# Patient Record
Sex: Female | Born: 1940 | Race: White | Hispanic: No | Smoking: Never smoker
Health system: Southern US, Community
[De-identification: ages and names within clinical notes are randomized; demographics above are authoritative.]

## PROBLEM LIST (undated history)

## (undated) DIAGNOSIS — E119 Type 2 diabetes mellitus without complications: Secondary | ICD-10-CM

## (undated) DIAGNOSIS — I10 Essential (primary) hypertension: Secondary | ICD-10-CM

## (undated) DIAGNOSIS — Z974 Presence of external hearing-aid: Secondary | ICD-10-CM

## (undated) DIAGNOSIS — H9190 Unspecified hearing loss, unspecified ear: Secondary | ICD-10-CM

## (undated) DIAGNOSIS — E785 Hyperlipidemia, unspecified: Secondary | ICD-10-CM

## (undated) DIAGNOSIS — I214 Non-ST elevation (NSTEMI) myocardial infarction: Secondary | ICD-10-CM

## (undated) DIAGNOSIS — R51 Headache: Secondary | ICD-10-CM

## (undated) DIAGNOSIS — Z972 Presence of dental prosthetic device (complete) (partial): Secondary | ICD-10-CM

## (undated) DIAGNOSIS — M1990 Unspecified osteoarthritis, unspecified site: Secondary | ICD-10-CM

## (undated) DIAGNOSIS — R519 Headache, unspecified: Secondary | ICD-10-CM

## (undated) DIAGNOSIS — N189 Chronic kidney disease, unspecified: Secondary | ICD-10-CM

## (undated) DIAGNOSIS — I509 Heart failure, unspecified: Secondary | ICD-10-CM

## (undated) HISTORY — DX: Chronic kidney disease, unspecified: N18.9

## (undated) HISTORY — DX: Non-ST elevation (NSTEMI) myocardial infarction: I21.4

## (undated) HISTORY — DX: Essential (primary) hypertension: I10

## (undated) HISTORY — PX: INJECTION KNEE: SHX2446

## (undated) HISTORY — DX: Heart failure, unspecified: I50.9

## (undated) HISTORY — DX: Hyperlipidemia, unspecified: E78.5

## (undated) HISTORY — PX: ABDOMINAL HYSTERECTOMY: SHX81

## (undated) HISTORY — PX: CARDIAC CATHETERIZATION: SHX172

## (undated) HISTORY — DX: Unspecified osteoarthritis, unspecified site: M19.90

## (undated) HISTORY — PX: BREAST BIOPSY: SHX20

---

## 1984-04-21 HISTORY — PX: ABDOMINAL HYSTERECTOMY: SHX81

## 2001-03-29 ENCOUNTER — Other Ambulatory Visit: Admission: RE | Admit: 2001-03-29 | Discharge: 2001-03-29 | Payer: Self-pay | Admitting: Family Medicine

## 2007-12-13 ENCOUNTER — Ambulatory Visit: Payer: Self-pay | Admitting: Family Medicine

## 2007-12-21 ENCOUNTER — Ambulatory Visit: Payer: Self-pay | Admitting: Family Medicine

## 2008-01-20 ENCOUNTER — Ambulatory Visit: Payer: Self-pay | Admitting: Family Medicine

## 2014-07-11 ENCOUNTER — Emergency Department (HOSPITAL_COMMUNITY): Admission: EM | Admit: 2014-07-11 | Discharge: 2014-07-11 | Discharge status: Absent without leave | Payer: Self-pay

## 2014-09-21 ENCOUNTER — Encounter
Admission: RE | Admit: 2014-09-21 | Discharge: 2014-09-21 | Disposition: A | Discharge status: Home / Self Care | Payer: PPO | Source: Ambulatory Visit | Attending: Ophthalmology | Admitting: Ophthalmology

## 2014-09-21 DIAGNOSIS — Z01810 Encounter for preprocedural cardiovascular examination: Secondary | ICD-10-CM | POA: Insufficient documentation

## 2014-09-21 DIAGNOSIS — I10 Essential (primary) hypertension: Secondary | ICD-10-CM | POA: Insufficient documentation

## 2014-09-21 DIAGNOSIS — H2511 Age-related nuclear cataract, right eye: Secondary | ICD-10-CM | POA: Insufficient documentation

## 2014-09-27 ENCOUNTER — Encounter: Payer: Self-pay | Admitting: *Deleted

## 2014-09-27 DIAGNOSIS — E119 Type 2 diabetes mellitus without complications: Secondary | ICD-10-CM | POA: Diagnosis not present

## 2014-09-27 DIAGNOSIS — H2511 Age-related nuclear cataract, right eye: Secondary | ICD-10-CM | POA: Diagnosis not present

## 2014-09-27 DIAGNOSIS — E780 Pure hypercholesterolemia: Secondary | ICD-10-CM | POA: Diagnosis not present

## 2014-09-27 DIAGNOSIS — G43909 Migraine, unspecified, not intractable, without status migrainosus: Secondary | ICD-10-CM | POA: Diagnosis not present

## 2014-09-27 DIAGNOSIS — Z885 Allergy status to narcotic agent status: Secondary | ICD-10-CM | POA: Diagnosis not present

## 2014-09-27 DIAGNOSIS — Z90710 Acquired absence of both cervix and uterus: Secondary | ICD-10-CM | POA: Diagnosis not present

## 2014-09-27 DIAGNOSIS — H9190 Unspecified hearing loss, unspecified ear: Secondary | ICD-10-CM | POA: Diagnosis not present

## 2014-10-03 ENCOUNTER — Encounter
Admission: RE | Disposition: A | Discharge status: Home / Self Care | Payer: Self-pay | Source: Ambulatory Visit | Attending: Ophthalmology

## 2014-10-03 ENCOUNTER — Ambulatory Visit
Admission: RE | Admit: 2014-10-03 | Discharge: 2014-10-03 | Disposition: A | Discharge status: Home / Self Care | Payer: PPO | Source: Ambulatory Visit | Attending: Ophthalmology | Admitting: Ophthalmology

## 2014-10-03 ENCOUNTER — Ambulatory Visit: Payer: PPO | Admitting: Certified Registered Nurse Anesthetist

## 2014-10-03 ENCOUNTER — Encounter: Payer: Self-pay | Admitting: Ophthalmology

## 2014-10-03 DIAGNOSIS — Z885 Allergy status to narcotic agent status: Secondary | ICD-10-CM | POA: Insufficient documentation

## 2014-10-03 DIAGNOSIS — G43909 Migraine, unspecified, not intractable, without status migrainosus: Secondary | ICD-10-CM | POA: Insufficient documentation

## 2014-10-03 DIAGNOSIS — H2511 Age-related nuclear cataract, right eye: Secondary | ICD-10-CM | POA: Insufficient documentation

## 2014-10-03 DIAGNOSIS — E119 Type 2 diabetes mellitus without complications: Secondary | ICD-10-CM | POA: Insufficient documentation

## 2014-10-03 DIAGNOSIS — E780 Pure hypercholesterolemia: Secondary | ICD-10-CM | POA: Insufficient documentation

## 2014-10-03 DIAGNOSIS — Z90710 Acquired absence of both cervix and uterus: Secondary | ICD-10-CM | POA: Insufficient documentation

## 2014-10-03 DIAGNOSIS — H9190 Unspecified hearing loss, unspecified ear: Secondary | ICD-10-CM | POA: Insufficient documentation

## 2014-10-03 HISTORY — DX: Headache: R51

## 2014-10-03 HISTORY — DX: Type 2 diabetes mellitus without complications: E11.9

## 2014-10-03 HISTORY — DX: Headache, unspecified: R51.9

## 2014-10-03 HISTORY — PX: CATARACT EXTRACTION W/PHACO: SHX586

## 2014-10-03 HISTORY — DX: Unspecified hearing loss, unspecified ear: H91.90

## 2014-10-03 LAB — GLUCOSE, CAPILLARY: Glucose-Capillary: 193 mg/dL — ABNORMAL HIGH (ref 65–99)

## 2014-10-03 SURGERY — PHACOEMULSIFICATION, CATARACT, WITH IOL INSERTION
Anesthesia: Monitor Anesthesia Care | Site: Eye | Laterality: Right | Wound class: Clean

## 2014-10-03 MED ORDER — NA CHONDROIT SULF-NA HYALURON 40-17 MG/ML IO SOLN
INTRAOCULAR | Status: AC
Start: 2014-10-03 — End: 2014-10-03
  Filled 2014-10-03: qty 1

## 2014-10-03 MED ORDER — EPINEPHRINE HCL 1 MG/ML IJ SOLN
INTRAOCULAR | Status: DC | PRN
  Administered 2014-10-03: 200 mL

## 2014-10-03 MED ORDER — TETRACAINE HCL 0.5 % OP SOLN
1.0000 [drp] | OPHTHALMIC | Status: DC | PRN
  Administered 2014-10-03: 1 [drp] via OPHTHALMIC

## 2014-10-03 MED ORDER — MOXIFLOXACIN HCL 0.5 % OP SOLN - NO CHARGE
OPHTHALMIC | Status: DC | PRN
  Administered 2014-10-03: 1 [drp] via OPHTHALMIC

## 2014-10-03 MED ORDER — LIDOCAINE HCL (PF) 4 % IJ SOLN
INTRAMUSCULAR | Status: AC
  Filled 2014-10-03: qty 5

## 2014-10-03 MED ORDER — CEFUROXIME OPHTHALMIC INJECTION 1 MG/0.1 ML
INJECTION | OPHTHALMIC | Status: DC | PRN
  Administered 2014-10-03: .1 mL via INTRACAMERAL

## 2014-10-03 MED ORDER — LIDOCAINE HCL (PF) 1 % IJ SOLN
INTRAOCULAR | Status: DC | PRN
  Administered 2014-10-03: .5 mL via OPHTHALMIC

## 2014-10-03 MED ORDER — POVIDONE-IODINE 5 % OP SOLN
1.0000 "application " | OPHTHALMIC | Status: AC | PRN
  Administered 2014-10-03: 1 via OPHTHALMIC

## 2014-10-03 MED ORDER — EPINEPHRINE HCL 1 MG/ML IJ SOLN
INTRAMUSCULAR | Status: AC
  Filled 2014-10-03: qty 1

## 2014-10-03 MED ORDER — FENTANYL CITRATE (PF) 100 MCG/2ML IJ SOLN
INTRAMUSCULAR | Status: DC | PRN
  Administered 2014-10-03: 50 ug via INTRAVENOUS

## 2014-10-03 MED ORDER — MIDAZOLAM HCL 2 MG/2ML IJ SOLN
INTRAMUSCULAR | Status: DC | PRN
  Administered 2014-10-03 (×2): 0.5 mg via INTRAVENOUS

## 2014-10-03 MED ORDER — ARMC OPHTHALMIC DILATING GEL
1.0000 "application " | OPHTHALMIC | Status: AC | PRN
  Administered 2014-10-03 (×2): 1 via OPHTHALMIC

## 2014-10-03 MED ORDER — SODIUM CHLORIDE 0.9 % IV SOLN
INTRAVENOUS | Status: DC
  Administered 2014-10-03: 11:00:00 via INTRAVENOUS

## 2014-10-03 MED ORDER — EPINEPHRINE HCL 1 MG/ML IJ SOLN
INTRAMUSCULAR | Status: AC
Start: 2014-10-03 — End: 2014-10-03
  Filled 2014-10-03: qty 1

## 2014-10-03 MED ORDER — CEFUROXIME OPHTHALMIC INJECTION 1 MG/0.1 ML
INJECTION | OPHTHALMIC | Status: AC
  Filled 2014-10-03: qty 0.1

## 2014-10-03 MED ORDER — LIDOCAINE HCL 3.5 % OP GEL
OPHTHALMIC | Status: AC
  Administered 2014-10-03: 1 via OPHTHALMIC
  Filled 2014-10-03: qty 1

## 2014-10-03 MED ORDER — LIDOCAINE HCL 3.5 % OP GEL
1.0000 "application " | Freq: Once | OPHTHALMIC | Status: AC
  Administered 2014-10-03: 1 via OPHTHALMIC

## 2014-10-03 MED ORDER — PHENYLEPHRINE HCL 10 % OP SOLN
OPHTHALMIC | Status: AC
  Administered 2014-10-03: 2 [drp] via OPHTHALMIC
  Filled 2014-10-03: qty 5

## 2014-10-03 MED ORDER — CARBACHOL 0.01 % IO SOLN
INTRAOCULAR | Status: DC | PRN
  Administered 2014-10-03: 0.5 mL via INTRAOCULAR

## 2014-10-03 MED ORDER — PHENYLEPHRINE HCL 10 % OP SOLN
2.0000 [drp] | Freq: Once | OPHTHALMIC | Status: AC
  Administered 2014-10-03: 2 [drp] via OPHTHALMIC

## 2014-10-03 SURGICAL SUPPLY — 21 items
CANNULA ANT/CHMB 27GA (MISCELLANEOUS) ×2 IMPLANT
GLOVE BIO SURGEON STRL SZ8 (GLOVE) ×2 IMPLANT
GLOVE BIOGEL M 6.5 STRL (GLOVE) ×2 IMPLANT
GLOVE SURG LX 8.0 MICRO (GLOVE) ×1
GLOVE SURG LX STRL 8.0 MICRO (GLOVE) ×1 IMPLANT
GOWN STRL REUS W/ TWL LRG LVL3 (GOWN DISPOSABLE) ×2 IMPLANT
GOWN STRL REUS W/TWL LRG LVL3 (GOWN DISPOSABLE) ×2
LENS IOL TECNIS 23.0 (Intraocular Lens) ×2 IMPLANT
LENS IOL TECNIS MONO 1P 23.0 (Intraocular Lens) ×1 IMPLANT
PACK CATARACT (MISCELLANEOUS) ×2 IMPLANT
PACK CATARACT BRASINGTON LX (MISCELLANEOUS) ×2 IMPLANT
PACK EYE AFTER SURG (MISCELLANEOUS) ×2 IMPLANT
RING MALYGIN (MISCELLANEOUS) ×2 IMPLANT
SOL BSS BAG (MISCELLANEOUS) ×2
SOL PREP PVP 2OZ (MISCELLANEOUS) ×2
SOLUTION BSS BAG (MISCELLANEOUS) ×1 IMPLANT
SOLUTION PREP PVP 2OZ (MISCELLANEOUS) ×1 IMPLANT
SYR 5ML LL (SYRINGE) ×2 IMPLANT
SYR TB 1ML 27GX1/2 LL (SYRINGE) ×2 IMPLANT
WATER STERILE IRR 1000ML POUR (IV SOLUTION) ×2 IMPLANT
WIPE NON LINTING 3.25X3.25 (MISCELLANEOUS) ×2 IMPLANT

## 2014-10-03 NOTE — Transfer of Care (Addendum)
Immediate Anesthesia Transfer of Care Note  Patient: Margaret Payne  Procedure(s) Performed: Procedure(s) with comments: CATARACT EXTRACTION PHACO AND INTRAOCULAR LENS PLACEMENT (IOC) (Right) - [place] 00:57 AP% 16.7 CDE 9.52 Fluid pack [id number] H  Patient Location: Short Stay  Anesthesia Type:MAC  Level of Consciousness: awake, alert  and oriented  Airway & Oxygen Therapy: Patient Spontanous Breathing  Post-op Assessment: Report given to RN and Post -op Vital signs reviewed and stable  Post vital signs: Reviewed and stable  Last Vitals:  Filed Vitals:   10/03/14 1204  BP: 155/63  Pulse:   Temp: 35.8 C  Resp: 22   Pulse 60 Complications: No apparent anesthesia complications

## 2014-10-03 NOTE — Anesthesia Preprocedure Evaluation (Signed)
Anesthesia Evaluation  Patient identified by MRN, date of birth, ID band Patient awake    Reviewed: Allergy & Precautions, NPO status , Patient's Chart, lab work & pertinent test results  Airway Mallampati: III   Neck ROM: Full    Dental  (+) Upper Dentures   Pulmonary          Cardiovascular     Neuro/Psych    GI/Hepatic GERD-  ,  Endo/Other  diabetes, Oral Hypoglycemic Agents, Insulin Dependent  Renal/GU      Musculoskeletal   Abdominal   Peds  Hematology   Anesthesia Other Findings   Reproductive/Obstetrics                             Anesthesia Physical Anesthesia Plan  ASA: III  Anesthesia Plan: MAC   Post-op Pain Management:    Induction: Intravenous  Airway Management Planned: Nasal Cannula  Additional Equipment:   Intra-op Plan:   Post-operative Plan:   Informed Consent: I have reviewed the patients History and Physical, chart, labs and discussed the procedure including the risks, benefits and alternatives for the proposed anesthesia with the patient or authorized representative who has indicated his/her understanding and acceptance.     Plan Discussed with:   Anesthesia Plan Comments:         Anesthesia Quick Evaluation

## 2014-10-03 NOTE — Anesthesia Postprocedure Evaluation (Signed)
  Anesthesia Post-op Note  Patient: Margaret Payne  Procedure(s) Performed: Procedure(s) with comments: CATARACT EXTRACTION PHACO AND INTRAOCULAR LENS PLACEMENT (IOC) (Right) - [place] 00:57 AP% 16.7 CDE 9.52 Fluid pack [id number] H  Anesthesia type:MAC  Patient location: PACU  Post pain: Pain level controlled  Post assessment: Post-op Vital signs reviewed, Patient's Cardiovascular Status Stable, Respiratory Function Stable, Patent Airway and No signs of Nausea or vomiting  Post vital signs: Reviewed and stable  Last Vitals:  Filed Vitals:   10/03/14 1204  BP: 155/63  Pulse:   Temp: 35.8 C  Resp: 22    Level of consciousness: awake, alert  and patient cooperative  Complications: No apparent anesthesia complications

## 2014-10-03 NOTE — Anesthesia Procedure Notes (Signed)
Procedure Name: MAC Performed by: Demetrius Charity Pre-anesthesia Checklist: Patient identified, Emergency Drugs available, Suction available, Patient being monitored and Timeout performed Oxygen Delivery Method: Nasal cannula Placement Confirmation: positive ETCO2

## 2014-10-03 NOTE — Op Note (Signed)
PREOPERATIVE DIAGNOSIS:  Nuclear sclerotic cataract of the right eye.   POSTOPERATIVE DIAGNOSIS: nuclear sclerotic cataract right eye   OPERATIVE PROCEDURE:  Procedure(s): CATARACT EXTRACTION PHACO AND INTRAOCULAR LENS PLACEMENT (IOC)   SURGEON:  Birder Robson, MD.   ANESTHESIA:  Anesthesiologist: Gunnar Fusi, MD CRNA: Demetrius Charity, CRNA  1.      Managed anesthesia care. 2.      Topical tetracaine drops followed by 2% Xylocaine jelly applied in the preoperative holding area.       3.  0.2 ml of epi-Shugarcaine was  placed in the anterior chamber following the paracentesis.    COMPLICATIONS:  None.   TECHNIQUE:   Stop and chop   DESCRIPTION OF PROCEDURE:  The patient was examined and consented in the preoperative holding area where the aforementioned topical anesthesia was applied to the right eye and then brought back to the Operating Room where the right eye was prepped and draped in the usual sterile ophthalmic fashion and a lid speculum was placed. A paracentesis was created with the side port blade and the anterior chamber was filled with viscoelastic. A near clear corneal incision was performed with the steel keratome. Viscoelastic was used to raise the pupil margin.  A  Malyugin ring was placed as the pupil would not achieve sufficient pharmacologic dilation to undergo cataract extraction safely.( The ring was removed atraumatically following insertion of the IOL.)  A continuous curvilinear capsulorrhexis was performed with a cystotome followed by the capsulorrhexis forceps. Hydrodissection and hydrodelineation were carried out with BSS on a blunt cannula. The lens was removed in a stop and chop  technique and the remaining cortical material was removed with the irrigation-aspiration handpiece. The capsular bag was inflated with viscoelastic and the Technis ZCB00  lens was placed in the capsular bag without complication. The remaining viscoelastic was removed from the eye  with the irrigation-aspiration handpiece. The wounds were hydrated. The anterior chamber was flushed with Miostat and the eye was inflated to physiologic pressure. 0.1 mL of cefuroxime concentration 10 mg/mL was placed in the anterior chamber. The wounds were found to be water tight. The eye was dressed with Vigamox. The patient was given protective glasses to wear throughout the day and a shield with which to sleep tonight. The patient was also given drops with which to begin a drop regimen today and will follow-up with me in one day.  Implant Name Type Inv. Item Serial No. Manufacturer Lot No. LRB No. Used  LENS IMPL INTRAOC [id number] 23.0 - [id number] Intraocular Lens LENS IMPL INTRAOC ZCB00 23.0 [id number]   Right 1   Procedure(s) with comments: CATARACT EXTRACTION PHACO AND INTRAOCULAR LENS PLACEMENT (IOC) (Right) - [place] 00:57 AP% 16.7 CDE 9.52 Fluid pack [id number] H  Electronically signed: Luiscarlos Kaczmarczyk LOUIS 10/03/2014 12:03 PM

## 2014-10-03 NOTE — Discharge Instructions (Addendum)
See cataract post op handout  Eye Surgery Discharge Instructions  Expect mild scratchy sensation or mild soreness. DO NOT RUB YOUR EYE!  The day of surgery:  Minimal physical activity, but bed rest is not required  No reading, computer work, or close hand work  No bending, lifting, or straining.  May watch TV  For 24 hours:  No driving, legal decisions, or alcoholic beverages  Safety precautions  Eat anything you prefer: It is better to start with liquids, then soup then solid foods.  _____ Eye patch should be worn until postoperative exam tomorrow.  ____ Solar shield eyeglasses should be worn for comfort in the sunlight/patch while sleeping  Resume all regular medications including aspirin or Coumadin if these were discontinued prior to surgery. You may shower, bathe, shave, or wash your hair. Tylenol may be taken for mild discomfort.  Call your doctor if you experience significant pain, nausea, or vomiting, fever > 101 or other signs of infection. [phone] or [phone] Specific instructions:  Follow-up Information    Follow up with Tim Lair, MD.   Specialty:  Ophthalmology   Why:  6/15 at 945   Contact information:   [place] [address] [phone]

## 2014-10-03 NOTE — H&P (Signed)
  All labs reviewed. Abnormal studies sent to patients PCP when indicated.  Previous H&P reviewed, patient examined, there are NO CHANGES.  Margaret Lizer LOUIS6/14/201611:18 AM

## 2015-05-11 DIAGNOSIS — E1122 Type 2 diabetes mellitus with diabetic chronic kidney disease: Secondary | ICD-10-CM | POA: Diagnosis not present

## 2015-05-11 DIAGNOSIS — Z794 Long term (current) use of insulin: Secondary | ICD-10-CM | POA: Diagnosis not present

## 2015-05-11 DIAGNOSIS — N184 Chronic kidney disease, stage 4 (severe): Secondary | ICD-10-CM | POA: Diagnosis not present

## 2015-05-11 DIAGNOSIS — R809 Proteinuria, unspecified: Secondary | ICD-10-CM | POA: Diagnosis not present

## 2015-05-11 DIAGNOSIS — E1129 Type 2 diabetes mellitus with other diabetic kidney complication: Secondary | ICD-10-CM | POA: Diagnosis not present

## 2015-06-01 DIAGNOSIS — R809 Proteinuria, unspecified: Secondary | ICD-10-CM | POA: Diagnosis not present

## 2015-06-01 DIAGNOSIS — E1129 Type 2 diabetes mellitus with other diabetic kidney complication: Secondary | ICD-10-CM | POA: Diagnosis not present

## 2015-06-01 DIAGNOSIS — Z794 Long term (current) use of insulin: Secondary | ICD-10-CM | POA: Diagnosis not present

## 2015-06-01 DIAGNOSIS — E1121 Type 2 diabetes mellitus with diabetic nephropathy: Secondary | ICD-10-CM | POA: Diagnosis not present

## 2015-06-01 DIAGNOSIS — N184 Chronic kidney disease, stage 4 (severe): Secondary | ICD-10-CM | POA: Diagnosis not present

## 2015-06-01 DIAGNOSIS — I10 Essential (primary) hypertension: Secondary | ICD-10-CM | POA: Diagnosis not present

## 2015-06-01 DIAGNOSIS — E1122 Type 2 diabetes mellitus with diabetic chronic kidney disease: Secondary | ICD-10-CM | POA: Diagnosis not present

## 2015-06-14 DIAGNOSIS — E113293 Type 2 diabetes mellitus with mild nonproliferative diabetic retinopathy without macular edema, bilateral: Secondary | ICD-10-CM | POA: Diagnosis not present

## 2015-08-27 DIAGNOSIS — N184 Chronic kidney disease, stage 4 (severe): Secondary | ICD-10-CM | POA: Diagnosis not present

## 2015-08-27 DIAGNOSIS — Z794 Long term (current) use of insulin: Secondary | ICD-10-CM | POA: Diagnosis not present

## 2015-08-27 DIAGNOSIS — E1122 Type 2 diabetes mellitus with diabetic chronic kidney disease: Secondary | ICD-10-CM | POA: Diagnosis not present

## 2015-09-03 DIAGNOSIS — E11319 Type 2 diabetes mellitus with unspecified diabetic retinopathy without macular edema: Secondary | ICD-10-CM | POA: Diagnosis not present

## 2015-09-03 DIAGNOSIS — N184 Chronic kidney disease, stage 4 (severe): Secondary | ICD-10-CM | POA: Diagnosis not present

## 2015-09-03 DIAGNOSIS — R809 Proteinuria, unspecified: Secondary | ICD-10-CM | POA: Diagnosis not present

## 2015-09-03 DIAGNOSIS — E1122 Type 2 diabetes mellitus with diabetic chronic kidney disease: Secondary | ICD-10-CM | POA: Diagnosis not present

## 2015-09-03 DIAGNOSIS — Z794 Long term (current) use of insulin: Secondary | ICD-10-CM | POA: Diagnosis not present

## 2015-09-03 DIAGNOSIS — E1165 Type 2 diabetes mellitus with hyperglycemia: Secondary | ICD-10-CM | POA: Diagnosis not present

## 2015-10-09 ENCOUNTER — Telehealth: Payer: Self-pay | Admitting: Family Medicine

## 2015-10-09 NOTE — Telephone Encounter (Signed)
Pt calling stating she is a Dr. Sharyon Medicus pt but hasn't been in the office in awhile (2015), but would like to know if Dr. Caryn Section would take her on as her PCP, pt states that she gets her blood work done thought her A1C Dr and if Dr. Caryn Section is going to see her she wants to know if she has to have a appointment for a CPE set up? Which I explained to her more that likely she would have to come in for a CPE, pt was fine with that but would like to know for sure on what to do and wants to know what all is included in a CPE.  Pt states she does have a hearing problem pt wears a hear aide.Please call pt back @ [phone]. Thanks CC

## 2015-10-09 NOTE — Telephone Encounter (Signed)
Called pt and scheduled CPE/Wellness visit with Dr. Caryn Section for Thursday 10/11/15 @ 10 am. Thanks TNP

## 2015-10-09 NOTE — Telephone Encounter (Signed)
Please advise 

## 2015-10-09 NOTE — Telephone Encounter (Signed)
OK to schedule for wellness visit.

## 2015-10-10 DIAGNOSIS — J309 Allergic rhinitis, unspecified: Secondary | ICD-10-CM | POA: Insufficient documentation

## 2015-10-10 DIAGNOSIS — E1121 Type 2 diabetes mellitus with diabetic nephropathy: Secondary | ICD-10-CM | POA: Insufficient documentation

## 2015-10-10 DIAGNOSIS — Z8669 Personal history of other diseases of the nervous system and sense organs: Secondary | ICD-10-CM | POA: Insufficient documentation

## 2015-10-10 DIAGNOSIS — Z794 Long term (current) use of insulin: Secondary | ICD-10-CM | POA: Insufficient documentation

## 2015-10-10 DIAGNOSIS — I10 Essential (primary) hypertension: Secondary | ICD-10-CM | POA: Insufficient documentation

## 2015-10-10 DIAGNOSIS — E669 Obesity, unspecified: Secondary | ICD-10-CM | POA: Insufficient documentation

## 2015-10-10 DIAGNOSIS — E1122 Type 2 diabetes mellitus with diabetic chronic kidney disease: Secondary | ICD-10-CM

## 2015-10-11 ENCOUNTER — Ambulatory Visit (INDEPENDENT_AMBULATORY_CARE_PROVIDER_SITE_OTHER): Payer: PPO | Admitting: Family Medicine

## 2015-10-11 ENCOUNTER — Encounter: Payer: Self-pay | Admitting: Family Medicine

## 2015-10-11 VITALS — BP 142/80 | HR 61 | Temp 98.3°F | Resp 16 | Ht 60.75 in | Wt 178.0 lb

## 2015-10-11 DIAGNOSIS — Z1212 Encounter for screening for malignant neoplasm of rectum: Secondary | ICD-10-CM

## 2015-10-11 DIAGNOSIS — E119 Type 2 diabetes mellitus without complications: Secondary | ICD-10-CM

## 2015-10-11 DIAGNOSIS — E2839 Other primary ovarian failure: Secondary | ICD-10-CM | POA: Diagnosis not present

## 2015-10-11 DIAGNOSIS — E559 Vitamin D deficiency, unspecified: Secondary | ICD-10-CM | POA: Diagnosis not present

## 2015-10-11 DIAGNOSIS — Z0000 Encounter for general adult medical examination without abnormal findings: Secondary | ICD-10-CM

## 2015-10-11 DIAGNOSIS — Z1211 Encounter for screening for malignant neoplasm of colon: Secondary | ICD-10-CM

## 2015-10-11 DIAGNOSIS — I10 Essential (primary) hypertension: Secondary | ICD-10-CM

## 2015-10-11 DIAGNOSIS — Z23 Encounter for immunization: Secondary | ICD-10-CM

## 2015-10-11 DIAGNOSIS — Z794 Long term (current) use of insulin: Secondary | ICD-10-CM | POA: Diagnosis not present

## 2015-10-11 NOTE — Progress Notes (Signed)
Patient: Margaret Payne, Female    DOB: 02-Mar-1941, 75 y.o.   MRN: [id number] Visit Date: 10/11/2015  Today's Provider: Lelon Huh, Payne   Chief Complaint  Patient presents with  . Medicare Wellness  . Hypertension    follow up  . Diabetes    follow up   Subjective:    Annual physical  Margaret Payne is a 75 y.o. female. She feels fairly well. She reports exercising 2 times a week. She reports she is sleeping well.  -----------------------------------------------------------  Hypertension, follow-up:  BP Readings from Last 3 Encounters:  10/03/14 155/63    She was last seen for hypertension 2 years ago.  She reports good compliance with treatment. Patients Endocrinologist has been refilling her medication. She is not having side effects.  She is exercising. She is adherent to low salt diet.   She is experiencing none.  Patient denies chest pain, chest pressure/discomfort, claudication, dyspnea, exertional chest pressure/discomfort, fatigue, irregular heart beat, lower extremity edema, near-syncope, orthopnea, palpitations, paroxysmal nocturnal dyspnea, syncope and tachypnea.   Cardiovascular risk factors include advanced age (older than 60 for men, 17 for women), diabetes mellitus and hypertension.  Use of agents associated with hypertension: none.     Weight trend: fluctuating a bit Wt Readings from Last 3 Encounters:  09/27/14 174 lb (78.926 kg)    Current diet: in general, a "healthy" diet    ------------------------------------------------------------------------  Follow up Diabetes: Patient was last seen for this problem 2 years ago. Patient was seen by Margaret Payne. Changes made during that visit includes referring patient to Endocrinology. Since that visit patient has been seeing Endocrinologist  Margaret Payne for Diabetes follow up.    Review of Systems  Constitutional: Negative for fever, chills and fatigue.  HENT: Positive for dental problem and  rhinorrhea. Negative for congestion, ear pain, sneezing and sore throat.   Eyes: Negative.  Negative for pain and redness.  Respiratory: Negative for cough, shortness of breath and wheezing.   Cardiovascular: Negative for chest pain and leg swelling.  Gastrointestinal: Negative for nausea, abdominal pain, diarrhea, constipation and blood in stool.  Endocrine: Negative for polydipsia and polyphagia.  Genitourinary: Negative.  Negative for dysuria, hematuria, flank pain, vaginal bleeding, vaginal discharge and pelvic pain.  Musculoskeletal: Negative for back pain, joint swelling, arthralgias and gait problem.  Skin: Negative for rash.  Allergic/Immunologic: Positive for environmental allergies.  Neurological: Negative.  Negative for dizziness, tremors, seizures, weakness, light-headedness, numbness and headaches.  Hematological: Negative for adenopathy.  Psychiatric/Behavioral: Negative.  Negative for behavioral problems, confusion and dysphoric mood. The patient is not nervous/anxious and is not hyperactive.     Social History   Social History  . Marital Status: Single    Spouse Name: N/A  . Number of Children: 1  . Years of Education: N/A   Occupational History  . Retired    Social History Main Topics  . Smoking status: Never Smoker   . Smokeless tobacco: Not on file  . Alcohol Use: No  . Drug Use: No  . Sexual Activity: Not on file   Other Topics Concern  . Not on file   Social History Narrative    Past Medical History  Diagnosis Date  . Headache     migrains  . Diabetes mellitus without complication ([place])   . HOH (hard of hearing)      Patient Active Problem List   Diagnosis Date Noted  . Allergic rhinitis 10/10/2015  . Diabetes  mellitus ([place]) 10/10/2015  . Hypertension 10/10/2015  . History of migraine headaches 10/10/2015  . Obesity 10/10/2015    Past Surgical History  Procedure Laterality Date  . Abdominal hysterectomy    . Breast biopsy    . Cataract  extraction w/phaco Right 10/03/2014    Procedure: CATARACT EXTRACTION PHACO AND INTRAOCULAR LENS PLACEMENT (IOC);  Surgeon: Birder Robson, Payne;  Location: [facility] ORS;  Service: Ophthalmology;  Laterality: Right;  [place] 00:57 AP% 16.7 CDE 9.52 Fluid pack [id number]    Her family history is not on file.    Current Meds  Medication Sig  . glipiZIDE (GLUCOTROL XL) 10 MG 24 hr tablet Take 10 mg by mouth daily.   . insulin aspart protamine- aspart (NOVOLOG MIX 70/30) (70-30) 100 UNIT/ML injection Inject 46 Units into the skin 2 (two) times daily with a meal.   . LINAGLIPTIN PO Take 1 tablet by mouth daily.  Marland Kitchen lisinopril (PRINIVIL,ZESTRIL) 40 MG tablet Take 40 mg by mouth daily.  . propranolol (INDERAL) 20 MG tablet Take 20 mg by mouth 2 (two) times daily.  . [DISCONTINUED] lisinopril (PRINIVIL,ZESTRIL) 10 MG tablet Take 10 mg by mouth daily.  . [DISCONTINUED] metFORMIN (GLUMETZA) 1000 MG (MOD) 24 hr tablet Take 1,000 mg by mouth 2 (two) times daily with a meal.  . [DISCONTINUED] Multiple Vitamins-Minerals (MULTIVITAMIN ADULT PO) Take 1 tablet by mouth daily.  . [DISCONTINUED] Vitamin D, Ergocalciferol, (DRISDOL) 50000 units CAPS capsule Take 1 capsule by mouth.    Patient Care Team: Margaret Rana, Payne as PCP - General (Family Medicine)    Objective:   Vitals: BP 142/80 mmHg  Pulse 61  Temp(Src) 98.3 F (36.8 C) (Oral)  Resp 16  Ht 5' 0.75" (1.543 m)  Wt 178 lb (80.74 kg)  BMI 33.91 kg/m2  SpO2 99%  Physical Exam   General Appearance:    Alert, cooperative, no distress, appears stated age  Head:    Normocephalic, without obvious abnormality, atraumatic  Eyes:    PERRL, conjunctiva/corneas clear, EOM's intact, fundi    benign, both eyes  Ears:    Normal TM's and external ear canals, both ears  Nose:   Nares normal, septum midline, mucosa normal, no drainage    or sinus tenderness  Throat:   Lips, mucosa, and tongue normal; teeth and gums normal  Neck:   Supple, symmetrical,  trachea midline, no adenopathy;    thyroid:  no enlargement/tenderness/nodules; no carotid   bruit or JVD  Back:     Symmetric, no curvature, ROM normal, no CVA tenderness  Lungs:     Clear to auscultation bilaterally, respirations unlabored  Chest Wall:    No tenderness or deformity   Heart:    Regular rate and rhythm, S1 and S2 normal, no murmur, rub   or gallop  Breast Exam:    normal appearance, no masses or tenderness  Abdomen:     Soft, non-tender, bowel sounds active all four quadrants,    no masses, no organomegaly  Pelvic:    deferred  Extremities:   Extremities normal, atraumatic, no cyanosis or edema  Pulses:   2+ and symmetric all extremities  Skin:   Skin color, texture, turgor normal, no rashes or lesions  Lymph nodes:   Cervical, supraclavicular, and axillary nodes normal  Neurologic:   CNII-XII intact, normal strength, sensation and reflexes    throughout    Activities of Daily Living In your present state of health, do you have any difficulty performing the following activities:  10/11/2015  Hearing? Y  Vision? N  Difficulty concentrating or making decisions? N  Walking or climbing stairs? N  Dressing or bathing? N  Doing errands, shopping? N    Fall Risk Assessment Fall Risk  10/11/2015  Falls in the past year? No     Depression Screen PHQ 2/9 Scores 10/11/2015  PHQ - 2 Score 0  PHQ- 9 Score 0    Cognitive Testing - 6-CIT  Correct? Score   What year is it? yes 0 0 or 4  What month is it? yes 0 0 or 3  Memorize:    Pia Mau,  43,  [address],  [place],      What time is it? (within 1 hour) yes 0 0 or 3  Count backwards from 20 yes 0 0, 2, or 4  Name the months of the year yes 0 0, 2, or 4  Repeat name & address above yes 0 0, 2, 4, 6, 8, or 10       TOTAL SCORE  0/28   Interpretation:  Normal  Normal (0-7) Abnormal (8-28)     Audit-C Alcohol Use Screening  Question Answer Points  How often do you have alcoholic drink? never 0  On days you do  drink alcohol, how many drinks do you typically consume? n/a 0  How oftey will you drink 6 or more in a total? never 0  Total Score:  0   A score of 3 or more in women, and 4 or more in men indicates increased risk for alcohol abuse, EXCEPT if all of the points are from question 1.   Current Exercise Habits: Home exercise routine, Time (Minutes): 35, Frequency (Times/Week): 2, Weekly Exercise (Minutes/Week): 70, Intensity: Mild Exercise limited by: None identified   Assessment & Plan:     Annual Physical Reviewed patient's Family Medical History Reviewed and updated list of patient's medical providers Assessment of cognitive impairment was done Assessed patient's functional ability Established a written schedule for health screening [place] Completed and Reviewed  Exercise Activities and Dietary recommendations Goals    None      Immunization History  Administered Date(s) Administered  . Zoster 04/17/2008    Health Maintenance  Topic Date Due  . HEMOGLOBIN A1C  Sep 14, 1941  . FOOT EXAM  10/20/1951  . OPHTHALMOLOGY EXAM  10/20/1951  . TETANUS/TDAP  10/19/1960  . MAMMOGRAM  10/20/1991  . COLONOSCOPY  10/20/1991  . DEXA SCAN  10/20/2006  . INFLUENZA VACCINE  11/20/2015  . PNA vac Low Risk Adult (2 of 2 - PPSV23) 10/10/2016  . ZOSTAVAX  Completed      Discussed health benefits of physical activity, and encouraged her to engage in regular exercise appropriate for her age and condition.    ------------------------------------------------------------------------------------------------------------ 1. Annual physical exam   2. Essential hypertension Well controlled.  Continue current medications.    3. Type 2 diabetes mellitus without complication, with long-term current use of insulin (HCC) Continue regular follow up Margaret Payne  4. Vitamin D deficiency   5. Estrogen deficiency  - DG Bone Density; Future  6. Need for pneumococcal  vaccination  - Pneumococcal conjugate vaccine 13-valent  7. Screening for colon cancer  - Cologuard  8. Screening for rectal cancer  - Cologuard    Margaret Payne  [facility]

## 2015-11-14 ENCOUNTER — Ambulatory Visit
Admission: RE | Admit: 2015-11-14 | Discharge: 2015-11-14 | Disposition: A | Discharge status: Home / Self Care | Payer: PPO | Source: Ambulatory Visit | Attending: Family Medicine | Admitting: Family Medicine

## 2015-11-14 DIAGNOSIS — M8588 Other specified disorders of bone density and structure, other site: Secondary | ICD-10-CM | POA: Diagnosis not present

## 2015-11-14 DIAGNOSIS — M85852 Other specified disorders of bone density and structure, left thigh: Secondary | ICD-10-CM | POA: Diagnosis not present

## 2015-11-14 DIAGNOSIS — E2839 Other primary ovarian failure: Secondary | ICD-10-CM | POA: Insufficient documentation

## 2015-11-21 ENCOUNTER — Encounter: Payer: Self-pay | Admitting: Family Medicine

## 2015-11-21 DIAGNOSIS — M8580 Other specified disorders of bone density and structure, unspecified site: Secondary | ICD-10-CM | POA: Insufficient documentation

## 2015-11-21 DIAGNOSIS — M810 Age-related osteoporosis without current pathological fracture: Secondary | ICD-10-CM | POA: Insufficient documentation

## 2015-11-26 ENCOUNTER — Encounter: Payer: Self-pay | Admitting: Family Medicine

## 2015-11-28 DIAGNOSIS — E1165 Type 2 diabetes mellitus with hyperglycemia: Secondary | ICD-10-CM | POA: Diagnosis not present

## 2015-11-28 DIAGNOSIS — Z794 Long term (current) use of insulin: Secondary | ICD-10-CM | POA: Diagnosis not present

## 2015-12-05 DIAGNOSIS — E1122 Type 2 diabetes mellitus with diabetic chronic kidney disease: Secondary | ICD-10-CM | POA: Diagnosis not present

## 2015-12-05 DIAGNOSIS — Z794 Long term (current) use of insulin: Secondary | ICD-10-CM | POA: Diagnosis not present

## 2015-12-05 DIAGNOSIS — E1165 Type 2 diabetes mellitus with hyperglycemia: Secondary | ICD-10-CM | POA: Diagnosis not present

## 2015-12-05 DIAGNOSIS — N184 Chronic kidney disease, stage 4 (severe): Secondary | ICD-10-CM | POA: Diagnosis not present

## 2015-12-05 DIAGNOSIS — E11319 Type 2 diabetes mellitus with unspecified diabetic retinopathy without macular edema: Secondary | ICD-10-CM | POA: Diagnosis not present

## 2015-12-13 DIAGNOSIS — E113293 Type 2 diabetes mellitus with mild nonproliferative diabetic retinopathy without macular edema, bilateral: Secondary | ICD-10-CM | POA: Diagnosis not present

## 2015-12-13 LAB — HM DIABETES EYE EXAM

## 2016-02-29 DIAGNOSIS — E1165 Type 2 diabetes mellitus with hyperglycemia: Secondary | ICD-10-CM | POA: Diagnosis not present

## 2016-02-29 DIAGNOSIS — Z794 Long term (current) use of insulin: Secondary | ICD-10-CM | POA: Diagnosis not present

## 2016-03-03 DIAGNOSIS — E1165 Type 2 diabetes mellitus with hyperglycemia: Secondary | ICD-10-CM | POA: Diagnosis not present

## 2016-03-03 DIAGNOSIS — Z794 Long term (current) use of insulin: Secondary | ICD-10-CM | POA: Diagnosis not present

## 2016-03-07 DIAGNOSIS — N184 Chronic kidney disease, stage 4 (severe): Secondary | ICD-10-CM | POA: Diagnosis not present

## 2016-03-07 DIAGNOSIS — Z794 Long term (current) use of insulin: Secondary | ICD-10-CM | POA: Diagnosis not present

## 2016-03-07 DIAGNOSIS — E1122 Type 2 diabetes mellitus with diabetic chronic kidney disease: Secondary | ICD-10-CM | POA: Diagnosis not present

## 2016-03-07 DIAGNOSIS — E11319 Type 2 diabetes mellitus with unspecified diabetic retinopathy without macular edema: Secondary | ICD-10-CM | POA: Diagnosis not present

## 2016-03-07 DIAGNOSIS — R809 Proteinuria, unspecified: Secondary | ICD-10-CM | POA: Diagnosis not present

## 2016-03-07 DIAGNOSIS — E1165 Type 2 diabetes mellitus with hyperglycemia: Secondary | ICD-10-CM | POA: Diagnosis not present

## 2016-06-02 DIAGNOSIS — E1165 Type 2 diabetes mellitus with hyperglycemia: Secondary | ICD-10-CM | POA: Diagnosis not present

## 2016-06-02 DIAGNOSIS — Z794 Long term (current) use of insulin: Secondary | ICD-10-CM | POA: Diagnosis not present

## 2016-06-09 DIAGNOSIS — Z794 Long term (current) use of insulin: Secondary | ICD-10-CM | POA: Diagnosis not present

## 2016-06-09 DIAGNOSIS — E1121 Type 2 diabetes mellitus with diabetic nephropathy: Secondary | ICD-10-CM | POA: Diagnosis not present

## 2016-06-09 DIAGNOSIS — R809 Proteinuria, unspecified: Secondary | ICD-10-CM | POA: Diagnosis not present

## 2016-06-09 DIAGNOSIS — E1165 Type 2 diabetes mellitus with hyperglycemia: Secondary | ICD-10-CM | POA: Diagnosis not present

## 2016-06-09 DIAGNOSIS — N184 Chronic kidney disease, stage 4 (severe): Secondary | ICD-10-CM | POA: Diagnosis not present

## 2016-06-09 DIAGNOSIS — E1129 Type 2 diabetes mellitus with other diabetic kidney complication: Secondary | ICD-10-CM | POA: Diagnosis not present

## 2016-06-09 DIAGNOSIS — E1122 Type 2 diabetes mellitus with diabetic chronic kidney disease: Secondary | ICD-10-CM | POA: Diagnosis not present

## 2016-06-09 DIAGNOSIS — E11319 Type 2 diabetes mellitus with unspecified diabetic retinopathy without macular edema: Secondary | ICD-10-CM | POA: Diagnosis not present

## 2016-09-01 DIAGNOSIS — Z794 Long term (current) use of insulin: Secondary | ICD-10-CM | POA: Diagnosis not present

## 2016-09-01 DIAGNOSIS — E1165 Type 2 diabetes mellitus with hyperglycemia: Secondary | ICD-10-CM | POA: Diagnosis not present

## 2016-09-01 LAB — LIPID PANEL
Cholesterol: 180 (ref 0–200)
HDL: 36 (ref 35–70)
LDL CALC: 117
Triglycerides: 137 (ref 40–160)

## 2016-09-01 LAB — HEMOGLOBIN A1C: Hemoglobin A1C: 7.6

## 2016-09-01 LAB — MICROALBUMIN, URINE: Microalb, Ur: 37.1

## 2016-09-08 DIAGNOSIS — Z794 Long term (current) use of insulin: Secondary | ICD-10-CM | POA: Diagnosis not present

## 2016-09-08 DIAGNOSIS — E785 Hyperlipidemia, unspecified: Secondary | ICD-10-CM | POA: Diagnosis not present

## 2016-09-08 DIAGNOSIS — E11319 Type 2 diabetes mellitus with unspecified diabetic retinopathy without macular edema: Secondary | ICD-10-CM | POA: Diagnosis not present

## 2016-09-08 DIAGNOSIS — R809 Proteinuria, unspecified: Secondary | ICD-10-CM | POA: Diagnosis not present

## 2016-09-08 DIAGNOSIS — E1165 Type 2 diabetes mellitus with hyperglycemia: Secondary | ICD-10-CM | POA: Diagnosis not present

## 2016-09-08 DIAGNOSIS — E1129 Type 2 diabetes mellitus with other diabetic kidney complication: Secondary | ICD-10-CM | POA: Diagnosis not present

## 2016-09-08 DIAGNOSIS — E1122 Type 2 diabetes mellitus with diabetic chronic kidney disease: Secondary | ICD-10-CM | POA: Diagnosis not present

## 2016-09-08 DIAGNOSIS — N184 Chronic kidney disease, stage 4 (severe): Secondary | ICD-10-CM | POA: Diagnosis not present

## 2016-10-09 ENCOUNTER — Encounter: Payer: Self-pay | Admitting: *Deleted

## 2016-11-07 ENCOUNTER — Ambulatory Visit (INDEPENDENT_AMBULATORY_CARE_PROVIDER_SITE_OTHER): Payer: PPO

## 2016-11-07 VITALS — BP 142/80 | HR 76 | Temp 99.3°F | Ht 61.0 in | Wt 184.0 lb

## 2016-11-07 DIAGNOSIS — Z0000 Encounter for general adult medical examination without abnormal findings: Secondary | ICD-10-CM | POA: Diagnosis not present

## 2016-11-07 NOTE — Progress Notes (Signed)
Subjective:   Margaret Payne is a 76 y.o. female who presents for Medicare Annual (Subsequent) preventive examination.  Review of Systems:  N/A  Cardiac Risk Factors include: advanced age (>105men, >56 women);diabetes mellitus;dyslipidemia;hypertension;obesity (BMI >30kg/m2)     Objective:     Vitals: BP (!) 142/80 (BP Location: Left Arm)   Pulse 76   Temp 99.3 F (37.4 C) (Oral)   Ht 5\' 1"  (1.549 m)   Wt 184 lb (83.5 kg)   BMI 34.77 kg/m   Body mass index is 34.77 kg/m.   Tobacco History  Smoking Status  . Never Smoker  Smokeless Tobacco  . Never Used     Counseling given: Not Answered   Past Medical History:  Diagnosis Date  . Diabetes mellitus without complication ([place])   . Headache    migrains  . HOH (hard of hearing)    Past Surgical History:  Procedure Laterality Date  . ABDOMINAL HYSTERECTOMY    . BREAST BIOPSY    . CATARACT EXTRACTION W/PHACO Right 10/03/2014   Procedure: CATARACT EXTRACTION PHACO AND INTRAOCULAR LENS PLACEMENT (IOC);  Surgeon: Birder Robson, MD;  Location: [facility] ORS;  Service: Ophthalmology;  Laterality: Right;  [place] 00:57 AP% 16.7 CDE 9.52 Fluid pack [id number] H   Family History  Problem Relation Age of Onset  . Breast cancer Mother   . Emphysema Father   . Breast cancer Sister 30   History  Sexual Activity  . Sexual activity: Not on file    Outpatient Encounter Prescriptions as of 11/07/2016  Medication Sig  . Cholecalciferol (VITAMIN D3) 5000 units TABS Take 5,000 Units by mouth daily.  Marland Kitchen glipiZIDE (GLUCOTROL XL) 10 MG 24 hr tablet Take 10 mg by mouth daily.   . insulin aspart protamine- aspart (NOVOLOG MIX 70/30) (70-30) 100 UNIT/ML injection Inject into the skin 2 (two) times daily with a meal.   . lisinopril (PRINIVIL,ZESTRIL) 40 MG tablet Take 40 mg by mouth daily.  . Multiple Vitamin (MULTI-VITAMINS) TABS Take by mouth. One a day vitamin- womens health  . pravastatin (PRAVACHOL) 20 MG tablet Take 20 mg by mouth  daily.  . propranolol (INDERAL) 20 MG tablet Take 20 mg by mouth 2 (two) times daily.  . [DISCONTINUED] LINAGLIPTIN PO Take 1 tablet by mouth daily.   No facility-administered encounter medications on file as of 11/07/2016.     Activities of Daily Living In your present state of health, do you have any difficulty performing the following activities: 11/07/2016  Hearing? Y  Vision? N  Difficulty concentrating or making decisions? N  Walking or climbing stairs? N  Dressing or bathing? N  Doing errands, shopping? N  Preparing Food and eating ? N  Using the Toilet? N  In the past six months, have you accidently leaked urine? N  Do you have problems with loss of bowel control? N  Managing your Medications? N  Managing your Finances? N  Housekeeping or managing your Housekeeping? N  Some recent data might be hidden    Patient Care Team: Birdie Sons, MD as PCP - General (Family Medicine) Gabriel Carina, Betsey Holiday, MD as Physician Assistant (Internal Medicine) Birder Robson, MD as Referring Physician (Ophthalmology)    Assessment:     Exercise Activities and Dietary recommendations Current Exercise Habits: Home exercise routine (chair exercises), Type of exercise: stretching;walking, Time (Minutes): 15, Frequency (Times/Week): 2, Weekly Exercise (Minutes/Week): 30, Intensity: Mild, Exercise limited by: None identified  Goals    . Eat more fruits and vegetables  Recommend to eat more fruits and vegetables, at least 2 servings of each a day.      Fall Risk Fall Risk  11/07/2016 10/11/2015  Falls in the past year? No No   Depression Screen PHQ 2/9 Scores 11/07/2016 11/07/2016 10/11/2015  PHQ - 2 Score 0 0 0  PHQ- 9 Score 2 - 0     Cognitive Function     6CIT Screen 11/07/2016  What Year? 0 points  What month? 0 points  What time? 0 points  Count back from 20 0 points  Months in reverse 0 points  Repeat phrase 0 points  Total Score 0    Immunization History    Administered Date(s) Administered  . Influenza, High Dose Seasonal PF 02/23/2016  . Pneumococcal Conjugate-13 10/11/2015  . Zoster 04/17/2008   Screening Tests Health Maintenance  Topic Date Due  . COLONOSCOPY  10/20/1991  . PNA vac Low Risk Adult (2 of 2 - PPSV23) 10/10/2016  . TETANUS/TDAP  10/20/2026 (Originally 10/19/1960)  . INFLUENZA VACCINE  11/19/2016  . OPHTHALMOLOGY EXAM  12/12/2016  . HEMOGLOBIN A1C  03/04/2017  . FOOT EXAM  09/08/2017  . DEXA SCAN  Completed      Plan:  I have personally reviewed and addressed the Medicare Annual Wellness questionnaire and have noted the following in the patient's chart:  A. Medical and social history B. Use of alcohol, tobacco or illicit drugs  C. Current medications and supplements D. Functional ability and status E.  Nutritional status F.  Physical activity G. Advance directives H. List of other physicians I.  Hospitalizations, surgeries, and ER visits in previous 12 months J.  [place] such as hearing and vision if needed, cognitive and depression L. Referrals and appointments - none  In addition, I have reviewed and discussed with patient certain preventive protocols, quality metrics, and best practice recommendations. A written personalized care plan for preventive services as well as general preventive health recommendations were provided to patient.  See attached scanned questionnaire for additional information.   Signed,  Fabio Neighbors, LPN Nurse Health Advisor   MD Recommendations: Discuss cologuard vs colonoscopy at next visit. Pt never received package for cologuard after last visit. Pt declined tdap and Pneumovax 23 today.

## 2016-11-07 NOTE — Patient Instructions (Addendum)
Margaret Payne , Thank you for taking time to come for your Medicare Wellness Visit. I appreciate your ongoing commitment to your health goals. Please review the following plan we discussed and let me know if I can assist you in the future.   Screening recommendations/referrals: Colonoscopy: declined referral today, will speak to PCP further at next OV Mammogram: completed 02/21/03, due currently Bone Density: completed 11/14/15 Recommended yearly ophthalmology/optometry visit for glaucoma screening and checkup Recommended yearly dental visit for hygiene and checkup  Vaccinations: Influenza vaccine: due 12/2016 Pneumococcal vaccine: Prevnar 13 given 10/11/15, declined Pneumovax 23 today. Pt to checks previous records at pharmacy.  Tdap vaccine: declined Shingles vaccine: completed 04/17/08  Advanced directives: Please bring a copy of your POA (Power of [place]) and/or Living Will to your next appointment.   Conditions/risks identified: Obesity; Recommend to eat more fruits and vegetables, at least 2 servings of each a day.  Next appointment: None, need to schedule 1 year AWV   Preventive Care 78 Years and Older, Female Preventive care refers to lifestyle choices and visits with your health care provider that can promote health and wellness. What does preventive care include?  A yearly physical exam. This is also called an annual well check.  Dental exams once or twice a year.  Routine eye exams. Ask your health care provider how often you should have your eyes checked.  Personal lifestyle choices, including:  Daily care of your teeth and gums.  Regular physical activity.  Eating a healthy diet.  Avoiding tobacco and drug use.  Limiting alcohol use.  Practicing safe sex.  Taking low-dose aspirin every day.  Taking vitamin and mineral supplements as recommended by your health care provider. What happens during an annual well check? The services and screenings done by  your health care provider during your annual well check will depend on your age, overall health, lifestyle risk factors, and family history of disease. Counseling  Your health care provider may ask you questions about your:  Alcohol use.  Tobacco use.  Drug use.  Emotional well-being.  Home and relationship well-being.  Sexual activity.  Eating habits.  History of falls.  Memory and ability to understand (cognition).  Work and work Statistician.  Reproductive health. Screening  You may have the following tests or measurements:  Height, weight, and BMI.  Blood pressure.  Lipid and cholesterol levels. These may be checked every 5 years, or more frequently if you are over 87 years old.  Skin check.  Lung cancer screening. You may have this screening every year starting at age 21 if you have a 30-pack-year history of smoking and currently smoke or have quit within the past 15 years.  Fecal occult blood test (FOBT) of the stool. You may have this test every year starting at age 50.  Flexible sigmoidoscopy or colonoscopy. You may have a sigmoidoscopy every 5 years or a colonoscopy every 10 years starting at age 28.  Hepatitis C blood test.  Hepatitis B blood test.  Sexually transmitted disease (STD) testing.  Diabetes screening. This is done by checking your blood sugar (glucose) after you have not eaten for a while (fasting). You may have this done every 1-3 years.  Bone density scan. This is done to screen for osteoporosis. You may have this done starting at age 41.  Mammogram. This may be done every 1-2 years. Talk to your health care provider about how often you should have regular mammograms. Talk with your health care provider about your  test results, treatment options, and if necessary, the need for more tests. Vaccines  Your health care provider may recommend certain vaccines, such as:  Influenza vaccine. This is recommended every year.  Tetanus,  diphtheria, and acellular pertussis (Tdap, Td) vaccine. You may need a Td booster every 10 years.  Zoster vaccine. You may need this after age 63.  Pneumococcal 13-valent conjugate (PCV13) vaccine. One dose is recommended after age 66.  Pneumococcal polysaccharide (PPSV23) vaccine. One dose is recommended after age 18. Talk to your health care provider about which screenings and vaccines you need and how often you need them. This information is not intended to replace advice given to you by your health care provider. Make sure you discuss any questions you have with your health care provider. Document Released: 05/04/2015 Document Revised: 12/26/2015 Document Reviewed: 02/06/2015 Elsevier Interactive Patient Education  2017 [place] Prevention in the Home Falls can cause injuries. They can happen to people of all ages. There are many things you can do to make your home safe and to help prevent falls. What can I do on the outside of my home?  Regularly fix the edges of walkways and driveways and fix any cracks.  Remove anything that might make you trip as you walk through a door, such as a raised step or threshold.  Trim any bushes or trees on the path to your home.  Use bright outdoor lighting.  Clear any walking paths of anything that might make someone trip, such as rocks or tools.  Regularly check to see if handrails are loose or broken. Make sure that both sides of any steps have handrails.  Any raised decks and porches should have guardrails on the edges.  Have any leaves, snow, or ice cleared regularly.  Use sand or salt on walking paths during winter.  Clean up any spills in your garage right away. This includes oil or grease spills. What can I do in the bathroom?  Use night lights.  Install grab bars by the toilet and in the tub and shower. Do not use towel bars as grab bars.  Use non-skid mats or decals in the tub or shower.  If you need to sit down in  the shower, use a plastic, non-slip stool.  Keep the floor dry. Clean up any water that spills on the floor as soon as it happens.  Remove soap buildup in the tub or shower regularly.  Attach bath mats securely with double-sided non-slip rug tape.  Do not have throw rugs and other things on the floor that can make you trip. What can I do in the bedroom?  Use night lights.  Make sure that you have a light by your bed that is easy to reach.  Do not use any sheets or blankets that are too big for your bed. They should not hang down onto the floor.  Have a firm chair that has side arms. You can use this for support while you get dressed.  Do not have throw rugs and other things on the floor that can make you trip. What can I do in the kitchen?  Clean up any spills right away.  Avoid walking on wet floors.  Keep items that you use a lot in easy-to-reach places.  If you need to reach something above you, use a strong step stool that has a grab bar.  Keep electrical cords out of the way.  Do not use floor polish or wax that  makes floors slippery. If you must use wax, use non-skid floor wax.  Do not have throw rugs and other things on the floor that can make you trip. What can I do with my stairs?  Do not leave any items on the stairs.  Make sure that there are handrails on both sides of the stairs and use them. Fix handrails that are broken or loose. Make sure that handrails are as long as the stairways.  Check any carpeting to make sure that it is firmly attached to the stairs. Fix any carpet that is loose or worn.  Avoid having throw rugs at the top or bottom of the stairs. If you do have throw rugs, attach them to the floor with carpet tape.  Make sure that you have a light switch at the top of the stairs and the bottom of the stairs. If you do not have them, ask someone to add them for you. What else can I do to help prevent falls?  Wear shoes that:  Do not have high  heels.  Have rubber bottoms.  Are comfortable and fit you well.  Are closed at the toe. Do not wear sandals.  If you use a stepladder:  Make sure that it is fully opened. Do not climb a closed stepladder.  Make sure that both sides of the stepladder are locked into place.  Ask someone to hold it for you, if possible.  Clearly mark and make sure that you can see:  Any grab bars or handrails.  First and last steps.  Where the edge of each step is.  Use tools that help you move around (mobility aids) if they are needed. These include:  Canes.  Walkers.  Scooters.  Crutches.  Turn on the lights when you go into a dark area. Replace any light bulbs as soon as they burn out.  Set up your furniture so you have a clear path. Avoid moving your furniture around.  If any of your floors are uneven, fix them.  If there are any pets around you, be aware of where they are.  Review your medicines with your doctor. Some medicines can make you feel dizzy. This can increase your chance of falling. Ask your doctor what other things that you can do to help prevent falls. This information is not intended to replace advice given to you by your health care provider. Make sure you discuss any questions you have with your health care provider. Document Released: 02/01/2009 Document Revised: 09/13/2015 Document Reviewed: 05/12/2014 Elsevier Interactive Patient Education  2017 [employer].

## 2016-11-10 ENCOUNTER — Encounter: Payer: PPO | Admitting: Family Medicine

## 2016-12-01 ENCOUNTER — Ambulatory Visit (INDEPENDENT_AMBULATORY_CARE_PROVIDER_SITE_OTHER): Payer: PPO | Admitting: Family Medicine

## 2016-12-01 ENCOUNTER — Encounter: Payer: Self-pay | Admitting: Family Medicine

## 2016-12-01 VITALS — BP 132/84 | HR 78 | Temp 98.1°F | Resp 16 | Ht 61.0 in | Wt 184.0 lb

## 2016-12-01 DIAGNOSIS — L821 Other seborrheic keratosis: Secondary | ICD-10-CM

## 2016-12-01 DIAGNOSIS — Z1211 Encounter for screening for malignant neoplasm of colon: Secondary | ICD-10-CM | POA: Diagnosis not present

## 2016-12-01 DIAGNOSIS — Z0000 Encounter for general adult medical examination without abnormal findings: Secondary | ICD-10-CM | POA: Diagnosis not present

## 2016-12-01 NOTE — Patient Instructions (Signed)
   The CDC recommends two doses of Shingrix separated by 2 to 6 months for adults age 75 years and older. I recommend checking with your pharmacy plan regarding coverage for this vaccine.

## 2016-12-01 NOTE — Progress Notes (Addendum)
Patient: Margaret Payne, Female    DOB: [birth year], 76 y.o.   MRN: [id number] Visit Date: 12/01/2016  Today's Provider: Lelon Huh, MD   Chief Complaint  Patient presents with  . Annual Exam    discuss Cologaurd kit patient never got this last year when discussed.  . Hypertension    follow up   Subjective:    Annual physical exam Margaret Payne is a 76 y.o. female who presents today for health maintenance and complete physical. She feels fairly well. She reports exercising. She reports she is sleeping fairly well.  -----------------------------------------------------------------  Hypertension, follow-up:  BP Readings from Last 3 Encounters:  12/01/16 132/84  11/07/16 (!) 142/80  10/11/15 (!) 142/80    She was last seen for hypertension 1 years ago.  BP at that visit was 142/80. Management since that visit includes no changes. She reports good compliance with treatment. She is not having side effects.  She is exercising. She is adherent to low salt diet.   She is experiencing none.  Patient denies chest pain, chest pressure/discomfort, claudication, dyspnea, exertional chest pressure/discomfort, fatigue, irregular heart beat, lower extremity edema, near-syncope, orthopnea, palpitations, paroxysmal nocturnal dyspnea, syncope and tachypnea.   Cardiovascular risk factors include advanced age (older than 13 for men, 23 for women), diabetes mellitus and hypertension.  Use of agents associated with hypertension: none.     Weight trend: stable Wt Readings from Last 3 Encounters:  12/01/16 184 lb (83.5 kg)  11/07/16 184 lb (83.5 kg)  10/11/15 178 lb (80.7 kg)    Current diet: well balanced  ------------------------------------------------------------------------ Follow up of Diabetes:  Patient is followed by Dr. Gabriel Carina and feels well on current regiment. She continues on daily dose of vitamin d    Review of Systems  Constitutional: Negative for chills,  fatigue and fever.  HENT: Positive for dental problem. Negative for congestion, ear pain, rhinorrhea, sneezing and sore throat.   Eyes: Negative.  Negative for pain and redness.  Respiratory: Negative for cough, shortness of breath and wheezing.   Cardiovascular: Negative for chest pain and leg swelling.  Gastrointestinal: Negative for abdominal pain, blood in stool, constipation, diarrhea and nausea.  Endocrine: Negative for polydipsia and polyphagia.  Genitourinary: Negative.  Negative for dysuria, flank pain, hematuria, pelvic pain, vaginal bleeding and vaginal discharge.  Musculoskeletal: Negative for arthralgias, back pain, gait problem and joint swelling.  Skin: Negative for rash.  Neurological: Negative.  Negative for dizziness, tremors, seizures, weakness, light-headedness, numbness and headaches.  Hematological: Negative for adenopathy.  Psychiatric/Behavioral: Negative.  Negative for behavioral problems, confusion and dysphoric mood. The patient is not nervous/anxious and is not hyperactive.     Social History      She  reports that she has never smoked. She has never used smokeless tobacco. She reports that she does not drink alcohol or use drugs.       Social History   Social History  . Marital status: Single    Spouse name: N/A  . Number of children: 1  . Years of education: N/A   Occupational History  . Retired    Social History Main Topics  . Smoking status: Never Smoker  . Smokeless tobacco: Never Used  . Alcohol use No  . Drug use: No  . Sexual activity: Not Asked   Other Topics Concern  . None   Social History Narrative  . None    Past Medical History:  Diagnosis Date  . Diabetes mellitus  without complication ([place])   . Headache    migrains  . HOH (hard of hearing)      Patient Active Problem List   Diagnosis Date Noted  . Osteopenia 11/21/2015  . Vitamin D deficiency 10/11/2015  . Allergic rhinitis 10/10/2015  . Diabetes mellitus ([place])  10/10/2015  . Hypertension 10/10/2015  . History of migraine headaches 10/10/2015  . Obesity 10/10/2015    Past Surgical History:  Procedure Laterality Date  . ABDOMINAL HYSTERECTOMY    . BREAST BIOPSY    . CATARACT EXTRACTION W/PHACO Right 10/03/2014   Procedure: CATARACT EXTRACTION PHACO AND INTRAOCULAR LENS PLACEMENT (IOC);  Surgeon: Birder Robson, MD;  Location: [facility] ORS;  Service: Ophthalmology;  Laterality: Right;  [place] 00:57 AP% 16.7 CDE 9.52 Fluid pack NWG#[id number] H    Family History        Family Status  Relation Status  . Mother Deceased at age 25       old age  . Father Deceased  . Sister Alive        Her family history includes Breast cancer in her mother; Breast cancer (age of onset: 6) in her sister; Emphysema in her father.     Allergies  Allergen Reactions  . Codeine Sulfate Nausea Only     Current Outpatient Prescriptions:  .  Cholecalciferol (VITAMIN D3) 5000 units TABS, Take 5,000 Units by mouth daily., Disp: , Rfl:  .  glipiZIDE (GLUCOTROL XL) 10 MG 24 hr tablet, Take 10 mg by mouth daily. , Disp: , Rfl:  .  insulin aspart protamine- aspart (NOVOLOG MIX 70/30) (70-30) 100 UNIT/ML injection, Inject into the skin 2 (two) times daily with a meal. , Disp: , Rfl:  .  lisinopril (PRINIVIL,ZESTRIL) 40 MG tablet, Take 40 mg by mouth daily., Disp: , Rfl:  .  Multiple Vitamin (MULTI-VITAMINS) TABS, Take by mouth. One a day vitamin- womens health, Disp: , Rfl:  .  propranolol (INDERAL) 20 MG tablet, Take 20 mg by mouth 2 (two) times daily., Disp: , Rfl:  .  pravastatin (PRAVACHOL) 20 MG tablet, Take 20 mg by mouth daily., Disp: , Rfl:    Patient Care Team: Birdie Sons, MD as PCP - General (Family Medicine) Solum, Betsey Holiday, MD as Physician Assistant (Internal Medicine) Birder Robson, MD as Referring Physician (Ophthalmology)      Objective:   Vitals: BP 132/84   Pulse 78   Temp 98.1 F (36.7 C)   Resp 16   Ht '5\' 1"'$  (1.549 m)   Wt 184 lb (83.5  kg)   BMI 34.77 kg/m    Vitals:   12/01/16 1039  BP: 132/84  Pulse: 78  Resp: 16  Temp: 98.1 F (36.7 C)  Weight: 184 lb (83.5 kg)  Height: '5\' 1"'$  (1.549 m)     Physical Exam   General Appearance:    Alert, cooperative, no distress, appears stated age  Head:    Normocephalic, without obvious abnormality, atraumatic  Eyes:    PERRL, conjunctiva/corneas clear, EOM's intact, fundi    benign, both eyes  Ears:    Normal TM's and external ear canals, both ears  Nose:   Nares normal, septum midline, mucosa normal, no drainage    or sinus tenderness  Throat:   Lips, mucosa, and tongue normal; teeth and gums normal  Neck:   Supple, symmetrical, trachea midline, no adenopathy;    thyroid:  no enlargement/tenderness/nodules; no carotid   bruit or JVD  Back:     Symmetric, no  curvature, ROM normal, no CVA tenderness  Lungs:     Clear to auscultation bilaterally, respirations unlabored  Chest Wall:    No tenderness or deformity   Heart:    Regular rate and rhythm, S1 and S2 normal, no murmur, rub   or gallop  Breast Exam:    normal appearance, no masses or tenderness  Abdomen:     Soft, non-tender, bowel sounds active all four quadrants,    no masses, no organomegaly  Pelvic:    deferred  Extremities:   Extremities normal, atraumatic, no cyanosis or edema  Pulses:   2+ and symmetric all extremities  Skin:   Multiple seborrheic keratosis scatter over back and chest with varying amounts of pigmentation.   Lymph nodes:   Cervical, supraclavicular, and axillary nodes normal  Neurologic:   CNII-XII intact, normal strength, sensation and reflexes    throughout    Depression Screen PHQ 2/9 Scores 11/07/2016 11/07/2016 10/11/2015  PHQ - 2 Score 0 0 0  PHQ- 9 Score 2 - 0      Assessment & Plan:     Routine Health Maintenance and Physical Exam  Exercise Activities and Dietary recommendations Goals    . Eat more fruits and vegetables          Recommend to eat more fruits and  vegetables, at least 2 servings of each a day.       Immunization History  Administered Date(s) Administered  . Influenza, High Dose Seasonal PF 02/23/2016  . Pneumococcal Conjugate-13 10/11/2015  . Zoster 04/17/2008    Health Maintenance  Topic Date Due  . COLONOSCOPY  10/20/1991  . PNA vac Low Risk Adult (2 of 2 - PPSV23) 10/10/2016  . INFLUENZA VACCINE  01/19/2017 (Originally 11/19/2016)  . TETANUS/TDAP  10/20/2026 (Originally 10/19/1960)  . OPHTHALMOLOGY EXAM  12/12/2016  . HEMOGLOBIN A1C  03/04/2017  . FOOT EXAM  09/08/2017  . DEXA SCAN  Completed     Discussed health benefits of physical activity, and encouraged her to engage in regular exercise appropriate for her age and condition.    --------------------------------------------------------------------  1. Annual physical exam Generally doing well. Continue current medications.  She states she is certain she had pneumovax at age 66 given at [facility]. [facility] was called and they do not have records going back to 2008. Patient declined vaccine today.   2. Colon cancer screening She never received Cologuard ordered last year. Will re-order this year. Advised to call if it doesn't arrive within 2 weeks.  - Cologuard  3. Seborrheic keratosis. Advised her then she needs to establish with another dermatologist since Dr. Tyler Deis has moved away. She states she will call [facility]. Advised to call office if she needs referral for insurance.   Lelon Huh, MD  [facility]

## 2016-12-11 DIAGNOSIS — E1165 Type 2 diabetes mellitus with hyperglycemia: Secondary | ICD-10-CM | POA: Diagnosis not present

## 2016-12-11 DIAGNOSIS — E785 Hyperlipidemia, unspecified: Secondary | ICD-10-CM | POA: Diagnosis not present

## 2016-12-11 DIAGNOSIS — Z794 Long term (current) use of insulin: Secondary | ICD-10-CM | POA: Diagnosis not present

## 2016-12-17 DIAGNOSIS — R809 Proteinuria, unspecified: Secondary | ICD-10-CM | POA: Diagnosis not present

## 2016-12-17 DIAGNOSIS — Z794 Long term (current) use of insulin: Secondary | ICD-10-CM | POA: Diagnosis not present

## 2016-12-17 DIAGNOSIS — N184 Chronic kidney disease, stage 4 (severe): Secondary | ICD-10-CM | POA: Diagnosis not present

## 2016-12-17 DIAGNOSIS — E1129 Type 2 diabetes mellitus with other diabetic kidney complication: Secondary | ICD-10-CM | POA: Diagnosis not present

## 2016-12-17 DIAGNOSIS — E1122 Type 2 diabetes mellitus with diabetic chronic kidney disease: Secondary | ICD-10-CM | POA: Diagnosis not present

## 2016-12-17 DIAGNOSIS — E11319 Type 2 diabetes mellitus with unspecified diabetic retinopathy without macular edema: Secondary | ICD-10-CM | POA: Diagnosis not present

## 2016-12-17 DIAGNOSIS — E785 Hyperlipidemia, unspecified: Secondary | ICD-10-CM | POA: Diagnosis not present

## 2016-12-17 DIAGNOSIS — E1165 Type 2 diabetes mellitus with hyperglycemia: Secondary | ICD-10-CM | POA: Diagnosis not present

## 2017-01-29 ENCOUNTER — Telehealth: Payer: Self-pay | Admitting: Family Medicine

## 2017-01-29 NOTE — Telephone Encounter (Signed)
Order for cologuard faxed to [facility] °

## 2017-02-09 DIAGNOSIS — Z1212 Encounter for screening for malignant neoplasm of rectum: Secondary | ICD-10-CM | POA: Diagnosis not present

## 2017-02-09 DIAGNOSIS — Z1211 Encounter for screening for malignant neoplasm of colon: Secondary | ICD-10-CM | POA: Diagnosis not present

## 2017-02-09 LAB — COLOGUARD: Cologuard: NEGATIVE

## 2017-03-16 DIAGNOSIS — E785 Hyperlipidemia, unspecified: Secondary | ICD-10-CM | POA: Diagnosis not present

## 2017-03-16 DIAGNOSIS — Z794 Long term (current) use of insulin: Secondary | ICD-10-CM | POA: Diagnosis not present

## 2017-03-16 DIAGNOSIS — E1165 Type 2 diabetes mellitus with hyperglycemia: Secondary | ICD-10-CM | POA: Diagnosis not present

## 2017-03-16 DIAGNOSIS — H2512 Age-related nuclear cataract, left eye: Secondary | ICD-10-CM | POA: Diagnosis not present

## 2017-03-16 LAB — HM DIABETES EYE EXAM

## 2017-03-18 ENCOUNTER — Encounter: Payer: Self-pay | Admitting: Family Medicine

## 2017-03-20 DIAGNOSIS — E1122 Type 2 diabetes mellitus with diabetic chronic kidney disease: Secondary | ICD-10-CM | POA: Diagnosis not present

## 2017-03-20 DIAGNOSIS — Z794 Long term (current) use of insulin: Secondary | ICD-10-CM | POA: Diagnosis not present

## 2017-03-20 DIAGNOSIS — E1165 Type 2 diabetes mellitus with hyperglycemia: Secondary | ICD-10-CM | POA: Diagnosis not present

## 2017-03-20 DIAGNOSIS — N184 Chronic kidney disease, stage 4 (severe): Secondary | ICD-10-CM | POA: Diagnosis not present

## 2017-03-20 DIAGNOSIS — R809 Proteinuria, unspecified: Secondary | ICD-10-CM | POA: Diagnosis not present

## 2017-03-20 DIAGNOSIS — E1129 Type 2 diabetes mellitus with other diabetic kidney complication: Secondary | ICD-10-CM | POA: Diagnosis not present

## 2017-03-20 DIAGNOSIS — E782 Mixed hyperlipidemia: Secondary | ICD-10-CM | POA: Diagnosis not present

## 2017-06-17 DIAGNOSIS — E1165 Type 2 diabetes mellitus with hyperglycemia: Secondary | ICD-10-CM | POA: Diagnosis not present

## 2017-06-17 DIAGNOSIS — Z794 Long term (current) use of insulin: Secondary | ICD-10-CM | POA: Diagnosis not present

## 2017-06-17 DIAGNOSIS — E782 Mixed hyperlipidemia: Secondary | ICD-10-CM | POA: Diagnosis not present

## 2017-06-17 DIAGNOSIS — E1129 Type 2 diabetes mellitus with other diabetic kidney complication: Secondary | ICD-10-CM | POA: Diagnosis not present

## 2017-06-17 DIAGNOSIS — R809 Proteinuria, unspecified: Secondary | ICD-10-CM | POA: Diagnosis not present

## 2017-06-22 DIAGNOSIS — N184 Chronic kidney disease, stage 4 (severe): Secondary | ICD-10-CM | POA: Diagnosis not present

## 2017-06-22 DIAGNOSIS — E1122 Type 2 diabetes mellitus with diabetic chronic kidney disease: Secondary | ICD-10-CM | POA: Diagnosis not present

## 2017-06-22 DIAGNOSIS — E1165 Type 2 diabetes mellitus with hyperglycemia: Secondary | ICD-10-CM | POA: Diagnosis not present

## 2017-06-22 DIAGNOSIS — E782 Mixed hyperlipidemia: Secondary | ICD-10-CM | POA: Diagnosis not present

## 2017-06-22 DIAGNOSIS — E1129 Type 2 diabetes mellitus with other diabetic kidney complication: Secondary | ICD-10-CM | POA: Diagnosis not present

## 2017-06-22 DIAGNOSIS — Z794 Long term (current) use of insulin: Secondary | ICD-10-CM | POA: Diagnosis not present

## 2017-06-22 DIAGNOSIS — R809 Proteinuria, unspecified: Secondary | ICD-10-CM | POA: Diagnosis not present

## 2017-09-22 DIAGNOSIS — E782 Mixed hyperlipidemia: Secondary | ICD-10-CM | POA: Diagnosis not present

## 2017-09-22 DIAGNOSIS — E1165 Type 2 diabetes mellitus with hyperglycemia: Secondary | ICD-10-CM | POA: Diagnosis not present

## 2017-09-22 DIAGNOSIS — R809 Proteinuria, unspecified: Secondary | ICD-10-CM | POA: Diagnosis not present

## 2017-09-22 DIAGNOSIS — E1129 Type 2 diabetes mellitus with other diabetic kidney complication: Secondary | ICD-10-CM | POA: Diagnosis not present

## 2017-09-22 DIAGNOSIS — Z794 Long term (current) use of insulin: Secondary | ICD-10-CM | POA: Diagnosis not present

## 2017-09-22 LAB — HEMOGLOBIN A1C: Hemoglobin A1C: 8.1

## 2017-09-22 LAB — LIPID PANEL
CHOLESTEROL: 142 (ref 0–200)
HDL: 40 (ref 35–70)
LDL Cholesterol: 75
Triglycerides: 134 (ref 40–160)

## 2017-09-23 DIAGNOSIS — E1129 Type 2 diabetes mellitus with other diabetic kidney complication: Secondary | ICD-10-CM | POA: Diagnosis not present

## 2017-09-23 DIAGNOSIS — E782 Mixed hyperlipidemia: Secondary | ICD-10-CM | POA: Diagnosis not present

## 2017-09-23 DIAGNOSIS — E1165 Type 2 diabetes mellitus with hyperglycemia: Secondary | ICD-10-CM | POA: Diagnosis not present

## 2017-09-23 DIAGNOSIS — Z794 Long term (current) use of insulin: Secondary | ICD-10-CM | POA: Diagnosis not present

## 2017-09-23 DIAGNOSIS — R809 Proteinuria, unspecified: Secondary | ICD-10-CM | POA: Diagnosis not present

## 2017-09-23 LAB — MICROALBUMIN, URINE: Microalb, Ur: <7

## 2017-09-29 DIAGNOSIS — E11319 Type 2 diabetes mellitus with unspecified diabetic retinopathy without macular edema: Secondary | ICD-10-CM | POA: Diagnosis not present

## 2017-09-29 DIAGNOSIS — R809 Proteinuria, unspecified: Secondary | ICD-10-CM | POA: Diagnosis not present

## 2017-09-29 DIAGNOSIS — Z794 Long term (current) use of insulin: Secondary | ICD-10-CM | POA: Diagnosis not present

## 2017-09-29 DIAGNOSIS — E1165 Type 2 diabetes mellitus with hyperglycemia: Secondary | ICD-10-CM | POA: Diagnosis not present

## 2017-09-29 DIAGNOSIS — E1122 Type 2 diabetes mellitus with diabetic chronic kidney disease: Secondary | ICD-10-CM | POA: Diagnosis not present

## 2017-09-29 DIAGNOSIS — E782 Mixed hyperlipidemia: Secondary | ICD-10-CM | POA: Diagnosis not present

## 2017-09-29 DIAGNOSIS — N184 Chronic kidney disease, stage 4 (severe): Secondary | ICD-10-CM | POA: Diagnosis not present

## 2017-09-29 DIAGNOSIS — E1129 Type 2 diabetes mellitus with other diabetic kidney complication: Secondary | ICD-10-CM | POA: Diagnosis not present

## 2017-11-10 ENCOUNTER — Ambulatory Visit (INDEPENDENT_AMBULATORY_CARE_PROVIDER_SITE_OTHER): Payer: PPO

## 2017-11-10 VITALS — BP 126/52 | HR 65 | Temp 99.3°F | Ht 61.0 in | Wt 189.0 lb

## 2017-11-10 DIAGNOSIS — Z23 Encounter for immunization: Secondary | ICD-10-CM

## 2017-11-10 DIAGNOSIS — Z0000 Encounter for general adult medical examination without abnormal findings: Secondary | ICD-10-CM | POA: Diagnosis not present

## 2017-11-10 NOTE — Progress Notes (Signed)
Subjective:   Margaret Payne is a 77 y.o. female who presents for Medicare Annual (Subsequent) preventive examination.  Review of Systems:  N/A  Cardiac Risk Factors include: advanced age (>65men, >53 women);diabetes mellitus;dyslipidemia;hypertension;obesity (BMI >30kg/m2)     Objective:     Vitals: BP (!) 126/52 (BP Location: Right Arm)   Pulse 65   Temp 99.3 F (37.4 C) (Oral)   Ht 5\' 1"  (1.549 m)   Wt 189 lb (85.7 kg)   BMI 35.71 kg/m   Body mass index is 35.71 kg/m.  Advanced Directives 11/10/2017 11/07/2016  Does Patient Have a Medical Advance Directive? Yes Yes  Type of Paramedic of [place];Living will [place];Living will  Copy of [place] in Chart? No - copy requested No - copy requested    Tobacco Social History   Tobacco Use  Smoking Status Never Smoker  Smokeless Tobacco Never Used     Counseling given: Not Answered   Clinical Intake:  Pre-visit preparation completed: Yes  Pain : No/denies pain Pain Score: 0-No pain     Nutritional Status: BMI > 30  Obese Nutritional Risks: None Diabetes: Yes(type 2) CBG done?: No Did pt. bring in CBG monitor from home?: No  How often do you need to have someone help you when you read instructions, pamphlets, or other written materials from your doctor or pharmacy?: 1 - Never  Interpreter Needed?: No  Information entered by :: [facility], LPN  Past Medical History:  Diagnosis Date  . Diabetes mellitus without complication ([place])    type 2  . Headache    migrains  . HOH (hard of hearing)   . Hyperlipidemia   . Hypertension    Past Surgical History:  Procedure Laterality Date  . ABDOMINAL HYSTERECTOMY    . BREAST BIOPSY    . CATARACT EXTRACTION W/PHACO Right 10/03/2014   Procedure: CATARACT EXTRACTION PHACO AND INTRAOCULAR LENS PLACEMENT (IOC);  Surgeon: Birder Robson, MD;  Location: [facility] ORS;  Service: Ophthalmology;  Laterality:  Right;  [place] 00:57 AP% 16.7 CDE 9.52 Fluid pack UXL#[id number] H   Family History  Problem Relation Age of Onset  . Breast cancer Mother   . Emphysema Father   . Breast cancer Sister 74   Social History   Socioeconomic History  . Marital status: Single    Spouse name: Not on file  . Number of children: 1  . Years of education: Not on file  . Highest education level: Associate degree: occupational, Hotel manager, or vocational program  Occupational History  . Occupation: Retired  Scientific laboratory technician  . Financial resource strain: Not hard at all  . Food insecurity:    Worry: Never true    Inability: Never true  . Transportation needs:    Medical: No    Non-medical: No  Tobacco Use  . Smoking status: Never Smoker  . Smokeless tobacco: Never Used  Substance and Sexual Activity  . Alcohol use: No    Alcohol/week: 0.0 oz  . Drug use: No  . Sexual activity: Not on file  Lifestyle  . Physical activity:    Days per week: Not on file    Minutes per session: Not on file  . Stress: Not at all  Relationships  . Social connections:    Talks on phone: Not on file    Gets together: Not on file    Attends religious service: Not on file    Active member of club or organization: Not  on file    Attends meetings of clubs or organizations: Not on file    Relationship status: Not on file  Other Topics Concern  . Not on file  Social History Narrative  . Not on file    Outpatient Encounter Medications as of 11/10/2017  Medication Sig  . Cholecalciferol (VITAMIN D3) 5000 units TABS Take 5,000 Units by mouth daily.  Marland Kitchen glipiZIDE (GLUCOTROL XL) 10 MG 24 hr tablet Take 10 mg by mouth 2 (two) times daily.   . insulin aspart protamine- aspart (NOVOLOG MIX 70/30) (70-30) 100 UNIT/ML injection Inject into the skin 2 (two) times daily with a meal.   . lisinopril (PRINIVIL,ZESTRIL) 40 MG tablet Take 40 mg by mouth daily.  . Multiple Vitamin (MULTI-VITAMINS) TABS Take by mouth. One a day vitamin- womens  health  . pravastatin (PRAVACHOL) 80 MG tablet Take 80 mg by mouth daily.   . propranolol (INDERAL) 20 MG tablet Take 20 mg by mouth 2 (two) times daily.  . pravastatin (PRAVACHOL) 20 MG tablet Take 20 mg by mouth daily.   No facility-administered encounter medications on file as of 11/10/2017.     Activities of Daily Living In your present state of health, do you have any difficulty performing the following activities: 11/10/2017  Hearing? Y  Comment Wears a hearing aid in left ear.  Vision? N  Difficulty concentrating or making decisions? N  Walking or climbing stairs? Y  Comment Due to hip or knee pain.  Dressing or bathing? N  Doing errands, shopping? N  Preparing Food and eating ? N  Using the Toilet? N  In the past six months, have you accidently leaked urine? N  Do you have problems with loss of bowel control? N  Managing your Medications? N  Managing your Finances? N  Housekeeping or managing your Housekeeping? N  Some recent data might be hidden    Patient Care Team: Birdie Sons, MD as PCP - General (Family Medicine) Gabriel Carina, Betsey Holiday, MD as Physician Assistant (Internal Medicine) Birder Robson, MD as Referring Physician (Ophthalmology)    Assessment:   This is a routine wellness examination for Margaret Payne.  Exercise Activities and Dietary recommendations Current Exercise Habits: The patient does not participate in regular exercise at present, Exercise limited by: orthopedic condition(s);Other - see comments(heat)  Goals    . DIET - EAT MORE FRUITS AND VEGETABLES     Recommend eating 2-3 servings of vegetables a day.        Fall Risk Fall Risk  11/10/2017 11/07/2016 10/11/2015  Falls in the past year? No No No   Is the patient's home free of loose throw rugs in walkways, pet beds, electrical cords, etc?   yes      Grab bars in the bathroom? yes      Handrails on the stairs?   no      Adequate lighting?   yes  Timed Get Up and Go performed: N/A  Depression  Screen PHQ 2/9 Scores 11/10/2017 11/10/2017 11/07/2016 11/07/2016  PHQ - 2 Score 0 0 0 0  PHQ- 9 Score 2 - 2 -     Cognitive Function: Pt declined screening today.      6CIT Screen 11/07/2016  What Year? 0 points  What month? 0 points  What time? 0 points  Count back from 20 0 points  Months in reverse 0 points  Repeat phrase 0 points  Total Score 0    Immunization History  Administered Date(s) Administered  .  Influenza, High Dose Seasonal PF 02/23/2016  . Influenza,inj,Quad PF,6+ Mos 01/23/2017  . Pneumococcal Conjugate-13 10/11/2015  . Pneumococcal Polysaccharide-23 11/10/2017  . Zoster 04/17/2008    Qualifies for Shingles Vaccine? Due for Shingles vaccine. Declined my offer to administer today. Education has been provided regarding the importance of this vaccine. Pt has been advised to call her insurance company to determine her out of pocket expense. Advised she may also receive this vaccine at her local pharmacy or Health Dept. Verbalized acceptance and understanding.  Screening Tests Health Maintenance  Topic Date Due  . TETANUS/TDAP  10/20/2026 (Originally 10/19/1959)  . INFLUENZA VACCINE  11/19/2017  . OPHTHALMOLOGY EXAM  03/16/2018  . HEMOGLOBIN A1C  03/24/2018  . FOOT EXAM  09/30/2018  . DEXA SCAN  Completed  . PNA vac Low Risk Adult  Completed    Cancer Screenings: Lung: Low Dose CT Chest recommended if Age 62-80 years, 30 pack-year currently smoking OR have quit w/in 15years. Patient does not qualify. Breast:  Up to date on Mammogram? Yes   Up to date of Bone Density/Dexa? Yes Colorectal: N/A  Additional Screenings:  Hepatitis C Screening: N/A     Plan:  I have personally reviewed and addressed the Medicare Annual Wellness questionnaire and have noted the following in the patient's chart:  A. Medical and social history B. Use of alcohol, tobacco or illicit drugs  C. Current medications and supplements D. Functional ability and status E.  Nutritional  status F.  Physical activity G. Advance directives H. List of other physicians I.  Hospitalizations, surgeries, and ER visits in previous 12 months J.  [place] such as hearing and vision if needed, cognitive and depression L. Referrals and appointments - none  In addition, I have reviewed and discussed with patient certain preventive protocols, quality metrics, and best practice recommendations. A written personalized care plan for preventive services as well as general preventive health recommendations were provided to patient.  See attached scanned questionnaire for additional information.   Signed,  Fabio Neighbors, LPN Nurse Health Advisor   Nurse Recommendations: None, pt declined the tetanus vaccine today.

## 2017-11-10 NOTE — Patient Instructions (Signed)
Margaret Payne , Thank you for taking time to come for your Medicare Wellness Visit. I appreciate your ongoing commitment to your health goals. Please review the following plan we discussed and let me know if I can assist you in the future.   Screening recommendations/referrals: Colonoscopy: N/A Mammogram: Up to date Bone Density: Up to date Recommended yearly ophthalmology/optometry visit for glaucoma screening and checkup Recommended yearly dental visit for hygiene and checkup  Vaccinations: Influenza vaccine: Up to date Pneumococcal vaccine: Up to date Tdap vaccine: Pt declines today.  Shingles vaccine: Pt declines today.     Advanced directives: Please bring a copy of your POA (Power of Attorney) and/or Living Will to your next appointment.   Conditions/risks identified: Recommend eating 2-3 servings of vegetables a day.   Next appointment: 11/18/17 @ 10 with Dr Caryn Section.    Preventive Care 76 Years and Older, Female Preventive care refers to lifestyle choices and visits with your health care provider that can promote health and wellness. What does preventive care include?  A yearly physical exam. This is also called an annual well check.  Dental exams once or twice a year.  Routine eye exams. Ask your health care provider how often you should have your eyes checked.  Personal lifestyle choices, including:  Daily care of your teeth and gums.  Regular physical activity.  Eating a healthy diet.  Avoiding tobacco and drug use.  Limiting alcohol use.  Practicing safe sex.  Taking low-dose aspirin every day.  Taking vitamin and mineral supplements as recommended by your health care provider. What happens during an annual well check? The services and screenings done by your health care provider during your annual well check will depend on your age, overall health, lifestyle risk factors, and family history of disease. Counseling  Your health care provider may ask you  questions about your:  Alcohol use.  Tobacco use.  Drug use.  Emotional well-being.  Home and relationship well-being.  Sexual activity.  Eating habits.  History of falls.  Memory and ability to understand (cognition).  Work and work Statistician.  Reproductive health. Screening  You may have the following tests or measurements:  Height, weight, and BMI.  Blood pressure.  Lipid and cholesterol levels. These may be checked every 5 years, or more frequently if you are over 85 years old.  Skin check.  Lung cancer screening. You may have this screening every year starting at age 40 if you have a 30-pack-year history of smoking and currently smoke or have quit within the past 15 years.  Fecal occult blood test (FOBT) of the stool. You may have this test every year starting at age 30.  Flexible sigmoidoscopy or colonoscopy. You may have a sigmoidoscopy every 5 years or a colonoscopy every 10 years starting at age 80.  Hepatitis C blood test.  Hepatitis B blood test.  Sexually transmitted disease (STD) testing.  Diabetes screening. This is done by checking your blood sugar (glucose) after you have not eaten for a while (fasting). You may have this done every 1-3 years.  Bone density scan. This is done to screen for osteoporosis. You may have this done starting at age 37.  Mammogram. This may be done every 1-2 years. Talk to your health care provider about how often you should have regular mammograms. Talk with your health care provider about your test results, treatment options, and if necessary, the need for more tests. Vaccines  Your health care provider may recommend certain vaccines,  such as:  Influenza vaccine. This is recommended every year.  Tetanus, diphtheria, and acellular pertussis (Tdap, Td) vaccine. You may need a Td booster every 10 years.  Zoster vaccine. You may need this after age 76.  Pneumococcal 13-valent conjugate (PCV13) vaccine. One dose is  recommended after age 28.  Pneumococcal polysaccharide (PPSV23) vaccine. One dose is recommended after age 76. Talk to your health care provider about which screenings and vaccines you need and how often you need them. This information is not intended to replace advice given to you by your health care provider. Make sure you discuss any questions you have with your health care provider. Document Released: 05/04/2015 Document Revised: 12/26/2015 Document Reviewed: 02/06/2015 Elsevier Interactive Patient Education  2017 [place] Prevention in the Home Falls can cause injuries. They can happen to people of all ages. There are many things you can do to make your home safe and to help prevent falls. What can I do on the outside of my home?  Regularly fix the edges of walkways and driveways and fix any cracks.  Remove anything that might make you trip as you walk through a door, such as a raised step or threshold.  Trim any bushes or trees on the path to your home.  Use bright outdoor lighting.  Clear any walking paths of anything that might make someone trip, such as rocks or tools.  Regularly check to see if handrails are loose or broken. Make sure that both sides of any steps have handrails.  Any raised decks and porches should have guardrails on the edges.  Have any leaves, snow, or ice cleared regularly.  Use sand or salt on walking paths during winter.  Clean up any spills in your garage right away. This includes oil or grease spills. What can I do in the bathroom?  Use night lights.  Install grab bars by the toilet and in the tub and shower. Do not use towel bars as grab bars.  Use non-skid mats or decals in the tub or shower.  If you need to sit down in the shower, use a plastic, non-slip stool.  Keep the floor dry. Clean up any water that spills on the floor as soon as it happens.  Remove soap buildup in the tub or shower regularly.  Attach bath mats  securely with double-sided non-slip rug tape.  Do not have throw rugs and other things on the floor that can make you trip. What can I do in the bedroom?  Use night lights.  Make sure that you have a light by your bed that is easy to reach.  Do not use any sheets or blankets that are too big for your bed. They should not hang down onto the floor.  Have a firm chair that has side arms. You can use this for support while you get dressed.  Do not have throw rugs and other things on the floor that can make you trip. What can I do in the kitchen?  Clean up any spills right away.  Avoid walking on wet floors.  Keep items that you use a lot in easy-to-reach places.  If you need to reach something above you, use a strong step stool that has a grab bar.  Keep electrical cords out of the way.  Do not use floor polish or wax that makes floors slippery. If you must use wax, use non-skid floor wax.  Do not have throw rugs and other things on  the floor that can make you trip. What can I do with my stairs?  Do not leave any items on the stairs.  Make sure that there are handrails on both sides of the stairs and use them. Fix handrails that are broken or loose. Make sure that handrails are as long as the stairways.  Check any carpeting to make sure that it is firmly attached to the stairs. Fix any carpet that is loose or worn.  Avoid having throw rugs at the top or bottom of the stairs. If you do have throw rugs, attach them to the floor with carpet tape.  Make sure that you have a light switch at the top of the stairs and the bottom of the stairs. If you do not have them, ask someone to add them for you. What else can I do to help prevent falls?  Wear shoes that:  Do not have high heels.  Have rubber bottoms.  Are comfortable and fit you well.  Are closed at the toe. Do not wear sandals.  If you use a stepladder:  Make sure that it is fully opened. Do not climb a closed  stepladder.  Make sure that both sides of the stepladder are locked into place.  Ask someone to hold it for you, if possible.  Clearly mark and make sure that you can see:  Any grab bars or handrails.  First and last steps.  Where the edge of each step is.  Use tools that help you move around (mobility aids) if they are needed. These include:  Canes.  Walkers.  Scooters.  Crutches.  Turn on the lights when you go into a dark area. Replace any light bulbs as soon as they burn out.  Set up your furniture so you have a clear path. Avoid moving your furniture around.  If any of your floors are uneven, fix them.  If there are any pets around you, be aware of where they are.  Review your medicines with your doctor. Some medicines can make you feel dizzy. This can increase your chance of falling. Ask your doctor what other things that you can do to help prevent falls. This information is not intended to replace advice given to you by your health care provider. Make sure you discuss any questions you have with your health care provider. Document Released: 02/01/2009 Document Revised: 09/13/2015 Document Reviewed: 05/12/2014 Elsevier Interactive Patient Education  2017 [employer].

## 2017-11-17 NOTE — Progress Notes (Signed)
Patient: Margaret Payne, Female    DOB: Aug 20, 1940, 77 y.o.   MRN: [id number] Visit Date: 11/18/2017  Today's Provider: Lelon Huh, MD   Chief Complaint  Patient presents with  . Annual Exam  . Hypertension   Subjective:   Patient saw McKenzie for AWV on 11/10/2017.   Complete Physical AMA MCMASTER is a 77 y.o. female. She feels fairly well. She reports exercising yes. She reports she is sleeping fairly well.  -----------------------------------------------------------   Hypertension, follow-up:  BP Readings from Last 3 Encounters:  11/18/17 130/69  11/10/17 (!) 126/52  12/01/16 132/84    She was last seen for hypertension 1 years ago.  BP at that visit was 132/84. Management since that visit includes; no changes.She reports good compliance with treatment. She is not having side effects. none She is exercising. She is adherent to low salt diet.   Outside blood pressures are not checking. She is experiencing none.  Patient denies none.   Cardiovascular risk factors include diabetes mellitus.  Use of agents associated with hypertension: none.   ---------------------------------------------------------------   Follow up of Diabetes: Patient is followed by Dr. Gabriel Carina. Last a1c was 8.1 in June Last lipids by Dr. Gabriel Carina in June with TC=14, HDL=40.1 and LDL (calc)=75 Sees Dr. George Ina  every fall, is following cataract.    Review of Systems  HENT: Positive for hearing loss.   Musculoskeletal: Positive for joint swelling.  All other systems reviewed and are negative.   Social History   Socioeconomic History  . Marital status: Single    Spouse name: Not on file  . Number of children: 1  . Years of education: Not on file  . Highest education level: Associate degree: occupational, Hotel manager, or vocational program  Occupational History  . Occupation: Retired  Scientific laboratory technician  . Financial resource strain: Not hard at all  . Food insecurity:    Worry:  Never true    Inability: Never true  . Transportation needs:    Medical: No    Non-medical: No  Tobacco Use  . Smoking status: Never Smoker  . Smokeless tobacco: Never Used  Substance and Sexual Activity  . Alcohol use: No    Alcohol/week: 0.0 oz  . Drug use: No  . Sexual activity: Not on file  Lifestyle  . Physical activity:    Days per week: Not on file    Minutes per session: Not on file  . Stress: Not at all  Relationships  . Social connections:    Talks on phone: Not on file    Gets together: Not on file    Attends religious service: Not on file    Active member of club or organization: Not on file    Attends meetings of clubs or organizations: Not on file    Relationship status: Not on file  . Intimate partner violence:    Fear of current or ex partner: Not on file    Emotionally abused: Not on file    Physically abused: Not on file    Forced sexual activity: Not on file  Other Topics Concern  . Not on file  Social History Narrative  . Not on file    Past Medical History:  Diagnosis Date  . Diabetes mellitus without complication ([place])    type 2  . Headache    migrains  . HOH (hard of hearing)   . Hyperlipidemia   . Hypertension      Patient Active  Problem List   Diagnosis Date Noted  . Osteopenia 11/21/2015  . Vitamin D deficiency 10/11/2015  . Allergic rhinitis 10/10/2015  . Diabetes mellitus ([place]) 10/10/2015  . Hypertension 10/10/2015  . History of migraine headaches 10/10/2015  . Obesity 10/10/2015    Past Surgical History:  Procedure Laterality Date  . ABDOMINAL HYSTERECTOMY    . BREAST BIOPSY    . CATARACT EXTRACTION W/PHACO Right 10/03/2014   Procedure: CATARACT EXTRACTION PHACO AND INTRAOCULAR LENS PLACEMENT (IOC);  Surgeon: Birder Robson, MD;  Location: [facility] ORS;  Service: Ophthalmology;  Laterality: Right;  [place] 00:57 AP% 16.7 CDE 9.52 Fluid pack HDQ#[id number] H    Her family history includes Breast cancer in her mother; Breast cancer  (age of onset: 8) in her sister; Emphysema in her father.      Current Outpatient Medications:  .  Cholecalciferol (VITAMIN D3) 5000 units TABS, Take 5,000 Units by mouth daily., Disp: , Rfl:  .  glipiZIDE (GLUCOTROL XL) 10 MG 24 hr tablet, Take 10 mg by mouth 2 (two) times daily. , Disp: , Rfl:  .  insulin aspart protamine- aspart (NOVOLOG MIX 70/30) (70-30) 100 UNIT/ML injection, Inject into the skin 2 (two) times daily with a meal. , Disp: , Rfl:  .  lisinopril (PRINIVIL,ZESTRIL) 40 MG tablet, Take 40 mg by mouth daily., Disp: , Rfl:  .  Multiple Vitamin (MULTI-VITAMINS) TABS, Take by mouth. One a day vitamin- womens health, Disp: , Rfl:  .  pravastatin (PRAVACHOL) 80 MG tablet, Take 80 mg by mouth daily. , Disp: , Rfl:  .  propranolol (INDERAL) 20 MG tablet, Take 20 mg by mouth 2 (two) times daily., Disp: , Rfl:   Patient Care Team: Birdie Sons, MD as PCP - General (Family Medicine) Gabriel Carina Betsey Holiday, MD as Physician Assistant (Internal Medicine) Birder Robson, MD as Referring Physician (Ophthalmology)     Objective:   Vitals: BP 130/69 (BP Location: Right Arm, Patient Position: Sitting, Cuff Size: Large)   Pulse (!) 50   Temp (!) 97.5 F (36.4 C) (Oral)   Resp 16   Ht 5\' 1"  (1.549 m)   Wt 184 lb (83.5 kg)   SpO2 97%   BMI 34.77 kg/m   Physical Exam    General Appearance:    Alert, cooperative, no distress, appears stated age, obese  Head:    Normocephalic, without obvious abnormality, atraumatic  Eyes:    PERRL, conjunctiva/corneas clear, EOM's intact, fundi    benign, both eyes  Ears:    Normal TM's and external ear canals, both ears  Nose:   Nares normal, septum midline, mucosa normal, no drainage    or sinus tenderness  Throat:   Lips, mucosa, and tongue normal; teeth and gums normal  Neck:   Supple, symmetrical, trachea midline, no adenopathy;    thyroid:  no enlargement/tenderness/nodules; no carotid   bruit or JVD  Back:     Symmetric, no curvature, ROM  normal, no CVA tenderness  Lungs:     Clear to auscultation bilaterally, respirations unlabored  Chest Wall:    No tenderness or deformity   Heart:    Regular rate and rhythm, S1 and S2 normal, no murmur, rub   or gallop  Breast Exam:    normal appearance, no masses or tenderness  Abdomen:     Soft, non-tender, bowel sounds active all four quadrants,    no masses, no organomegaly  Pelvic:    deferred  Extremities:   Extremities normal, atraumatic, no cyanosis  or edema  Pulses:   2+ and symmetric all extremities  Skin:   Skin color, texture, turgor normal, no rashes or lesions  Lymph nodes:   Cervical, supraclavicular, and axillary nodes normal  Neurologic:   CNII-XII intact, normal strength, sensation and reflexes    throughout    Activities of Daily Living In your present state of health, do you have any difficulty performing the following activities: 11/10/2017  Hearing? Y  Comment Wears a hearing aid in left ear.  Vision? N  Difficulty concentrating or making decisions? N  Walking or climbing stairs? Y  Comment Due to hip or knee pain.  Dressing or bathing? N  Doing errands, shopping? N  Preparing Food and eating ? N  Using the Toilet? N  In the past six months, have you accidently leaked urine? N  Do you have problems with loss of bowel control? N  Managing your Medications? N  Managing your Finances? N  Housekeeping or managing your Housekeeping? N  Some recent data might be hidden    Fall Risk Assessment Fall Risk  11/10/2017 11/07/2016 10/11/2015  Falls in the past year? No No No     Depression Screen PHQ 2/9 Scores 11/10/2017 11/10/2017 11/07/2016 11/07/2016  PHQ - 2 Score 0 0 0 0  PHQ- 9 Score 2 - 2 -      Assessment & Plan:    Annual Physical Reviewed patient's Family Medical History Reviewed and updated list of patient's medical providers Assessment of cognitive impairment was done Assessed patient's functional ability Established a written schedule for  health screening [place] Completed and Reviewed  Exercise Activities and Dietary recommendations Goals    . DIET - EAT MORE FRUITS AND VEGETABLES     Recommend eating 2-3 servings of vegetables a day.        Immunization History  Administered Date(s) Administered  . Influenza, High Dose Seasonal PF 02/23/2016  . Influenza,inj,Quad PF,6+ Mos 01/23/2017  . Pneumococcal Conjugate-13 10/11/2015  . Pneumococcal Polysaccharide-23 11/10/2017  . Zoster 04/17/2008    Health Maintenance  Topic Date Due  . TETANUS/TDAP  10/20/2026 (Originally 10/19/1960)  . INFLUENZA VACCINE  11/19/2017  . OPHTHALMOLOGY EXAM  03/16/2018  . HEMOGLOBIN A1C  03/24/2018  . FOOT EXAM  09/30/2018  . DEXA SCAN  Completed  . PNA vac Low Risk Adult  Completed     Discussed health benefits of physical activity, and encouraged her to engage in regular exercise appropriate for her age and condition.    ------------------------------------------------------------------------   1. Annual physical exam Generally doing well.   2. Class 1 obesity with serious comorbidity and body mass index (BMI) of 34.0 to 34.9 in adult, unspecified obesity type Counseled regarding prudent diet and regular exercise.    3. Essential hypertension Well controlled.  Continue current medications.   - EKG 12-Lead  4. Diabetes managed by Dr. Duwayne Heck, MD  [place]

## 2017-11-18 ENCOUNTER — Ambulatory Visit (INDEPENDENT_AMBULATORY_CARE_PROVIDER_SITE_OTHER): Payer: PPO | Admitting: Family Medicine

## 2017-11-18 ENCOUNTER — Encounter: Payer: Self-pay | Admitting: Family Medicine

## 2017-11-18 ENCOUNTER — Encounter: Payer: Self-pay | Admitting: *Deleted

## 2017-11-18 VITALS — BP 130/69 | HR 50 | Temp 97.5°F | Resp 16 | Ht 61.0 in | Wt 184.0 lb

## 2017-11-18 DIAGNOSIS — Z794 Long term (current) use of insulin: Secondary | ICD-10-CM

## 2017-11-18 DIAGNOSIS — E119 Type 2 diabetes mellitus without complications: Secondary | ICD-10-CM | POA: Diagnosis not present

## 2017-11-18 DIAGNOSIS — Z0000 Encounter for general adult medical examination without abnormal findings: Secondary | ICD-10-CM

## 2017-11-18 DIAGNOSIS — D229 Melanocytic nevi, unspecified: Secondary | ICD-10-CM

## 2017-11-18 DIAGNOSIS — Z6834 Body mass index (BMI) 34.0-34.9, adult: Secondary | ICD-10-CM | POA: Diagnosis not present

## 2017-11-18 DIAGNOSIS — I10 Essential (primary) hypertension: Secondary | ICD-10-CM | POA: Diagnosis not present

## 2017-11-18 DIAGNOSIS — E669 Obesity, unspecified: Secondary | ICD-10-CM

## 2017-11-18 NOTE — Patient Instructions (Addendum)
   The CDC recommends two doses of Shingrix (the shingles vaccine) separated by 2 to 6 months for adults age 76 years and older. I recommend checking with your pharmacy plan regarding coverage for this vaccine.    Please call the [facility] [phone]) to schedule a routine screening mammogram.

## 2017-12-22 DIAGNOSIS — L821 Other seborrheic keratosis: Secondary | ICD-10-CM | POA: Diagnosis not present

## 2017-12-22 DIAGNOSIS — L570 Actinic keratosis: Secondary | ICD-10-CM | POA: Diagnosis not present

## 2017-12-22 DIAGNOSIS — D1800 Hemangioma unspecified site: Secondary | ICD-10-CM | POA: Diagnosis not present

## 2017-12-22 DIAGNOSIS — Z1283 Encounter for screening for malignant neoplasm of skin: Secondary | ICD-10-CM | POA: Diagnosis not present

## 2017-12-22 DIAGNOSIS — L304 Erythema intertrigo: Secondary | ICD-10-CM | POA: Diagnosis not present

## 2017-12-22 DIAGNOSIS — L853 Xerosis cutis: Secondary | ICD-10-CM | POA: Diagnosis not present

## 2017-12-22 DIAGNOSIS — D485 Neoplasm of uncertain behavior of skin: Secondary | ICD-10-CM | POA: Diagnosis not present

## 2017-12-22 DIAGNOSIS — D2262 Melanocytic nevi of left upper limb, including shoulder: Secondary | ICD-10-CM | POA: Diagnosis not present

## 2017-12-30 DIAGNOSIS — E1122 Type 2 diabetes mellitus with diabetic chronic kidney disease: Secondary | ICD-10-CM | POA: Diagnosis not present

## 2017-12-30 DIAGNOSIS — Z794 Long term (current) use of insulin: Secondary | ICD-10-CM | POA: Diagnosis not present

## 2017-12-30 DIAGNOSIS — N184 Chronic kidney disease, stage 4 (severe): Secondary | ICD-10-CM | POA: Diagnosis not present

## 2018-01-05 DIAGNOSIS — E1129 Type 2 diabetes mellitus with other diabetic kidney complication: Secondary | ICD-10-CM | POA: Diagnosis not present

## 2018-01-05 DIAGNOSIS — Z794 Long term (current) use of insulin: Secondary | ICD-10-CM | POA: Diagnosis not present

## 2018-01-05 DIAGNOSIS — E1122 Type 2 diabetes mellitus with diabetic chronic kidney disease: Secondary | ICD-10-CM | POA: Diagnosis not present

## 2018-01-05 DIAGNOSIS — E782 Mixed hyperlipidemia: Secondary | ICD-10-CM | POA: Diagnosis not present

## 2018-01-05 DIAGNOSIS — N184 Chronic kidney disease, stage 4 (severe): Secondary | ICD-10-CM | POA: Diagnosis not present

## 2018-01-05 DIAGNOSIS — E11319 Type 2 diabetes mellitus with unspecified diabetic retinopathy without macular edema: Secondary | ICD-10-CM | POA: Diagnosis not present

## 2018-01-05 DIAGNOSIS — R809 Proteinuria, unspecified: Secondary | ICD-10-CM | POA: Diagnosis not present

## 2018-04-22 DIAGNOSIS — Z794 Long term (current) use of insulin: Secondary | ICD-10-CM | POA: Diagnosis not present

## 2018-04-22 DIAGNOSIS — E782 Mixed hyperlipidemia: Secondary | ICD-10-CM | POA: Diagnosis not present

## 2018-04-22 DIAGNOSIS — E1122 Type 2 diabetes mellitus with diabetic chronic kidney disease: Secondary | ICD-10-CM | POA: Diagnosis not present

## 2018-04-22 DIAGNOSIS — N184 Chronic kidney disease, stage 4 (severe): Secondary | ICD-10-CM | POA: Diagnosis not present

## 2018-04-22 LAB — BASIC METABOLIC PANEL
BUN: 23 — AB (ref 4–21)
CREATININE: 2 — AB (ref 0.5–1.1)
Glucose: 65
Potassium: 4.4 (ref 3.4–5.3)
Sodium: 142 (ref 137–147)

## 2018-04-22 LAB — HEPATIC FUNCTION PANEL
ALK PHOS: 79 (ref 25–125)
ALT: 11 (ref 7–35)
AST: 11 — AB (ref 13–35)
Bilirubin, Total: 0.4

## 2018-04-22 LAB — LIPID PANEL
CHOLESTEROL: 210 — AB (ref 0–200)
HDL: 40 (ref 35–70)
LDL Cholesterol: 134
Triglycerides: 176 — AB (ref 40–160)

## 2018-04-22 LAB — HEMOGLOBIN A1C: Hemoglobin A1C: 7.7

## 2018-04-28 DIAGNOSIS — E1129 Type 2 diabetes mellitus with other diabetic kidney complication: Secondary | ICD-10-CM | POA: Diagnosis not present

## 2018-04-28 DIAGNOSIS — E11319 Type 2 diabetes mellitus with unspecified diabetic retinopathy without macular edema: Secondary | ICD-10-CM | POA: Diagnosis not present

## 2018-04-28 DIAGNOSIS — E1121 Type 2 diabetes mellitus with diabetic nephropathy: Secondary | ICD-10-CM | POA: Diagnosis not present

## 2018-04-28 DIAGNOSIS — E1122 Type 2 diabetes mellitus with diabetic chronic kidney disease: Secondary | ICD-10-CM | POA: Diagnosis not present

## 2018-04-28 DIAGNOSIS — Z794 Long term (current) use of insulin: Secondary | ICD-10-CM | POA: Diagnosis not present

## 2018-04-28 DIAGNOSIS — N184 Chronic kidney disease, stage 4 (severe): Secondary | ICD-10-CM | POA: Diagnosis not present

## 2018-04-28 DIAGNOSIS — E782 Mixed hyperlipidemia: Secondary | ICD-10-CM | POA: Diagnosis not present

## 2018-04-28 DIAGNOSIS — R809 Proteinuria, unspecified: Secondary | ICD-10-CM | POA: Diagnosis not present

## 2018-05-14 ENCOUNTER — Telehealth: Payer: Self-pay

## 2018-05-14 ENCOUNTER — Ambulatory Visit (INDEPENDENT_AMBULATORY_CARE_PROVIDER_SITE_OTHER): Payer: PPO | Admitting: Family Medicine

## 2018-05-14 ENCOUNTER — Encounter: Payer: Self-pay | Admitting: Family Medicine

## 2018-05-14 ENCOUNTER — Ambulatory Visit
Admission: RE | Admit: 2018-05-14 | Discharge: 2018-05-14 | Disposition: A | Discharge status: Home / Self Care | Payer: PPO | Attending: Family Medicine | Admitting: Family Medicine

## 2018-05-14 ENCOUNTER — Ambulatory Visit
Admission: RE | Admit: 2018-05-14 | Discharge: 2018-05-14 | Disposition: A | Discharge status: Home / Self Care | Payer: PPO | Source: Ambulatory Visit | Attending: Family Medicine | Admitting: Family Medicine

## 2018-05-14 VITALS — BP 126/60 | HR 64 | Temp 98.5°F | Resp 16 | Wt 190.0 lb

## 2018-05-14 DIAGNOSIS — E119 Type 2 diabetes mellitus without complications: Secondary | ICD-10-CM | POA: Diagnosis not present

## 2018-05-14 DIAGNOSIS — M25562 Pain in left knee: Principal | ICD-10-CM

## 2018-05-14 DIAGNOSIS — N1832 Chronic kidney disease, stage 3b: Secondary | ICD-10-CM | POA: Insufficient documentation

## 2018-05-14 DIAGNOSIS — G8929 Other chronic pain: Secondary | ICD-10-CM | POA: Diagnosis not present

## 2018-05-14 DIAGNOSIS — M25569 Pain in unspecified knee: Secondary | ICD-10-CM | POA: Diagnosis not present

## 2018-05-14 DIAGNOSIS — N183 Chronic kidney disease, stage 3 unspecified: Secondary | ICD-10-CM

## 2018-05-14 DIAGNOSIS — Z794 Long term (current) use of insulin: Secondary | ICD-10-CM | POA: Diagnosis not present

## 2018-05-14 DIAGNOSIS — N184 Chronic kidney disease, stage 4 (severe): Secondary | ICD-10-CM | POA: Insufficient documentation

## 2018-05-14 DIAGNOSIS — M1712 Unilateral primary osteoarthritis, left knee: Secondary | ICD-10-CM | POA: Diagnosis not present

## 2018-05-14 MED ORDER — DICLOFENAC SODIUM 1 % TD GEL: 4.0000 g | Freq: Four times a day (QID) | TRANSDERMAL | 1 refills | Status: DC

## 2018-05-14 NOTE — Patient Instructions (Signed)
Go to the [facility] on [facility] for left knee Xray  . Please review the attached list of medications and notify my office if there are any errors.   . Please bring all of your medications to every appointment so we can make sure that our medication list is the same as yours.

## 2018-05-14 NOTE — Progress Notes (Signed)
Patient: Margaret Payne Female    DOB: 03-Jan-1941   78 y.o.   MRN: [id number] Visit Date: 05/14/2018  Today's Provider: Lelon Huh, MD   Chief Complaint  Patient presents with  . Knee Pain    Left knee; started about two months ago.    Subjective:     Knee Pain   Incident onset: Started at Thanksgiving time. There was no injury mechanism. The pain is present in the left knee. The quality of the pain is described as aching and shooting. The pain has been worsening since onset. Associated symptoms include an inability to bear weight and a loss of motion. Pertinent negatives include no loss of sensation, muscle weakness, numbness or tingling. She reports no foreign bodies present. The symptoms are aggravated by weight bearing and movement. She has tried acetaminophen and NSAIDs for the symptoms. The treatment provided no relief.  Started day after shopping on [facility] Friday. Was taking ibuprofen which helped a little bit, but stopped due to elevated created when she saw Dr. Gabriel Carina.   Allergies  Allergen Reactions  . Codeine Sulfate Nausea Only     Current Outpatient Medications:  .  Cholecalciferol (VITAMIN D3) 5000 units TABS, Take 5,000 Units by mouth daily., Disp: , Rfl:  .  glipiZIDE (GLUCOTROL XL) 10 MG 24 hr tablet, Take 10 mg by mouth 2 (two) times daily. , Disp: , Rfl:  .  insulin aspart protamine- aspart (NOVOLOG MIX 70/30) (70-30) 100 UNIT/ML injection, Inject into the skin 2 (two) times daily with a meal. , Disp: , Rfl:  .  lisinopril (PRINIVIL,ZESTRIL) 40 MG tablet, Take 40 mg by mouth daily., Disp: , Rfl:  .  Multiple Vitamin (MULTI-VITAMINS) TABS, Take by mouth. One a day vitamin- womens health, Disp: , Rfl:  .  propranolol (INDERAL) 20 MG tablet, Take 20 mg by mouth 2 (two) times daily., Disp: , Rfl:  .  pravastatin (PRAVACHOL) 80 MG tablet, Take 80 mg by mouth daily. , Disp: , Rfl:   Review of Systems  Constitutional: Negative.   Musculoskeletal: Positive for  arthralgias, gait problem and joint swelling. Negative for back pain, myalgias, neck pain and neck stiffness.  Neurological: Negative for dizziness, tingling, numbness and headaches.    Social History   Tobacco Use  . Smoking status: Never Smoker  . Smokeless tobacco: Never Used  Substance Use Topics  . Alcohol use: No    Alcohol/week: 0.0 standard drinks      Objective:   BP 126/60 (BP Location: Right Arm, Patient Position: Sitting, Cuff Size: Large)   Pulse 64   Temp 98.5 F (36.9 C) (Oral)   Resp 16   Wt 190 lb (86.2 kg)   BMI 35.90 kg/m  Vitals:   05/14/18 0859  BP: 126/60  Pulse: 64  Resp: 16  Temp: 98.5 F (36.9 C)  TempSrc: Oral  Weight: 190 lb (86.2 kg)     Physical Exam  General appearance: alert, well developed, well nourished, cooperative and in no distress Head: Normocephalic, without obvious abnormality, atraumatic Respiratory: Respirations even and unlabored, normal respiratory rate Extremities: Tender and swollen around left knee joint line medially and laterally. No warm to touch. Negative McMurray's sign.   Dg Knee Complete 4 Views Left  Result Date: 05/14/2018 CLINICAL DATA:  Left knee pain for 2 months.  No known injury. EXAM: LEFT KNEE - COMPLETE 4+ VIEW COMPARISON:  None. FINDINGS: There is no acute bony or joint abnormality. Degenerative changes  seen about the knee with joint space narrowing most notable in the medial compartment. Small osteophytes are seen about all 3 compartments. No joint effusion. IMPRESSION: No acute disease. 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 Electronically Signed   By: Inge Rise M.D.   On: 05/14/2018 10:45       Assessment & Plan    1. Chronic pain of left knee  - DG Knee Complete 4 Views Left; Future  Avoid oral NSAIDs due to CKD, can continue tylenol. Encouraged use of OTC elastic knee wrap when ambulating. Discussed trial of topical NSAID or steroid  injection.  Will try diclofenac gel and she is to call for orthopedic referral if not improved within 2 weeks.    2. Type 2 diabetes mellitus without complication, with long-term current use of insulin ([place]) Reviewed most recent labs by Dr. Gabriel Carina  3. Chronic kidney disease (CKD), stage III (moderate) (HCC) Reviewed most recent labs by Dr. Gabriel Carina with Cr=2.0       Lelon Huh, MD  [facility]

## 2018-05-14 NOTE — Telephone Encounter (Signed)
-----   Message from Birdie Sons, MD sent at 05/14/2018 12:33 PM EST ----- Xray shows fairly severe arthritis in knee. Recommend try diclofenac topical gel. Can send to pharmacy of her choice.  If not better in 2 weeks then call back for referral to orthopedics for cortisone injection.

## 2018-05-14 NOTE — Telephone Encounter (Signed)
Patient advised of results and agrees to try Diclofenac topical gel. She uses [address] pharmacy. Has this prescription been ordered yet?

## 2018-06-01 ENCOUNTER — Telehealth: Payer: Self-pay | Admitting: Family Medicine

## 2018-06-01 DIAGNOSIS — G8929 Other chronic pain: Secondary | ICD-10-CM

## 2018-06-01 DIAGNOSIS — M25569 Pain in unspecified knee: Principal | ICD-10-CM

## 2018-06-01 NOTE — Telephone Encounter (Signed)
OK. Please order referral to Dr. Harlow Mares for chronic knee pain

## 2018-06-01 NOTE — Telephone Encounter (Signed)
Pt wanting to let Caryn Section know her knees are not getting better.  Dr. Harlow Mares - Orthopedic Doctor  was suggested by a friend. Pt doesn't want to see Dr. Sabra Heck.  Please advise.  Thanks, [employer]

## 2018-06-08 DIAGNOSIS — M170 Bilateral primary osteoarthritis of knee: Secondary | ICD-10-CM | POA: Diagnosis not present

## 2018-06-08 DIAGNOSIS — M1712 Unilateral primary osteoarthritis, left knee: Secondary | ICD-10-CM | POA: Diagnosis not present

## 2018-06-15 DIAGNOSIS — R809 Proteinuria, unspecified: Secondary | ICD-10-CM | POA: Diagnosis not present

## 2018-06-15 DIAGNOSIS — E1129 Type 2 diabetes mellitus with other diabetic kidney complication: Secondary | ICD-10-CM | POA: Diagnosis not present

## 2018-06-15 DIAGNOSIS — N184 Chronic kidney disease, stage 4 (severe): Secondary | ICD-10-CM | POA: Diagnosis not present

## 2018-06-15 DIAGNOSIS — Z794 Long term (current) use of insulin: Secondary | ICD-10-CM | POA: Diagnosis not present

## 2018-06-15 DIAGNOSIS — E11319 Type 2 diabetes mellitus with unspecified diabetic retinopathy without macular edema: Secondary | ICD-10-CM | POA: Diagnosis not present

## 2018-06-15 DIAGNOSIS — E1122 Type 2 diabetes mellitus with diabetic chronic kidney disease: Secondary | ICD-10-CM | POA: Diagnosis not present

## 2018-06-15 DIAGNOSIS — E11649 Type 2 diabetes mellitus with hypoglycemia without coma: Secondary | ICD-10-CM | POA: Diagnosis not present

## 2018-06-15 DIAGNOSIS — E1121 Type 2 diabetes mellitus with diabetic nephropathy: Secondary | ICD-10-CM | POA: Diagnosis not present

## 2018-07-29 DIAGNOSIS — N184 Chronic kidney disease, stage 4 (severe): Secondary | ICD-10-CM | POA: Diagnosis not present

## 2018-07-29 DIAGNOSIS — E1122 Type 2 diabetes mellitus with diabetic chronic kidney disease: Secondary | ICD-10-CM | POA: Diagnosis not present

## 2018-07-29 DIAGNOSIS — Z794 Long term (current) use of insulin: Secondary | ICD-10-CM | POA: Diagnosis not present

## 2018-08-11 DIAGNOSIS — E1121 Type 2 diabetes mellitus with diabetic nephropathy: Secondary | ICD-10-CM | POA: Diagnosis not present

## 2018-08-11 DIAGNOSIS — E11319 Type 2 diabetes mellitus with unspecified diabetic retinopathy without macular edema: Secondary | ICD-10-CM | POA: Diagnosis not present

## 2018-08-11 DIAGNOSIS — Z794 Long term (current) use of insulin: Secondary | ICD-10-CM | POA: Diagnosis not present

## 2018-08-11 DIAGNOSIS — N184 Chronic kidney disease, stage 4 (severe): Secondary | ICD-10-CM | POA: Diagnosis not present

## 2018-08-11 DIAGNOSIS — E1129 Type 2 diabetes mellitus with other diabetic kidney complication: Secondary | ICD-10-CM | POA: Diagnosis not present

## 2018-08-11 DIAGNOSIS — E782 Mixed hyperlipidemia: Secondary | ICD-10-CM | POA: Diagnosis not present

## 2018-08-11 DIAGNOSIS — R809 Proteinuria, unspecified: Secondary | ICD-10-CM | POA: Diagnosis not present

## 2018-08-11 DIAGNOSIS — E1122 Type 2 diabetes mellitus with diabetic chronic kidney disease: Secondary | ICD-10-CM | POA: Diagnosis not present

## 2018-09-01 ENCOUNTER — Encounter: Payer: Self-pay | Admitting: Family Medicine

## 2018-09-01 DIAGNOSIS — E119 Type 2 diabetes mellitus without complications: Secondary | ICD-10-CM | POA: Diagnosis not present

## 2018-09-01 LAB — HM DIABETES EYE EXAM

## 2018-11-10 DIAGNOSIS — E1122 Type 2 diabetes mellitus with diabetic chronic kidney disease: Secondary | ICD-10-CM | POA: Diagnosis not present

## 2018-11-10 DIAGNOSIS — N184 Chronic kidney disease, stage 4 (severe): Secondary | ICD-10-CM | POA: Diagnosis not present

## 2018-11-10 DIAGNOSIS — E782 Mixed hyperlipidemia: Secondary | ICD-10-CM | POA: Diagnosis not present

## 2018-11-10 DIAGNOSIS — Z794 Long term (current) use of insulin: Secondary | ICD-10-CM | POA: Diagnosis not present

## 2018-11-12 ENCOUNTER — Ambulatory Visit: Payer: PPO

## 2018-11-16 DIAGNOSIS — M1712 Unilateral primary osteoarthritis, left knee: Secondary | ICD-10-CM | POA: Diagnosis not present

## 2018-11-17 DIAGNOSIS — Z794 Long term (current) use of insulin: Secondary | ICD-10-CM | POA: Diagnosis not present

## 2018-11-17 DIAGNOSIS — E11319 Type 2 diabetes mellitus with unspecified diabetic retinopathy without macular edema: Secondary | ICD-10-CM | POA: Diagnosis not present

## 2018-11-17 DIAGNOSIS — E1129 Type 2 diabetes mellitus with other diabetic kidney complication: Secondary | ICD-10-CM | POA: Diagnosis not present

## 2018-11-17 DIAGNOSIS — R809 Proteinuria, unspecified: Secondary | ICD-10-CM | POA: Diagnosis not present

## 2018-11-17 DIAGNOSIS — E1121 Type 2 diabetes mellitus with diabetic nephropathy: Secondary | ICD-10-CM | POA: Diagnosis not present

## 2018-11-17 DIAGNOSIS — N184 Chronic kidney disease, stage 4 (severe): Secondary | ICD-10-CM | POA: Diagnosis not present

## 2018-11-17 DIAGNOSIS — E1122 Type 2 diabetes mellitus with diabetic chronic kidney disease: Secondary | ICD-10-CM | POA: Diagnosis not present

## 2018-11-22 ENCOUNTER — Ambulatory Visit (INDEPENDENT_AMBULATORY_CARE_PROVIDER_SITE_OTHER): Payer: PPO

## 2018-11-22 ENCOUNTER — Other Ambulatory Visit: Payer: Self-pay

## 2018-11-22 DIAGNOSIS — Z0000 Encounter for general adult medical examination without abnormal findings: Secondary | ICD-10-CM | POA: Diagnosis not present

## 2018-11-22 NOTE — Progress Notes (Signed)
Subjective:   Margaret Payne is a 78 y.o. female who presents for Medicare Annual (Subsequent) preventive examination.    This visit is being conducted through telemedicine due to the COVID-19 pandemic. This patient has given me verbal consent via doximity to conduct this visit, patient states they are participating from their home address. Some vital signs may be absent or patient reported.    Patient identification: identified by name, DOB, and current address  Review of Systems:  N/A        Objective:     Vitals: There were no vitals taken for this visit.  There is no height or weight on file to calculate BMI. Unable to obtain vitals due to visit being conducted via telephonically.   Advanced Directives 11/10/2017 11/07/2016  Does Patient Have a Medical Advance Directive? Yes Yes  Type of Paramedic of [place];Living will [place];Living will  Copy of [place] in Chart? No - copy requested No - copy requested    Tobacco Social History   Tobacco Use  Smoking Status Never Smoker  Smokeless Tobacco Never Used     Counseling given: Not Answered   Clinical Intake:                  Diabetes:  Is the patient diabetic?  Yes type 2 If diabetic, was a CBG obtained today?  No  Did the patient bring in their glucometer from home?  No  How often do you monitor your CBG's? Twice a day.   Financial Strains and Diabetes Management:  Are you having any financial strains with the device, your supplies or your medication? No .  Does the patient want to be seen by Chronic Care Management for management of their diabetes?  No  Would the patient like to be referred to a Nutritionist or for Diabetic Management?  No   Diabetic Exams:  Diabetic Eye Exam: Completed 09/01/18. Repeat yearly.  Diabetic Foot Exam: Completed 05/08/18. Repeat yearly.         Past Medical History:  Diagnosis Date  .  Diabetes mellitus without complication ([place])    type 2  . Headache    migrains  . HOH (hard of hearing)   . Hyperlipidemia   . Hypertension    Past Surgical History:  Procedure Laterality Date  . ABDOMINAL HYSTERECTOMY    . BREAST BIOPSY    . CATARACT EXTRACTION W/PHACO Right 10/03/2014   Procedure: CATARACT EXTRACTION PHACO AND INTRAOCULAR LENS PLACEMENT (IOC);  Surgeon: Birder Robson, MD;  Location: [facility] ORS;  Service: Ophthalmology;  Laterality: Right;  [place] 00:57 AP% 16.7 CDE 9.52 Fluid pack PPI#[id number] H   Family History  Problem Relation Age of Onset  . Breast cancer Mother   . Emphysema Father   . Breast cancer Sister 12   Social History   Socioeconomic History  . Marital status: Single    Spouse name: Not on file  . Number of children: 1  . Years of education: Not on file  . Highest education level: Associate degree: occupational, Hotel manager, or vocational program  Occupational History  . Occupation: Retired  Scientific laboratory technician  . Financial resource strain: Not hard at all  . Food insecurity    Worry: Never true    Inability: Never true  . Transportation needs    Medical: No    Non-medical: No  Tobacco Use  . Smoking status: Never Smoker  . Smokeless tobacco: Never Used  Substance  and Sexual Activity  . Alcohol use: No    Alcohol/week: 0.0 standard drinks  . Drug use: No  . Sexual activity: Not on file  Lifestyle  . Physical activity    Days per week: Not on file    Minutes per session: Not on file  . Stress: Not at all  Relationships  . Social Herbalist on phone: Not on file    Gets together: Not on file    Attends religious service: Not on file    Active member of club or organization: Not on file    Attends meetings of clubs or organizations: Not on file    Relationship status: Not on file  Other Topics Concern  . Not on file  Social History Narrative  . Not on file    Outpatient Encounter Medications as of 11/22/2018  Medication Sig   . Cholecalciferol (VITAMIN D3) 5000 units TABS Take 5,000 Units by mouth daily.  . diclofenac sodium (VOLTAREN) 1 % GEL Apply 4 g topically 4 (four) times daily.  [place] Kitchen glipiZIDE (GLUCOTROL XL) 10 MG 24 hr tablet Take 10 mg by mouth 2 (two) times daily.   . insulin aspart protamine- aspart (NOVOLOG MIX 70/30) (70-30) 100 UNIT/ML injection Inject into the skin 2 (two) times daily with a meal.   . lisinopril (PRINIVIL,ZESTRIL) 40 MG tablet Take 40 mg by mouth daily.  . Multiple Vitamin (MULTI-VITAMINS) TABS Take by mouth. One a day vitamin- womens health  . pravastatin (PRAVACHOL) 80 MG tablet Take 80 mg by mouth daily.   . propranolol (INDERAL) 20 MG tablet Take 20 mg by mouth 2 (two) times daily.   No facility-administered encounter medications on file as of 11/22/2018.     Activities of Daily Living No flowsheet data found.  Patient Care Team: Birdie Sons, MD as PCP - General (Family Medicine) Gabriel Carina Betsey Holiday, MD as Physician Assistant (Internal Medicine) Birder Robson, MD as Referring Physician (Ophthalmology)    Assessment:   This is a routine wellness examination for Margaret Payne.  Exercise Activities and Dietary recommendations    Goals    . DIET - EAT MORE FRUITS AND VEGETABLES     Recommend eating 2-3 servings of vegetables a day.     . Eat more fruits and vegetables     Recommend to eat more fruits and vegetables, at least 2 servings of each a day.       Fall Risk: Fall Risk  11/10/2017 11/07/2016 10/11/2015  Falls in the past year? No No No    FALL RISK PREVENTION PERTAINING TO THE HOME:  Any stairs in or around the home? Yes  If so, are there any without handrails? No   Home free of loose throw rugs in walkways, pet beds, electrical cords, etc? Yes  Adequate lighting in your home to reduce risk of falls? Yes   ASSISTIVE DEVICES UTILIZED TO PREVENT FALLS:  Life alert? No  Use of a cane, walker or w/c? Yes  Grab bars in the bathroom? Yes  Shower chair or bench  in shower? No  Elevated toilet seat or a handicapped toilet? No   TIMED UP AND GO:  Was the test performed? No .    Depression Screen PHQ 2/9 Scores 11/10/2017 11/10/2017 11/07/2016 11/07/2016  PHQ - 2 Score 0 0 0 0  PHQ- 9 Score 2 - 2 -     Cognitive Function: Declined today.      6CIT Screen 11/07/2016  What Year?  0 points  What month? 0 points  What time? 0 points  Count back from 20 0 points  Months in reverse 0 points  Repeat phrase 0 points  Total Score 0    Immunization History  Administered Date(s) Administered  . Influenza, High Dose Seasonal PF 02/23/2016, 02/15/2018  . Influenza,inj,Quad PF,6+ Mos 01/23/2017  . Pneumococcal Conjugate-13 10/11/2015  . Pneumococcal Polysaccharide-23 11/10/2017  . Zoster 04/17/2008    Qualifies for Shingles Vaccine? Yes  Zostavax completed 04/17/08. Due for Shingrix. Education has been provided regarding the importance of this vaccine. Pt has been advised to call insurance company to determine out of pocket expense. Advised may also receive vaccine at local pharmacy or Health Dept. Verbalized acceptance and understanding.  Tdap: Although this vaccine is not a covered service during a Wellness Exam, does the patient still wish to receive this vaccine today?  No .   Flu Vaccine: Up to date  Pneumococcal Vaccine: Completed series  Screening Tests Health Maintenance  Topic Date Due  . DEXA SCAN  11/14/2018  . INFLUENZA VACCINE  11/20/2018  . TETANUS/TDAP  10/20/2026 (Originally 10/19/1960)  . HEMOGLOBIN A1C  02/10/2019  . FOOT EXAM  04/29/2019  . OPHTHALMOLOGY EXAM  09/01/2019  . PNA vac Low Risk Adult  Completed    Cancer Screenings:  Colorectal Screening: No longer required.   Mammogram: No longer required.   Bone Density: Completed 11/14/15. Results reflect  OSTEOPENIA. Repeat every 3 years. Ordered today. Pt provided with contact info and advised to call to schedule appt. Pt aware the office will call re: appt.  Lung  Cancer Screening: (Low Dose CT Chest recommended if Age 71-80 years, 30 pack-year currently smoking OR have quit w/in 15years.) does not qualify.   Additional Screening:  Dental Screening: Recommended annual dental exams for proper oral hygiene   Community Resource Referral:  CRR required this visit?  No       Plan:  I have personally reviewed and addressed the Medicare Annual Wellness questionnaire and have noted the following in the patient's chart:  A. Medical and social history B. Use of alcohol, tobacco or illicit drugs  C. Current medications and supplements D. Functional ability and status E.  Nutritional status F.  Physical activity G. Advance directives H. List of other physicians I.  Hospitalizations, surgeries, and ER visits in previous 12 months J.  [place] such as hearing and vision if needed, cognitive and depression L. Referrals and appointments   In addition, I have reviewed and discussed with patient certain preventive protocols, quality metrics, and best practice recommendations. A written personalized care plan for preventive services as well as general preventive health recommendations were provided to patient.   Glendora Score, LPN  07/27/8889 Nurse Health Advisor   Nurse Notes: None.

## 2018-11-22 NOTE — Patient Instructions (Addendum)
Margaret Payne , Thank you for taking time to come for your Medicare Wellness Visit. I appreciate your ongoing commitment to your health goals. Please review the following plan we discussed and let me know if I can assist you in the future.   Screening recommendations/referrals: Colonoscopy: No longer required.  Mammogram: No longer required.  Bone Density: Ordered today. Pt provided with contact info and advised to call to schedule appt. Pt aware the office will call re: appt. Recommended yearly ophthalmology/optometry visit for glaucoma screening and checkup Recommended yearly dental visit for hygiene and checkup  Vaccinations: Influenza vaccine: Up to date Pneumococcal vaccine: Completed series Tdap vaccine: Pt declines today.  Shingles vaccine: Pt declines today.     Advanced directives: Please bring a copy of your POA (Power of Attorney) and/or Living Will to your next appointment.   Conditions/risks identified: Recommend to exercise for 3 days a week for at least 30 minutes at a time.   Next appointment: 01/28/19 @ 9:00 AM with Dr Caryn [place]. Declined scheduling an AWV for 2021 at this time.    Preventive Care 77 Years and Older, Female Preventive care refers to lifestyle choices and visits with your health care provider that can promote health and wellness. What does preventive care include?  A yearly physical exam. This is also called an annual well check.  Dental exams once or twice a year.  Routine eye exams. Ask your health care provider how often you should have your eyes checked.  Personal lifestyle choices, including:  Daily care of your teeth and gums.  Regular physical activity.  Eating a healthy diet.  Avoiding tobacco and drug use.  Limiting alcohol use.  Practicing safe sex.  Taking low-dose aspirin every day.  Taking vitamin and mineral supplements as recommended by your health care provider. What happens during an annual well check? The services and  screenings done by your health care provider during your annual well check will depend on your age, overall health, lifestyle risk factors, and family history of disease. Counseling  Your health care provider may ask you questions about your:  Alcohol use.  Tobacco use.  Drug use.  Emotional well-being.  Home and relationship well-being.  Sexual activity.  Eating habits.  History of falls.  Memory and ability to understand (cognition).  Work and work Statistician.  Reproductive health. Screening  You may have the following tests or measurements:  Height, weight, and BMI.  Blood pressure.  Lipid and cholesterol levels. These may be checked every 5 years, or more frequently if you are over 77 years old.  Skin check.  Lung cancer screening. You may have this screening every year starting at age 27 if you have a 30-pack-year history of smoking and currently smoke or have quit within the past 15 years.  Fecal occult blood test (FOBT) of the stool. You may have this test every year starting at age 77.  Flexible sigmoidoscopy or colonoscopy. You may have a sigmoidoscopy every 5 years or a colonoscopy every 10 years starting at age 77.  Hepatitis C blood test.  Hepatitis B blood test.  Sexually transmitted disease (STD) testing.  Diabetes screening. This is done by checking your blood sugar (glucose) after you have not eaten for a while (fasting). You may have this done every 1-3 years.  Bone density scan. This is done to screen for osteoporosis. You may have this done starting at age 60.  Mammogram. This may be done every 1-2 years. Talk to your health  care provider about how often you should have regular mammograms. Talk with your health care provider about your test results, treatment options, and if necessary, the need for more tests. Vaccines  Your health care provider may recommend certain vaccines, such as:  Influenza vaccine. This is recommended every year.   Tetanus, diphtheria, and acellular pertussis (Tdap, Td) vaccine. You may need a Td booster every 10 years.  Zoster vaccine. You may need this after age 77.  Pneumococcal 13-valent conjugate (PCV13) vaccine. One dose is recommended after age 77.  Pneumococcal polysaccharide (PPSV23) vaccine. One dose is recommended after age 60. Talk to your health care provider about which screenings and vaccines you need and how often you need them. This information is not intended to replace advice given to you by your health care provider. Make sure you discuss any questions you have with your health care provider. Document Released: 05/04/2015 Document Revised: 12/26/2015 Document Reviewed: 02/06/2015 Elsevier Interactive Patient Education  2017 [place] Prevention in the Home Falls can cause injuries. They can happen to people of all ages. There are many things you can do to make your home safe and to help prevent falls. What can I do on the outside of my home?  Regularly fix the edges of walkways and driveways and fix any cracks.  Remove anything that might make you trip as you walk through a door, such as a raised step or threshold.  Trim any bushes or trees on the path to your home.  Use bright outdoor lighting.  Clear any walking paths of anything that might make someone trip, such as rocks or tools.  Regularly check to see if handrails are loose or broken. Make sure that both sides of any steps have handrails.  Any raised decks and porches should have guardrails on the edges.  Have any leaves, snow, or ice cleared regularly.  Use sand or salt on walking paths during winter.  Clean up any spills in your garage right away. This includes oil or grease spills. What can I do in the bathroom?  Use night lights.  Install grab bars by the toilet and in the tub and shower. Do not use towel bars as grab bars.  Use non-skid mats or decals in the tub or shower.  If you need to sit  down in the shower, use a plastic, non-slip stool.  Keep the floor dry. Clean up any water that spills on the floor as soon as it happens.  Remove soap buildup in the tub or shower regularly.  Attach bath mats securely with double-sided non-slip rug tape.  Do not have throw rugs and other things on the floor that can make you trip. What can I do in the bedroom?  Use night lights.  Make sure that you have a light by your bed that is easy to reach.  Do not use any sheets or blankets that are too big for your bed. They should not hang down onto the floor.  Have a firm chair that has side arms. You can use this for support while you get dressed.  Do not have throw rugs and other things on the floor that can make you trip. What can I do in the kitchen?  Clean up any spills right away.  Avoid walking on wet floors.  Keep items that you use a lot in easy-to-reach places.  If you need to reach something above you, use a strong step stool that has a grab  bar.  Keep electrical cords out of the way.  Do not use floor polish or wax that makes floors slippery. If you must use wax, use non-skid floor wax.  Do not have throw rugs and other things on the floor that can make you trip. What can I do with my stairs?  Do not leave any items on the stairs.  Make sure that there are handrails on both sides of the stairs and use them. Fix handrails that are broken or loose. Make sure that handrails are as long as the stairways.  Check any carpeting to make sure that it is firmly attached to the stairs. Fix any carpet that is loose or worn.  Avoid having throw rugs at the top or bottom of the stairs. If you do have throw rugs, attach them to the floor with carpet tape.  Make sure that you have a light switch at the top of the stairs and the bottom of the stairs. If you do not have them, ask someone to add them for you. What else can I do to help prevent falls?  Wear shoes that:  Do not have  high heels.  Have rubber bottoms.  Are comfortable and fit you well.  Are closed at the toe. Do not wear sandals.  If you use a stepladder:  Make sure that it is fully opened. Do not climb a closed stepladder.  Make sure that both sides of the stepladder are locked into place.  Ask someone to hold it for you, if possible.  Clearly mark and make sure that you can see:  Any grab bars or handrails.  First and last steps.  Where the edge of each step is.  Use tools that help you move around (mobility aids) if they are needed. These include:  Canes.  Walkers.  Scooters.  Crutches.  Turn on the lights when you go into a dark area. Replace any light bulbs as soon as they burn out.  Set up your furniture so you have a clear path. Avoid moving your furniture around.  If any of your floors are uneven, fix them.  If there are any pets around you, be aware of where they are.  Review your medicines with your doctor. Some medicines can make you feel dizzy. This can increase your chance of falling. Ask your doctor what other things that you can do to help prevent falls. This information is not intended to replace advice given to you by your health care provider. Make sure you discuss any questions you have with your health care provider. Document Released: 02/01/2009 Document Revised: 09/13/2015 Document Reviewed: 05/12/2014 Elsevier Interactive Patient Education  2017 [employer].

## 2019-01-03 ENCOUNTER — Other Ambulatory Visit: Payer: Self-pay | Admitting: Nephrology

## 2019-01-03 DIAGNOSIS — R809 Proteinuria, unspecified: Secondary | ICD-10-CM | POA: Diagnosis not present

## 2019-01-03 DIAGNOSIS — N184 Chronic kidney disease, stage 4 (severe): Secondary | ICD-10-CM | POA: Diagnosis not present

## 2019-01-03 DIAGNOSIS — E1121 Type 2 diabetes mellitus with diabetic nephropathy: Secondary | ICD-10-CM | POA: Diagnosis not present

## 2019-01-03 DIAGNOSIS — I129 Hypertensive chronic kidney disease with stage 1 through stage 4 chronic kidney disease, or unspecified chronic kidney disease: Secondary | ICD-10-CM | POA: Diagnosis not present

## 2019-01-03 DIAGNOSIS — E1122 Type 2 diabetes mellitus with diabetic chronic kidney disease: Secondary | ICD-10-CM | POA: Diagnosis not present

## 2019-01-27 NOTE — Progress Notes (Deleted)
Patient: Margaret Payne, Female    DOB: [birth year], 78 y.o.   MRN: [id number] Visit Date: 01/27/2019  Today's Provider: Lelon Huh, MD   No chief complaint on file.  Subjective:     Patient had AWV with [facility] on 11/22/2018.   Complete Physical Margaret Payne is a 78 y.o. female. She feels {DESC; WELL/FAIRLY WELL/POORLY:18703}. She reports exercising ***. She reports she is sleeping {DESC; WELL/FAIRLY WELL/POORLY:18703}.  -----------------------------------------------------------   Review of Systems  Social History   Socioeconomic History  . Marital status: Single    Spouse name: Not on file  . Number of children: 1  . Years of education: Not on file  . Highest education level: Associate degree: occupational, Hotel manager, or vocational program  Occupational History  . Occupation: Retired  Scientific laboratory technician  . Financial resource strain: Not hard at all  . Food insecurity    Worry: Never true    Inability: Never true  . Transportation needs    Medical: No    Non-medical: No  Tobacco Use  . Smoking status: Never Smoker  . Smokeless tobacco: Never Used  Substance and Sexual Activity  . Alcohol use: No    Alcohol/week: 0.0 standard drinks  . Drug use: No  . Sexual activity: Not on file  Lifestyle  . Physical activity    Days per week: 0 days    Minutes per session: 0 min  . Stress: Not at all  Relationships  . Social Herbalist on phone: Patient refused    Gets together: Patient refused    Attends religious service: Patient refused    Active member of club or organization: Patient refused    Attends meetings of clubs or organizations: Patient refused    Relationship status: Patient refused  . Intimate partner violence    Fear of current or ex partner: Patient refused    Emotionally abused: Patient refused    Physically abused: Patient refused    Forced sexual activity: Patient refused  Other Topics Concern  . Not on file  Social History  Narrative  . Not on file    Past Medical History:  Diagnosis Date  . Arthritis   . Diabetes mellitus without complication ([place])    type 2  . Headache    migrains  . HOH (hard of hearing)   . Hyperlipidemia   . Hypertension      Patient Active Problem List   Diagnosis Date Noted  . Chronic kidney disease (CKD), stage III (moderate) 05/14/2018  . Osteopenia 11/21/2015  . Vitamin D deficiency 10/11/2015  . Allergic rhinitis 10/10/2015  . Type 2 diabetes mellitus with diabetic chronic kidney disease ([place]) 10/10/2015  . Hypertension 10/10/2015  . History of migraine headaches 10/10/2015  . Obesity 10/10/2015    Past Surgical History:  Procedure Laterality Date  . ABDOMINAL HYSTERECTOMY    . BREAST BIOPSY    . CATARACT EXTRACTION W/PHACO Right 10/03/2014   Procedure: CATARACT EXTRACTION PHACO AND INTRAOCULAR LENS PLACEMENT ([facility]);  Surgeon: Birder Robson, MD;  Location: [facility] ORS;  Service: Ophthalmology;  Laterality: Right;  [place] 00:57 AP% 16.7 CDE 9.52 Fluid pack lot#[id number]  . INJECTION KNEE      Her family history includes Breast cancer in her mother; Breast cancer (age of onset: 49) in her sister; Emphysema in her father.   Current Outpatient Medications:  .  Cholecalciferol (VITAMIN D3) 5000 units TABS, Take 5,000 Units by mouth daily., Disp: , Rfl:  .  diclofenac sodium (VOLTAREN) 1 % GEL, Apply 4 g topically 4 (four) times daily. (Patient not taking: Reported on 11/22/2018), Disp: 100 g, Rfl: 1 .  glipiZIDE (GLUCOTROL XL) 10 MG 24 hr tablet, Take 10 mg by mouth 2 (two) times daily. , Disp: , Rfl:  .  insulin aspart protamine- aspart (NOVOLOG MIX 70/30) (70-30) 100 UNIT/ML injection, Inject into the skin 2 (two) times daily with a meal. , Disp: , Rfl:  .  lisinopril (PRINIVIL,ZESTRIL) 40 MG tablet, Take 40 mg by mouth daily., Disp: , Rfl:  .  Multiple Vitamin (MULTI-VITAMINS) TABS, Take by mouth. One a day vitamin- womens health, Disp: , Rfl:  .  pravastatin  (PRAVACHOL) 80 MG tablet, Take 40 mg by mouth daily. , Disp: , Rfl:  .  propranolol (INDERAL) 20 MG tablet, Take 20 mg by mouth 2 (two) times daily., Disp: , Rfl:   Patient Care Team: Birdie Sons, MD as PCP - General (Family Medicine) Gabriel Carina Betsey Holiday, MD as Physician Assistant (Internal Medicine) Birder Robson, MD as Referring Physician (Ophthalmology) Lovell Sheehan, MD as Consulting Physician (Orthopedic Surgery)     Objective:    Vitals: There were no vitals taken for this visit.  Physical Exam  Activities of Daily Living In your present state of health, do you have any difficulty performing the following activities: 11/22/2018  Hearing? Y  Comment Wears a hearing aid in the left ear.  Vision? Y  Comment Due to a cataract on the left eye.  Difficulty concentrating or making decisions? N  Walking or climbing stairs? N  Dressing or bathing? N  Doing errands, shopping? N  Preparing Food and eating ? N  Using the Toilet? N  In the past six months, have you accidently leaked urine? N  Do you have problems with loss of bowel control? N  Managing your Medications? N  Managing your Finances? N  Housekeeping or managing your Housekeeping? N  Some recent data might be hidden    Fall Risk Assessment Fall Risk  11/22/2018 11/10/2017 11/07/2016 10/11/2015  Falls in the past year? 0 No No No     Depression Screen PHQ 2/9 Scores 11/22/2018 11/10/2017 11/10/2017 11/07/2016  PHQ - 2 Score 0 0 0 0  PHQ- 9 Score - 2 - 2    6CIT Screen 11/07/2016  What Year? 0 points  What month? 0 points  What time? 0 points  Count back from 20 0 points  Months in reverse 0 points  Repeat phrase 0 points  Total Score 0       Assessment & Plan:    Annual Physical Reviewed patient's Family Medical History Reviewed and updated list of patient's medical providers Assessment of cognitive impairment was done Assessed patient's functional ability Established a written schedule for health  screening [place] Completed and Reviewed  Exercise Activities and Dietary recommendations Goals    . Exercise 3x per week (30 min per time)     Recommend to exercise for 3 days a week for at least 30 minutes at a time.        Immunization History  Administered Date(s) Administered  . Influenza, High Dose Seasonal PF 02/23/2016, 02/15/2018  . Influenza,inj,Quad PF,6+ Mos 01/23/2017  . Pneumococcal Conjugate-13 10/11/2015  . Pneumococcal Polysaccharide-23 11/10/2017  . Zoster 04/17/2008    Health Maintenance  Topic Date Due  . DEXA SCAN  11/14/2018  . INFLUENZA VACCINE  11/20/2018  . TETANUS/TDAP  10/20/2026 (Originally 10/19/1959)  .  FOOT EXAM  04/29/2019  . HEMOGLOBIN A1C  05/20/2019  . OPHTHALMOLOGY EXAM  09/01/2019  . PNA vac Low Risk Adult  Completed     Discussed health benefits of physical activity, and encouraged her to engage in regular exercise appropriate for her age and condition.    ------------------------------------------------------------------------------------------------------------    Lelon Huh, MD  [place]

## 2019-01-28 ENCOUNTER — Other Ambulatory Visit: Payer: Self-pay

## 2019-01-28 ENCOUNTER — Encounter: Payer: Self-pay | Admitting: Family Medicine

## 2019-01-28 ENCOUNTER — Ambulatory Visit
Admission: RE | Admit: 2019-01-28 | Discharge: 2019-01-28 | Disposition: A | Discharge status: Home / Self Care | Payer: PPO | Source: Ambulatory Visit | Attending: Nephrology | Admitting: Nephrology

## 2019-01-28 ENCOUNTER — Ambulatory Visit (INDEPENDENT_AMBULATORY_CARE_PROVIDER_SITE_OTHER): Payer: PPO | Admitting: Family Medicine

## 2019-01-28 VITALS — BP 111/70 | HR 56 | Temp 96.9°F | Resp 16 | Wt 190.4 lb

## 2019-01-28 DIAGNOSIS — E559 Vitamin D deficiency, unspecified: Secondary | ICD-10-CM

## 2019-01-28 DIAGNOSIS — Z6834 Body mass index (BMI) 34.0-34.9, adult: Secondary | ICD-10-CM | POA: Diagnosis not present

## 2019-01-28 DIAGNOSIS — Z794 Long term (current) use of insulin: Secondary | ICD-10-CM

## 2019-01-28 DIAGNOSIS — Z8669 Personal history of other diseases of the nervous system and sense organs: Secondary | ICD-10-CM | POA: Diagnosis not present

## 2019-01-28 DIAGNOSIS — Z23 Encounter for immunization: Secondary | ICD-10-CM | POA: Diagnosis not present

## 2019-01-28 DIAGNOSIS — E1121 Type 2 diabetes mellitus with diabetic nephropathy: Secondary | ICD-10-CM

## 2019-01-28 DIAGNOSIS — N184 Chronic kidney disease, stage 4 (severe): Secondary | ICD-10-CM | POA: Diagnosis not present

## 2019-01-28 DIAGNOSIS — I10 Essential (primary) hypertension: Secondary | ICD-10-CM | POA: Diagnosis not present

## 2019-01-28 DIAGNOSIS — M8580 Other specified disorders of bone density and structure, unspecified site: Secondary | ICD-10-CM | POA: Diagnosis not present

## 2019-01-28 DIAGNOSIS — Z0000 Encounter for general adult medical examination without abnormal findings: Secondary | ICD-10-CM

## 2019-01-28 DIAGNOSIS — E669 Obesity, unspecified: Secondary | ICD-10-CM

## 2019-01-28 DIAGNOSIS — N189 Chronic kidney disease, unspecified: Secondary | ICD-10-CM | POA: Diagnosis not present

## 2019-01-28 DIAGNOSIS — H9190 Unspecified hearing loss, unspecified ear: Secondary | ICD-10-CM | POA: Insufficient documentation

## 2019-01-28 IMAGING — US US RENAL
1 series · 14 of 25 positions shown · non-contrast
Comparison: None.

CLINICAL DATA: Chronic renal disease.

EXAM:
RENAL / URINARY TRACT ULTRASOUND COMPLETE

[Series 1: us renal · 0.30mm/px · 14 of 39 slices shown]
[im 1/39]
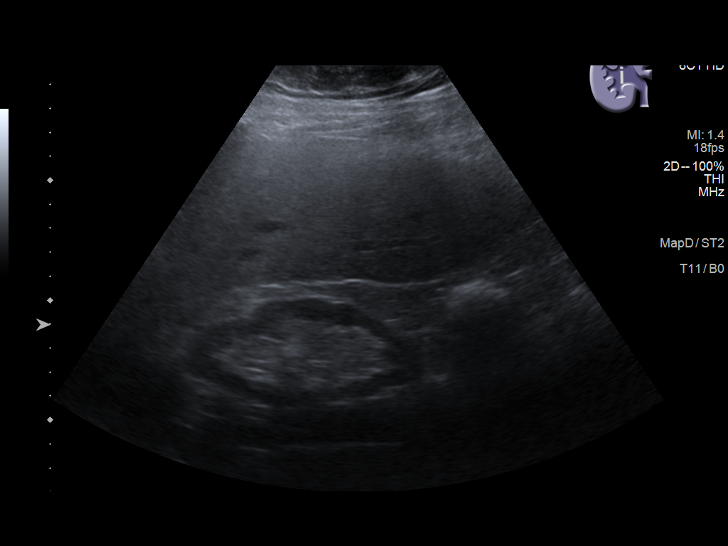
[im 4/39]
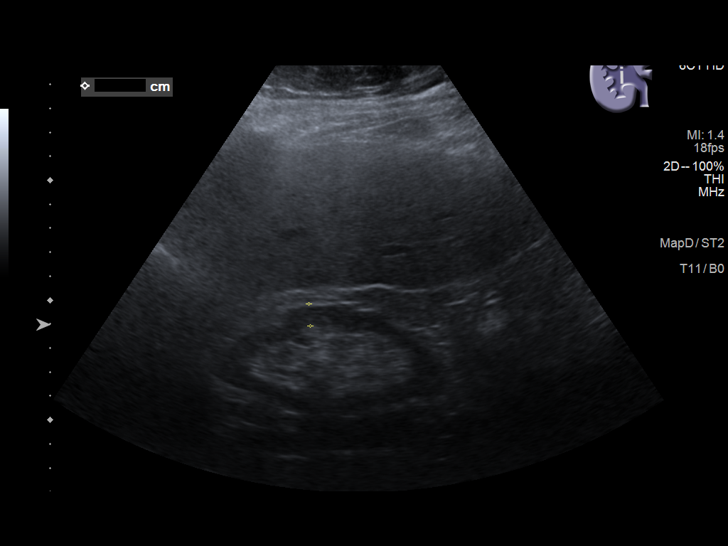
[im 7/39]
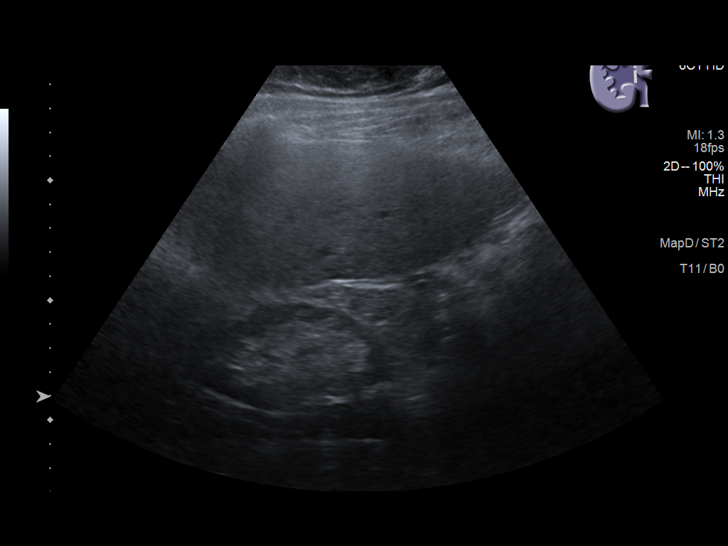
[im 10/39]
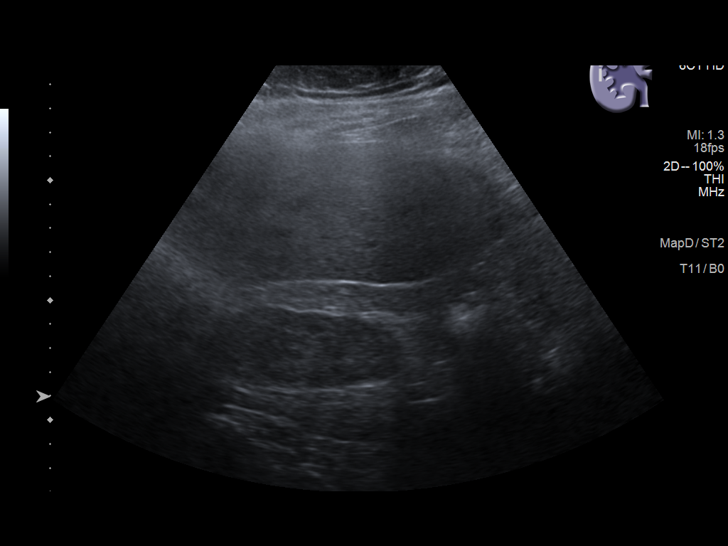
[im 13/39]
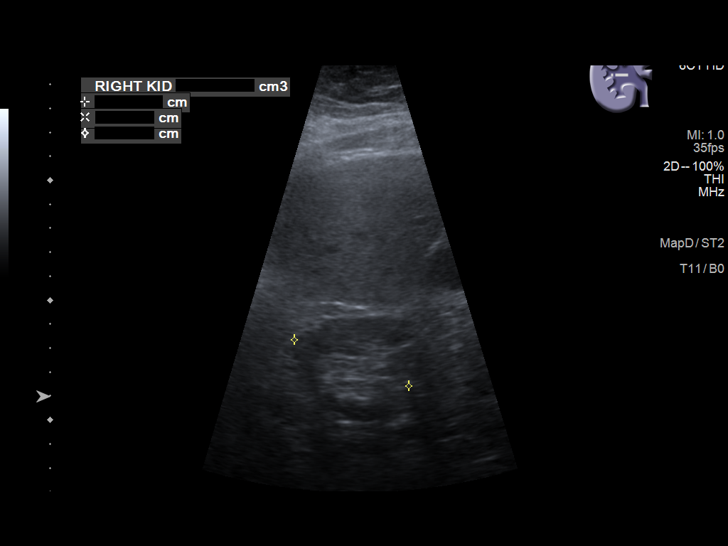
[im 15/39]
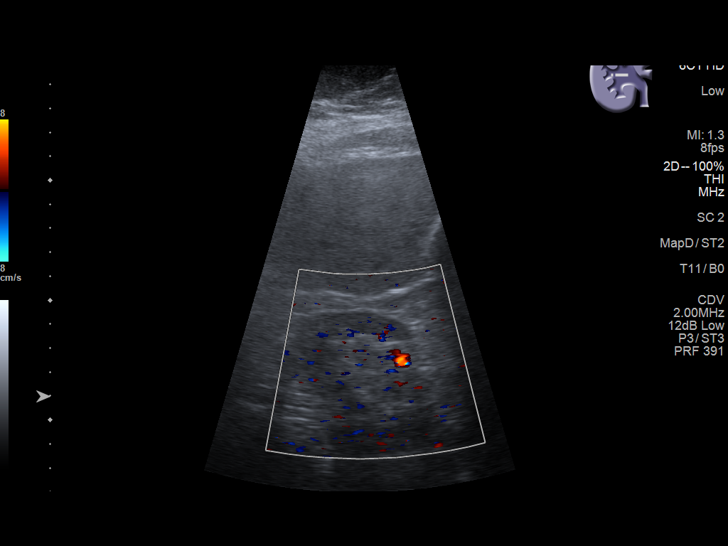
[im 18/39]
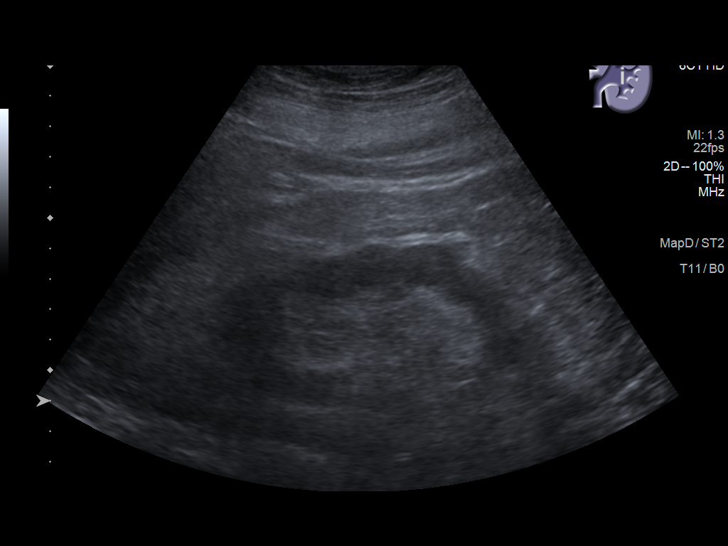
[im 21/39]
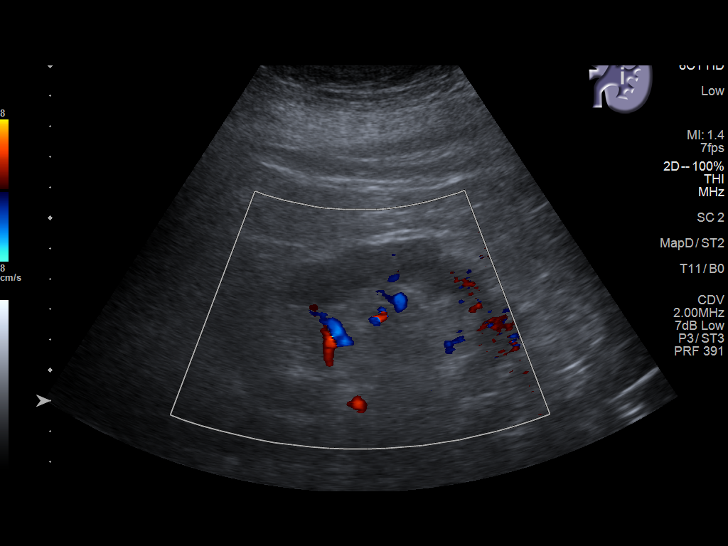
[im 24/39]
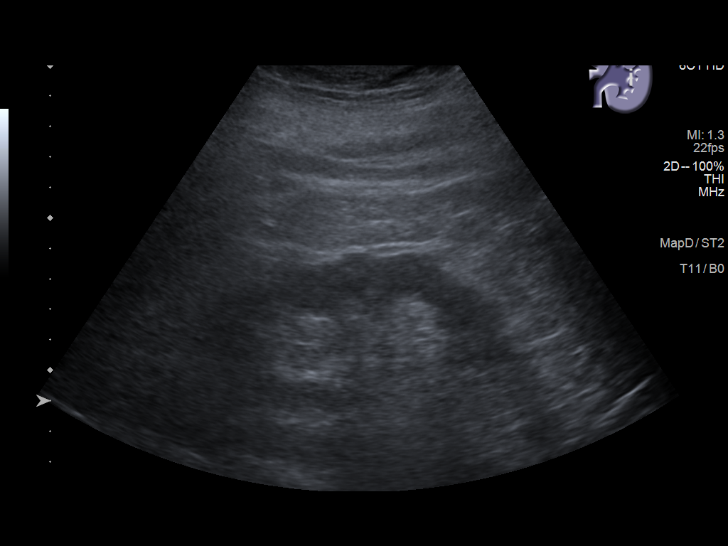
[im 26/39]
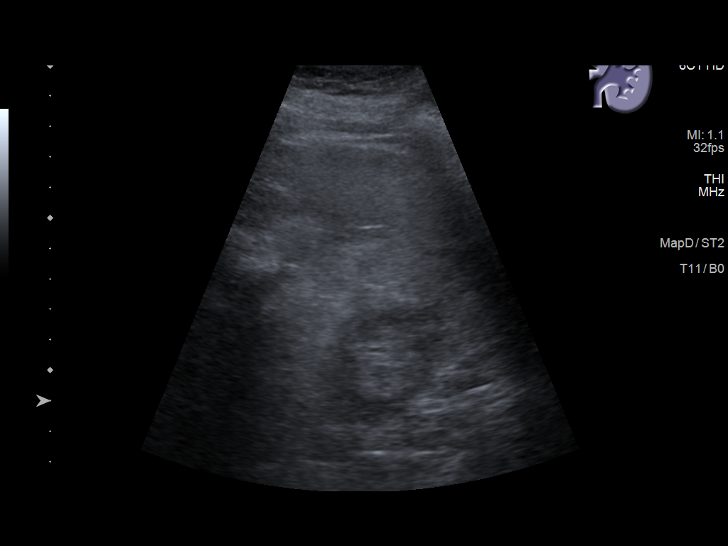
[im 29/39]
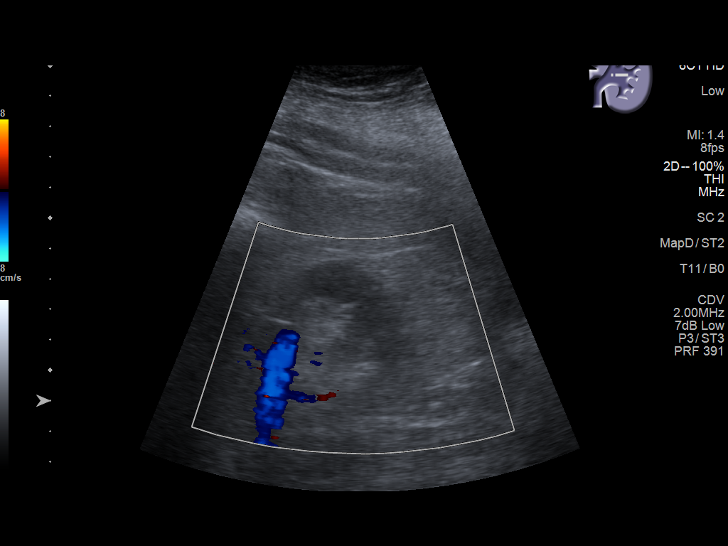
[im 32/39]
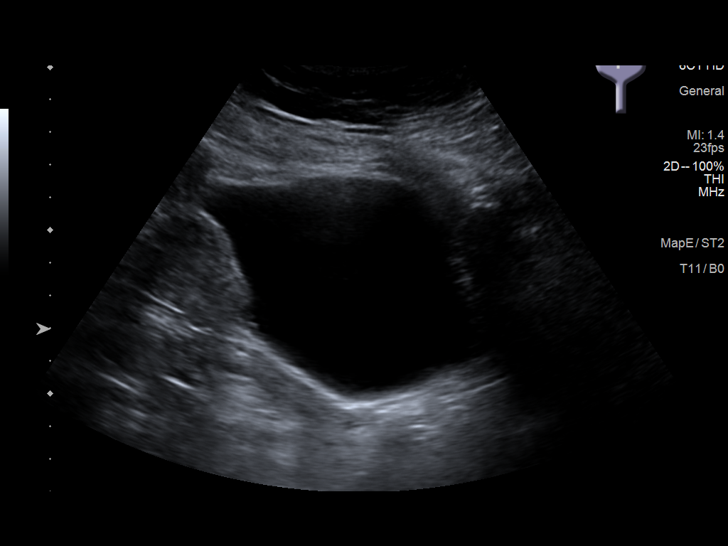
[im 35/39]
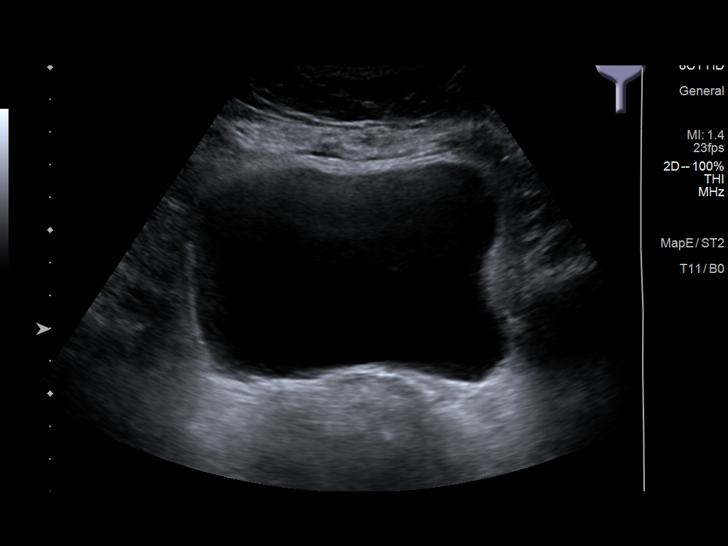
[im 39/39]
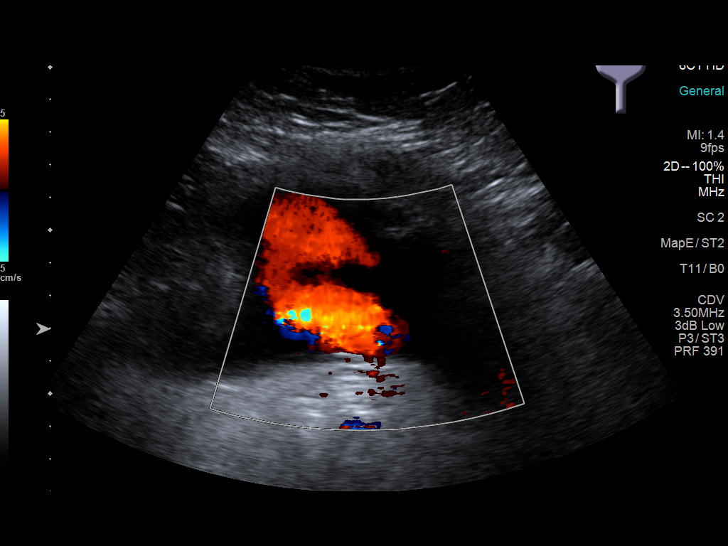

[14 of 25 positions shown; findings below may reference images not displayed]

FINDINGS: Right Kidney:

Renal measurements: 10.3 x 4.6 x 5.2 cm = volume: 129 mL. Prominent
cortical thinning. No mass or hydronephrosis visualized.

Left Kidney:

Renal measurements: 10.7 x 5.3 x 4.4 cm = volume: 129 mL. Mild
cortical thinning. No mass or hydronephrosis visualized.

Bladder:

Appears normal for degree of bladder distention. Bilateral ureteral
jets are seen.
IMPRESSION: Bilateral renal cortical thinning, consistent with medical renal
disease.

## 2019-01-28 MED ORDER — PROPRANOLOL HCL 20 MG PO TABS: 10.0000 mg | ORAL_TABLET | Freq: Two times a day (BID) | ORAL | Status: DC

## 2019-01-28 NOTE — Progress Notes (Signed)
Patient: Margaret Payne, Female    DOB: 06/24/1940, 77 y.o.   MRN: [id number] Visit Date: 01/28/2019  Today's Provider: Lelon Huh, MD   Chief Complaint  Patient presents with  . Annual Exam   Subjective:    AWV- 11/22/2018   Complete Physical Margaret Payne is a 78 y.o. female. She feels well. She reports she is not actively exercising due to knee pain . She reports she is sleeping well. Is scheduled to see orthopedics for knee pain. Reports Dr. Juleen China has ordered renal ultrasound for CKD and has follow up with Dr. Nilda Simmer for diabetes in November. No new complaint today.  -----------------------------------------------------------   Review of Systems  Constitutional: Negative.   HENT: Negative.   Eyes: Negative.   Respiratory: Negative.   Cardiovascular: Negative.   Gastrointestinal: Negative.   Endocrine: Negative.   Genitourinary: Negative.   Musculoskeletal: Positive for arthralgias.  Skin: Negative.   Allergic/Immunologic: Negative.   Neurological: Negative.   Hematological: Negative.   Psychiatric/Behavioral: Negative.     Social History   Socioeconomic History  . Marital status: Single    Spouse name: Not on file  . Number of children: 1  . Years of education: Not on file  . Highest education level: Associate degree: occupational, Hotel manager, or vocational program  Occupational History  . Occupation: Retired  Scientific laboratory technician  . Financial resource strain: Not hard at all  . Food insecurity    Worry: Never true    Inability: Never true  . Transportation needs    Medical: No    Non-medical: No  Tobacco Use  . Smoking status: Never Smoker  . Smokeless tobacco: Never Used  Substance and Sexual Activity  . Alcohol use: No    Alcohol/week: 0.0 standard drinks  . Drug use: No  . Sexual activity: Not on file  Lifestyle  . Physical activity    Days per week: 0 days    Minutes per session: 0 min  . Stress: Not at all  Relationships  . Social  Herbalist on phone: Patient refused    Gets together: Patient refused    Attends religious service: Patient refused    Active member of club or organization: Patient refused    Attends meetings of clubs or organizations: Patient refused    Relationship status: Patient refused  . Intimate partner violence    Fear of current or ex partner: Patient refused    Emotionally abused: Patient refused    Physically abused: Patient refused    Forced sexual activity: Patient refused  Other Topics Concern  . Not on file  Social History Narrative  . Not on file    Past Medical History:  Diagnosis Date  . Arthritis   . Diabetes mellitus without complication ([place])    type 2  . Headache    migrains  . HOH (hard of hearing)   . Hyperlipidemia   . Hypertension      Patient Active Problem List   Diagnosis Date Noted  . Chronic kidney disease, stage IV (severe) ([place]) 05/14/2018  . Osteopenia 11/21/2015  . Vitamin D deficiency 10/11/2015  . Allergic rhinitis 10/10/2015  . Type 2 diabetes mellitus with diabetic nephropathy, with long-term current use of insulin ([place]) 10/10/2015  . Hypertension 10/10/2015  . History of migraine headaches 10/10/2015  . Obesity 10/10/2015    Past Surgical History:  Procedure Laterality Date  . ABDOMINAL HYSTERECTOMY    . BREAST BIOPSY    .  CATARACT EXTRACTION W/PHACO Right 10/03/2014   Procedure: CATARACT EXTRACTION PHACO AND INTRAOCULAR LENS PLACEMENT (IOC);  Surgeon: Birder Robson, MD;  Location: [facility] ORS;  Service: Ophthalmology;  Laterality: Right;  [place] 00:57 AP% 16.7 CDE 9.52 Fluid pack lot#[id number]  . INJECTION KNEE      Her family history includes Breast cancer in her mother; Breast cancer (age of onset: 68) in her sister; Emphysema in her father.   Current Outpatient Medications:  .  Cholecalciferol (VITAMIN D3) 5000 units TABS, Take 5,000 Units by mouth daily., Disp: , Rfl:  .  glipiZIDE (GLUCOTROL XL) 10 MG 24 hr tablet, Take 10  mg by mouth 2 (two) times daily. , Disp: , Rfl:  .  insulin aspart protamine- aspart (NOVOLOG MIX 70/30) (70-30) 100 UNIT/ML injection, Inject into the skin 2 (two) times daily with a meal. , Disp: , Rfl:  .  lisinopril (PRINIVIL,ZESTRIL) 40 MG tablet, Take 40 mg by mouth daily., Disp: , Rfl:  .  Multiple Vitamin (MULTI-VITAMINS) TABS, Take by mouth. One a day vitamin- womens health, Disp: , Rfl:  .  propranolol (INDERAL) 20 MG tablet, Take 20 mg by mouth 2 (two) times daily., Disp: , Rfl:  .  pravastatin (PRAVACHOL) 80 MG tablet, Take 40 mg by mouth daily. , Disp: , Rfl:   Patient Care Team: Birdie Sons, MD as PCP - General (Family Medicine) Gabriel Carina Betsey Holiday, MD as Physician Assistant (Internal Medicine) Birder Robson, MD as Referring Physician (Ophthalmology) Lovell Sheehan, MD as Consulting Physician (Orthopedic Surgery)     Objective:    Vitals: BP 111/70   Pulse (!) 56   Temp (!) 96.9 F (36.1 C) (Oral)   Resp 16   Wt 190 lb 6.4 oz (86.4 kg)   BMI 35.98 kg/m   Physical Exam   General Appearance:    Alert, cooperative, no distress, appears stated age, moderately obese  Head:    Normocephalic, without obvious abnormality, atraumatic  Eyes:    PERRL, conjunctiva/corneas clear, EOM's intact, fundi    benign, both eyes  Ears:    Normal TM's and external ear canals, both ears  Nose:   Nares normal, septum midline, mucosa normal, no drainage    or sinus tenderness  Throat:   Lips, mucosa, and tongue normal; teeth and gums normal  Neck:   Supple, symmetrical, trachea midline, no adenopathy;    thyroid:  no enlargement/tenderness/nodules; no carotid   bruit or JVD  Back:     Symmetric, no curvature, ROM normal, no CVA tenderness  Lungs:     Clear to auscultation bilaterally, respirations unlabored  Chest Wall:    No tenderness or deformity   Heart:    Bradycardic. Normal rhythm. No murmurs, rubs, or gallops.   Breast Exam:    normal appearance, no masses or tenderness   Abdomen:     Soft, non-tender, bowel sounds active all four quadrants,    no masses, no organomegaly  Pelvic:    deferred and not indicated; status post hysterectomy, negative ROS  Extremities:   All extremities are intact. No cyanosis or edema  Pulses:   2+ and symmetric all extremities  Skin:   Skin color, texture, turgor normal, no rashes or lesions  Lymph nodes:   Cervical, supraclavicular, and axillary nodes normal  Neurologic:   CNII-XII intact, normal strength, sensation and reflexes    throughout   Activities of Daily Living In your present state of health, do you have any difficulty performing the following  activities: 11/22/2018  Hearing? Y  Comment Wears a hearing aid in the left ear.  Vision? Y  Comment Due to a cataract on the left eye.  Difficulty concentrating or making decisions? N  Walking or climbing stairs? N  Dressing or bathing? N  Doing errands, shopping? N  Preparing Food and eating ? N  Using the Toilet? N  In the past six months, have you accidently leaked urine? N  Do you have problems with loss of bowel control? N  Managing your Medications? N  Managing your Finances? N  Housekeeping or managing your Housekeeping? N  Some recent data might be hidden    Fall Risk Assessment Fall Risk  11/22/2018 11/10/2017 11/07/2016 10/11/2015  Falls in the past year? 0 No No No     Depression Screen PHQ 2/9 Scores 11/22/2018 11/10/2017 11/10/2017 11/07/2016  PHQ - 2 Score 0 0 0 0  PHQ- 9 Score - 2 - 2    6CIT Screen 11/07/2016  What Year? 0 points  What month? 0 points  What time? 0 points  Count back from 20 0 points  Months in reverse 0 points  Repeat phrase 0 points  Total Score 0       Assessment & Plan:    Annual Physical Reviewed patient's Family Medical History Reviewed and updated list of patient's medical providers Assessment of cognitive impairment was done Assessed patient's functional ability Established a written schedule for health screening  [place] Completed and Reviewed  Exercise Activities and Dietary recommendations Goals    . Exercise 3x per week (30 min per time)     Recommend to exercise for 3 days a week for at least 30 minutes at a time.        Immunization History  Administered Date(s) Administered  . Influenza, High Dose Seasonal PF 02/23/2016, 02/15/2018  . Influenza,inj,Quad PF,6+ Mos 01/23/2017  . Pneumococcal Conjugate-13 10/11/2015  . Pneumococcal Polysaccharide-23 11/10/2017  . Zoster 04/17/2008    Health Maintenance  Topic Date Due  . DEXA SCAN  11/14/2018  . INFLUENZA VACCINE  11/20/2018  . TETANUS/TDAP  10/20/2026 (Originally 10/19/1960)  . FOOT EXAM  04/29/2019  . HEMOGLOBIN A1C  05/20/2019  . OPHTHALMOLOGY EXAM  09/01/2019  . PNA vac Low Risk Adult  Completed     Discussed health benefits of physical activity, and encouraged her to engage in regular exercise appropriate for her age and condition.    ------------------------------------------------------------------------------------------------------------  1. Annual physical exam Unremarkable exam aside from obesity. Continue to work on losing weight with diabetic diet and prudent exercise.  - EKG 12-Lead  2. History of migraine headaches Has been on propranolol for many years and reports that she has not had a migraine for many years. Suggested she work on weaning and keep close check on her BP, will start by cutting down from 1 tablet to 1/2 tablet twice daily.   3. Type 2 diabetes mellitus with diabetic nephropathy, with long-term current use of insulin (HCC) Stable, continue follow up Dr. Nilda Simmer   4. Vitamin D deficiency Followed by nephrology  5. Osteopenia, unspecified location Due for BMD - DG Bone Density; Future  6. Class 1 obesity with serious comorbidity and body mass index (BMI) of 34.0 to 34.9 in adult, unspecified obesity type   7. Essential hypertension Well controlled.  Continue current  medications.    8. Chronic kidney disease, stage IV (severe) ([place]) Continue routine follow up nephrology.   9. Need for influenza vaccination  -  Flu Vaccine QUAD High Dose(Fluad)  Other orders - propranolol (INDERAL) 20 MG tablet; Take 0.5 tablets (10 mg total) by mouth 2 (two) times daily.   Lelon Huh, MD  [facility]

## 2019-01-28 NOTE — Patient Instructions (Addendum)
.   Please review the attached list of medications and notify my office if there are any errors.   . Please bring all of your medications to every appointment so we can make sure that our medication list is the same as yours.   The CDC recommends two doses of Shingrix (the shingles vaccine) separated by 2 to 6 months for adults age 77 years and older. I recommend checking with your insurance plan regarding coverage for this vaccine.    Cut back on propranolol to 10 mg (1/2 tablet) twice daily

## 2019-01-31 DIAGNOSIS — E119 Type 2 diabetes mellitus without complications: Secondary | ICD-10-CM | POA: Diagnosis not present

## 2019-01-31 DIAGNOSIS — Z794 Long term (current) use of insulin: Secondary | ICD-10-CM | POA: Diagnosis not present

## 2019-01-31 DIAGNOSIS — I10 Essential (primary) hypertension: Secondary | ICD-10-CM | POA: Diagnosis not present

## 2019-01-31 DIAGNOSIS — R809 Proteinuria, unspecified: Secondary | ICD-10-CM | POA: Diagnosis not present

## 2019-01-31 DIAGNOSIS — N184 Chronic kidney disease, stage 4 (severe): Secondary | ICD-10-CM | POA: Diagnosis not present

## 2019-02-14 ENCOUNTER — Other Ambulatory Visit: Payer: PPO

## 2019-02-21 DIAGNOSIS — Z794 Long term (current) use of insulin: Secondary | ICD-10-CM | POA: Diagnosis not present

## 2019-02-21 DIAGNOSIS — N184 Chronic kidney disease, stage 4 (severe): Secondary | ICD-10-CM | POA: Diagnosis not present

## 2019-02-21 DIAGNOSIS — E1122 Type 2 diabetes mellitus with diabetic chronic kidney disease: Secondary | ICD-10-CM | POA: Diagnosis not present

## 2019-02-24 ENCOUNTER — Ambulatory Visit
Admission: RE | Admit: 2019-02-24 | Discharge: 2019-02-24 | Disposition: A | Discharge status: Home / Self Care | Payer: PPO | Source: Ambulatory Visit | Attending: Family Medicine | Admitting: Family Medicine

## 2019-02-24 DIAGNOSIS — M8580 Other specified disorders of bone density and structure, unspecified site: Secondary | ICD-10-CM | POA: Diagnosis not present

## 2019-02-24 DIAGNOSIS — M8588 Other specified disorders of bone density and structure, other site: Secondary | ICD-10-CM | POA: Diagnosis not present

## 2019-02-24 DIAGNOSIS — Z780 Asymptomatic menopausal state: Secondary | ICD-10-CM | POA: Diagnosis not present

## 2019-02-24 DIAGNOSIS — Z13820 Encounter for screening for osteoporosis: Secondary | ICD-10-CM | POA: Insufficient documentation

## 2019-02-28 DIAGNOSIS — E1122 Type 2 diabetes mellitus with diabetic chronic kidney disease: Secondary | ICD-10-CM | POA: Diagnosis not present

## 2019-02-28 DIAGNOSIS — Z794 Long term (current) use of insulin: Secondary | ICD-10-CM | POA: Diagnosis not present

## 2019-02-28 DIAGNOSIS — E11319 Type 2 diabetes mellitus with unspecified diabetic retinopathy without macular edema: Secondary | ICD-10-CM | POA: Diagnosis not present

## 2019-02-28 DIAGNOSIS — N184 Chronic kidney disease, stage 4 (severe): Secondary | ICD-10-CM | POA: Diagnosis not present

## 2019-02-28 DIAGNOSIS — E1121 Type 2 diabetes mellitus with diabetic nephropathy: Secondary | ICD-10-CM | POA: Diagnosis not present

## 2019-02-28 DIAGNOSIS — E1129 Type 2 diabetes mellitus with other diabetic kidney complication: Secondary | ICD-10-CM | POA: Diagnosis not present

## 2019-02-28 DIAGNOSIS — R809 Proteinuria, unspecified: Secondary | ICD-10-CM | POA: Diagnosis not present

## 2019-03-31 DIAGNOSIS — M1712 Unilateral primary osteoarthritis, left knee: Secondary | ICD-10-CM | POA: Diagnosis not present

## 2019-04-27 DIAGNOSIS — D631 Anemia in chronic kidney disease: Secondary | ICD-10-CM | POA: Insufficient documentation

## 2019-04-27 DIAGNOSIS — E1122 Type 2 diabetes mellitus with diabetic chronic kidney disease: Secondary | ICD-10-CM | POA: Diagnosis not present

## 2019-04-27 DIAGNOSIS — N2581 Secondary hyperparathyroidism of renal origin: Secondary | ICD-10-CM | POA: Insufficient documentation

## 2019-04-27 DIAGNOSIS — I129 Hypertensive chronic kidney disease with stage 1 through stage 4 chronic kidney disease, or unspecified chronic kidney disease: Secondary | ICD-10-CM | POA: Insufficient documentation

## 2019-04-27 DIAGNOSIS — R809 Proteinuria, unspecified: Secondary | ICD-10-CM | POA: Diagnosis not present

## 2019-04-27 DIAGNOSIS — N189 Chronic kidney disease, unspecified: Secondary | ICD-10-CM | POA: Insufficient documentation

## 2019-04-27 DIAGNOSIS — N184 Chronic kidney disease, stage 4 (severe): Secondary | ICD-10-CM | POA: Diagnosis not present

## 2019-05-31 DIAGNOSIS — E1129 Type 2 diabetes mellitus with other diabetic kidney complication: Secondary | ICD-10-CM | POA: Diagnosis not present

## 2019-05-31 DIAGNOSIS — R809 Proteinuria, unspecified: Secondary | ICD-10-CM | POA: Diagnosis not present

## 2019-05-31 DIAGNOSIS — Z794 Long term (current) use of insulin: Secondary | ICD-10-CM | POA: Diagnosis not present

## 2019-06-07 DIAGNOSIS — E11319 Type 2 diabetes mellitus with unspecified diabetic retinopathy without macular edema: Secondary | ICD-10-CM | POA: Diagnosis not present

## 2019-06-07 DIAGNOSIS — Z794 Long term (current) use of insulin: Secondary | ICD-10-CM | POA: Diagnosis not present

## 2019-06-07 DIAGNOSIS — E1165 Type 2 diabetes mellitus with hyperglycemia: Secondary | ICD-10-CM | POA: Diagnosis not present

## 2019-06-07 DIAGNOSIS — E1129 Type 2 diabetes mellitus with other diabetic kidney complication: Secondary | ICD-10-CM | POA: Diagnosis not present

## 2019-06-07 DIAGNOSIS — R809 Proteinuria, unspecified: Secondary | ICD-10-CM | POA: Diagnosis not present

## 2019-06-07 DIAGNOSIS — N184 Chronic kidney disease, stage 4 (severe): Secondary | ICD-10-CM | POA: Diagnosis not present

## 2019-06-07 DIAGNOSIS — E1122 Type 2 diabetes mellitus with diabetic chronic kidney disease: Secondary | ICD-10-CM | POA: Diagnosis not present

## 2019-06-07 DIAGNOSIS — E782 Mixed hyperlipidemia: Secondary | ICD-10-CM | POA: Diagnosis not present

## 2019-06-07 DIAGNOSIS — E1121 Type 2 diabetes mellitus with diabetic nephropathy: Secondary | ICD-10-CM | POA: Diagnosis not present

## 2019-07-07 DIAGNOSIS — M1712 Unilateral primary osteoarthritis, left knee: Secondary | ICD-10-CM | POA: Insufficient documentation

## 2019-08-15 DIAGNOSIS — M1712 Unilateral primary osteoarthritis, left knee: Secondary | ICD-10-CM | POA: Diagnosis not present

## 2019-08-24 DIAGNOSIS — N2581 Secondary hyperparathyroidism of renal origin: Secondary | ICD-10-CM | POA: Diagnosis not present

## 2019-08-24 DIAGNOSIS — E1122 Type 2 diabetes mellitus with diabetic chronic kidney disease: Secondary | ICD-10-CM | POA: Diagnosis not present

## 2019-08-24 DIAGNOSIS — I129 Hypertensive chronic kidney disease with stage 1 through stage 4 chronic kidney disease, or unspecified chronic kidney disease: Secondary | ICD-10-CM | POA: Diagnosis not present

## 2019-08-24 DIAGNOSIS — N184 Chronic kidney disease, stage 4 (severe): Secondary | ICD-10-CM | POA: Diagnosis not present

## 2019-08-24 DIAGNOSIS — D631 Anemia in chronic kidney disease: Secondary | ICD-10-CM | POA: Diagnosis not present

## 2019-08-24 DIAGNOSIS — R809 Proteinuria, unspecified: Secondary | ICD-10-CM | POA: Diagnosis not present

## 2019-09-07 DIAGNOSIS — H2512 Age-related nuclear cataract, left eye: Secondary | ICD-10-CM | POA: Diagnosis not present

## 2019-09-07 LAB — HM DIABETES EYE EXAM

## 2019-09-20 DIAGNOSIS — R809 Proteinuria, unspecified: Secondary | ICD-10-CM | POA: Diagnosis not present

## 2019-09-20 DIAGNOSIS — E1121 Type 2 diabetes mellitus with diabetic nephropathy: Secondary | ICD-10-CM | POA: Diagnosis not present

## 2019-09-20 DIAGNOSIS — Z794 Long term (current) use of insulin: Secondary | ICD-10-CM | POA: Diagnosis not present

## 2019-09-20 DIAGNOSIS — N184 Chronic kidney disease, stage 4 (severe): Secondary | ICD-10-CM | POA: Diagnosis not present

## 2019-09-20 DIAGNOSIS — E11319 Type 2 diabetes mellitus with unspecified diabetic retinopathy without macular edema: Secondary | ICD-10-CM | POA: Diagnosis not present

## 2019-09-20 DIAGNOSIS — E1165 Type 2 diabetes mellitus with hyperglycemia: Secondary | ICD-10-CM | POA: Diagnosis not present

## 2019-09-20 DIAGNOSIS — E1129 Type 2 diabetes mellitus with other diabetic kidney complication: Secondary | ICD-10-CM | POA: Diagnosis not present

## 2019-09-20 DIAGNOSIS — E1122 Type 2 diabetes mellitus with diabetic chronic kidney disease: Secondary | ICD-10-CM | POA: Diagnosis not present

## 2019-11-28 NOTE — Progress Notes (Signed)
Subjective:   Margaret Payne is a 79 y.o. female who presents for Medicare Annual (Subsequent) preventive examination.  I connected with Margaret Payne today by telephone and verified that I am speaking with the correct person using two identifiers. Location patient: home Location provider: work Persons participating in the virtual visit: patient, provider.   I discussed the limitations, risks, security and privacy concerns of performing an evaluation and management service by telephone and the availability of in person appointments. I also discussed with the patient that there may be a patient responsible charge related to this service. The patient expressed understanding and verbally consented to this telephonic visit.    Interactive audio and video telecommunications were attempted between this provider and patient, however failed, due to patient having technical difficulties OR patient did not have access to video capability.  We continued and completed visit with audio only.   Review of Systems    N/A  Cardiac Risk Factors include: advanced age (>9men, >25 women);diabetes mellitus;obesity (BMI >30kg/m2);hypertension     Objective:    There were no vitals filed for this visit. There is no height or weight on file to calculate BMI.  Advanced Directives 11/29/2019 11/22/2018 11/10/2017 11/07/2016  Does Patient Have a Medical Advance Directive? Yes Yes Yes Yes  Type of Paramedic of [place];Living will [place];Living will [place];Living will [place];Living will  Copy of [place] in Chart? No - copy requested No - copy requested No - copy requested No - copy requested    Current Medications (verified) Outpatient Encounter Medications as of 11/29/2019  Medication Sig  . Cholecalciferol (VITAMIN D3) 5000 units TABS Take 5,000 Units by mouth daily.  . Dulaglutide (TRULICITY) 1.5  ZO/1.0RU SOPN Inject into the skin once a week.   . insulin lispro protamine-lispro (HUMALOG 75/25 MIX) (75-25) 100 UNIT/ML SUSP injection Inject into the skin 2 (two) times daily with a meal. 40 units in AM and 20 units in PM  . lisinopril (PRINIVIL,ZESTRIL) 40 MG tablet Take 40 mg by mouth daily.  . Multiple Vitamin (MULTI-VITAMINS) TABS Take by mouth. One a day vitamin- womens health  . glipiZIDE (GLUCOTROL XL) 10 MG 24 hr tablet Take 10 mg by mouth 2 (two) times daily.  (Patient not taking: Reported on 11/29/2019)  . insulin aspart protamine- aspart (NOVOLOG MIX 70/30) (70-30) 100 UNIT/ML injection Inject into the skin 2 (two) times daily with a meal.  (Patient not taking: Reported on 11/29/2019)  . pravastatin (PRAVACHOL) 80 MG tablet Take 40 mg by mouth daily.  (Patient not taking: Reported on 11/29/2019)  . propranolol (INDERAL) 20 MG tablet Take 0.5 tablets (10 mg total) by mouth 2 (two) times daily. (Patient not taking: Reported on 11/29/2019)   No facility-administered encounter medications on file as of 11/29/2019.    Allergies (verified) Codeine sulfate   History: Past Medical History:  Diagnosis Date  . Arthritis   . Diabetes mellitus without complication ([place])    type 2  . Headache    migrains  . HOH (hard of hearing)   . Hyperlipidemia   . Hypertension    Past Surgical History:  Procedure Laterality Date  . ABDOMINAL HYSTERECTOMY    . BREAST BIOPSY    . CATARACT EXTRACTION W/PHACO Right 10/03/2014   Procedure: CATARACT EXTRACTION PHACO AND INTRAOCULAR LENS PLACEMENT (IOC);  Surgeon: Birder Robson, MD;  Location: [facility] ORS;  Service: Ophthalmology;  Laterality: Right;  [place]  00:57 AP% 16.7 CDE 9.52 Fluid pack lot#[id number]  . INJECTION KNEE     Family History  Problem Relation Age of Onset  . Breast cancer Mother   . Emphysema Father   . Breast cancer Sister 35   Social History   Socioeconomic History  . Marital status: Divorced    Spouse name: Not on file  .  Number of children: 1  . Years of education: Not on file  . Highest education level: Associate degree: occupational, Hotel manager, or vocational program  Occupational History  . Occupation: Retired  Tobacco Use  . Smoking status: Never Smoker  . Smokeless tobacco: Never Used  Vaping Use  . Vaping Use: Never used  Substance and Sexual Activity  . Alcohol use: No    Alcohol/week: 0.0 standard drinks  . Drug use: No  . Sexual activity: Not on file  Other Topics Concern  . Not on file  Social History Narrative  . Not on file   Social Determinants of Health   Financial Resource Strain: Low Risk   . Difficulty of Paying Living Expenses: Not hard at all  Food Insecurity: No Food Insecurity  . Worried About Charity fundraiser in the Last Year: Never true  . Ran Out of Food in the Last Year: Never true  Transportation Needs: No Transportation Needs  . Lack of Transportation (Medical): No  . Lack of Transportation (Non-Medical): No  Physical Activity: Inactive  . Days of Exercise per Week: 0 days  . Minutes of Exercise per Session: 0 min  Stress: No Stress Concern Present  . Feeling of Stress : Not at all  Social Connections: Moderately Isolated  . Frequency of Communication with Friends and Family: More than three times a week  . Frequency of Social Gatherings with Friends and Family: More than three times a week  . Attends Religious Services: Never  . Active Member of Clubs or Organizations: Yes  . Attends Archivist Meetings: More than 4 times per year  . Marital Status: Divorced    Tobacco Counseling Counseling given: Not Answered   Clinical Intake:  Pre-visit preparation completed: Yes  Pain : No/denies pain     Nutritional Risks: None Diabetes: Yes  How often do you need to have someone help you when you read instructions, pamphlets, or other written materials from your doctor or pharmacy?: 1 - Never  Diabetic? Yes  Nutrition Risk Assessment:  Has  the patient had any N/V/D within the last 2 months?  No  Does the patient have any non-healing wounds?  No  Has the patient had any unintentional weight loss or weight gain?  No   Diabetes:  Is the patient diabetic?  Yes  If diabetic, was a CBG obtained today?  No  Did the patient bring in their glucometer from home?  No  How often do you monitor your CBG's? Two to three times a day.   Financial Strains and Diabetes Management:  Are you having any financial strains with the device, your supplies or your medication? No .  Does the patient want to be seen by Chronic Care Management for management of their diabetes?  No  Would the patient like to be referred to a Nutritionist or for Diabetic Management?  No   Diabetic Exams:  Diabetic Eye Exam: Completed 09/07/19 Diabetic Foot Exam: Completed 09/20/19  Interpreter Needed?: No  Information entered by :: [facility], LPN   Activities of Daily Living In your present state of health,  do you have any difficulty performing the following activities: 11/29/2019  Hearing? Y  Comment Wears a hearing aid in the left ear.  Vision? Y  Comment Has a cataract on the left eye.  Difficulty concentrating or making decisions? N  Walking or climbing stairs? N  Dressing or bathing? N  Doing errands, shopping? N  Preparing Food and eating ? N  Using the Toilet? N  In the past six months, have you accidently leaked urine? N  Do you have problems with loss of bowel control? N  Managing your Medications? N  Managing your Finances? N  Housekeeping or managing your Housekeeping? Y  Comment Does not do yard work.  Some recent data might be hidden    Patient Care Team: Birdie Sons, MD as PCP - General (Family Medicine) Gabriel Carina, Betsey Holiday, MD as Physician Assistant (Internal Medicine) Birder Robson, MD as Referring Physician (Ophthalmology) Lovell Sheehan, MD as Consulting Physician (Orthopedic Surgery) Lavonia Dana, MD as Consulting Physician  (Internal Medicine)  Indicate any recent Medical Services you may have received from other than [facility] providers in the past year (date may be approximate).     Assessment:   This is a routine wellness examination for Jolyne.  Hearing/Vision screen No exam data present  Dietary issues and exercise activities discussed: Current Exercise Habits: The patient does not participate in regular exercise at present, Exercise limited by: None identified  Goals    . Exercise 3x per week (30 min per time)     Recommend to exercise for 3 days a week for at least 30 minutes at a time.       Depression Screen PHQ 2/9 Scores 11/29/2019 11/22/2018 11/10/2017 11/10/2017 11/07/2016 11/07/2016 10/11/2015  PHQ - 2 Score 0 0 0 0 0 0 0  PHQ- 9 Score - - 2 - 2 - 0    Fall Risk Fall Risk  11/29/2019 11/22/2018 11/10/2017 11/07/2016 10/11/2015  Falls in the past year? 0 0 No No No  Number falls in past yr: 0 - - - -  Injury with Fall? 0 - - - -    Any stairs in or around the home? Yes  If so, are there any without handrails? No  Home free of loose throw rugs in walkways, pet beds, electrical cords, etc? Yes  Adequate lighting in your home to reduce risk of falls? Yes   ASSISTIVE DEVICES UTILIZED TO PREVENT FALLS:  Life alert? No  Use of a cane, walker or w/c? Yes  Grab bars in the bathroom? Yes  Shower chair or bench in shower? No  Elevated toilet seat or a handicapped toilet? No    Cognitive Function:     6CIT Screen 11/07/2016  What Year? 0 points  What month? 0 points  What time? 0 points  Count back from 20 0 points  Months in reverse 0 points  Repeat phrase 0 points  Total Score 0    Immunizations Immunization History  Administered Date(s) Administered  . Fluad Quad(high Dose 65+) 01/28/2019  . Influenza, High Dose Seasonal PF 02/23/2016, 02/15/2018  . Influenza,inj,Quad PF,6+ Mos 01/23/2017  . Moderna SARS-COVID-2 Vaccination 06/18/2019, 07/16/2019  . Pneumococcal Conjugate-13 10/11/2015   . Pneumococcal Polysaccharide-23 11/10/2017  . Zoster 04/17/2008    TDAP status: Due, Education has been provided regarding the importance of this vaccine. Advised may receive this vaccine at local pharmacy or Health Dept. Aware to provide a copy of the vaccination record if obtained from local pharmacy or  Health Dept. Verbalized acceptance and understanding. Flu Vaccine status: Up to date Pneumococcal vaccine status: Up to date Covid-19 vaccine status: Completed vaccines  Qualifies for Shingles Vaccine? Yes   Zostavax completed Yes   Shingrix Completed?: No.    Education has been provided regarding the importance of this vaccine. Patient has been advised to call insurance company to determine out of pocket expense if they have not yet received this vaccine. Advised may also receive vaccine at local pharmacy or Health Dept. Verbalized acceptance and understanding.  Screening Tests Health Maintenance  Topic Date Due  . INFLUENZA VACCINE  11/20/2019  . TETANUS/TDAP  10/20/2026 (Originally 10/19/1960)  . HEMOGLOBIN A1C  03/21/2020  . OPHTHALMOLOGY EXAM  09/06/2020  . FOOT EXAM  09/19/2020  . DEXA SCAN  02/23/2022  . COVID-19 Vaccine  Completed  . PNA vac Low Risk Adult  Completed    Health Maintenance  Health Maintenance Due  Topic Date Due  . INFLUENZA VACCINE  11/20/2019    Colorectal cancer screening: No longer required.  Mammogram status: No longer required.  Bone Density status: Completed 02/24/19. Results reflect: Bone density results: OSTEOPENIA. Repeat every 3 years.  Lung Cancer Screening: (Low Dose CT Chest recommended if Age 81-80 years, 30 pack-year currently smoking OR have quit w/in 15years.) does not qualify.    Additional Screening:  Vision Screening: Recommended annual ophthalmology exams for early detection of glaucoma and other disorders of the eye. Is the patient up to date with their annual eye exam?  Yes  Who is the provider or what is the name of the  office in which the patient attends annual eye exams? Dr George Ina If pt is not established with a provider, would they like to be referred to a provider to establish care? No .   Dental Screening: Recommended annual dental exams for proper oral hygiene  Community Resource Referral / Chronic Care Management: CRR required this visit?  No   CCM required this visit?  No      Plan:     I have personally reviewed and noted the following in the patient's chart:   . Medical and social history . Use of alcohol, tobacco or illicit drugs  . Current medications and supplements . Functional ability and status . Nutritional status . Physical activity . Advanced directives . List of other physicians . Hospitalizations, surgeries, and ER visits in previous 12 months . Vitals . Screenings to include cognitive, depression, and falls . Referrals and appointments  In addition, I have reviewed and discussed with patient certain preventive protocols, quality metrics, and best practice recommendations. A written personalized care plan for preventive services as well as general preventive health recommendations were provided to patient.     Eddy Termine [place], [place]   1/54/0086   Nurse Notes: None.

## 2019-11-29 ENCOUNTER — Other Ambulatory Visit: Payer: Self-pay

## 2019-11-29 ENCOUNTER — Ambulatory Visit (INDEPENDENT_AMBULATORY_CARE_PROVIDER_SITE_OTHER): Payer: PPO

## 2019-11-29 DIAGNOSIS — Z0000 Encounter for general adult medical examination without abnormal findings: Secondary | ICD-10-CM | POA: Diagnosis not present

## 2019-11-29 NOTE — Patient Instructions (Signed)
[name] , Thank you for taking time to come for your Medicare Wellness Visit. I appreciate your ongoing commitment to your health goals. Please review the following plan we discussed and let me know if I can assist you in the future.   Screening recommendations/referrals: Colonoscopy: No longer required.  Mammogram: No longer required.  Bone Density: Up to date, due 02/2022 Recommended yearly ophthalmology/optometry visit for glaucoma screening and checkup Recommended yearly dental visit for hygiene and checkup  Vaccinations: Influenza vaccine: Done 01/28/19 Pneumococcal vaccine: Completed series Tdap vaccine: Currently due, declined at this time time. Shingles vaccine: Shingrix discussed. Please contact your pharmacy for coverage information.     Advanced directives: Please bring a copy of your POA (Power of Attorney) and/or Living Will to your next appointment.   Conditions/risks identified: Continue to increase fruit and vegetable consumption to 2 servings of each a day.   Next appointment: 03/13/20 @ 9:40 AM with Dr Caryn Section    Preventive Care 45 Years and Older, Female Preventive care refers to lifestyle choices and visits with your health care provider that can promote health and wellness. What does preventive care include?  A yearly physical exam. This is also called an annual well check.  Dental exams once or twice a year.  Routine eye exams. Ask your health care provider how often you should have your eyes checked.  Personal lifestyle choices, including:  Daily care of your teeth and gums.  Regular physical activity.  Eating a healthy diet.  Avoiding tobacco and drug use.  Limiting alcohol use.  Practicing safe sex.  Taking low-dose aspirin every day.  Taking vitamin and mineral supplements as recommended by your health care provider. What happens during an annual well check? The services and screenings done by your health care provider during your annual  well check will depend on your age, overall health, lifestyle risk factors, and family history of disease. Counseling  Your health care provider may ask you questions about your:  Alcohol use.  Tobacco use.  Drug use.  Emotional well-being.  Home and relationship well-being.  Sexual activity.  Eating habits.  History of falls.  Memory and ability to understand (cognition).  Work and work Statistician.  Reproductive health. Screening  You may have the following tests or measurements:  Height, weight, and BMI.  Blood pressure.  Lipid and cholesterol levels. These may be checked every 5 years, or more frequently if you are over 48 years old.  Skin check.  Lung cancer screening. You may have this screening every year starting at age 42 if you have a 30-pack-year history of smoking and currently smoke or have quit within the past 15 years.  Fecal occult blood test (FOBT) of the stool. You may have this test every year starting at age 71.  Flexible sigmoidoscopy or colonoscopy. You may have a sigmoidoscopy every 5 years or a colonoscopy every 10 years starting at age 65.  Hepatitis C blood test.  Hepatitis B blood test.  Sexually transmitted disease (STD) testing.  Diabetes screening. This is done by checking your blood sugar (glucose) after you have not eaten for a while (fasting). You may have this done every 1-3 years.  Bone density scan. This is done to screen for osteoporosis. You may have this done starting at age 48.  Mammogram. This may be done every 1-2 years. Talk to your health care provider about how often you should have regular mammograms. Talk with your health care provider about your test results,  treatment options, and if necessary, the need for more tests. Vaccines  Your health care provider may recommend certain vaccines, such as:  Influenza vaccine. This is recommended every year.  Tetanus, diphtheria, and acellular pertussis (Tdap, Td) vaccine.  You may need a Td booster every 10 years.  Zoster vaccine. You may need this after age 50.  Pneumococcal 13-valent conjugate (PCV13) vaccine. One dose is recommended after age 67.  Pneumococcal polysaccharide (PPSV23) vaccine. One dose is recommended after age 84. Talk to your health care provider about which screenings and vaccines you need and how often you need them. This information is not intended to replace advice given to you by your health care provider. Make sure you discuss any questions you have with your health care provider. Document Released: 05/04/2015 Document Revised: 12/26/2015 Document Reviewed: 02/06/2015 Elsevier Interactive Patient Education  2017 [employer] Prevention in the Home Falls can cause injuries. They can happen to people of all ages. There are many things you can do to make your home safe and to help prevent falls. What can I do on the outside of my home?  Regularly fix the edges of walkways and driveways and fix any cracks.  Remove anything that might make you trip as you walk through a door, such as a raised step or threshold.  Trim any bushes or trees on the path to your home.  Use bright outdoor lighting.  Clear any walking paths of anything that might make someone trip, such as rocks or tools.  Regularly check to see if handrails are loose or broken. Make sure that both sides of any steps have handrails.  Any raised decks and porches should have guardrails on the edges.  Have any leaves, snow, or ice cleared regularly.  Use sand or salt on walking paths during winter.  Clean up any spills in your garage right away. This includes oil or grease spills. What can I do in the bathroom?  Use night lights.  Install grab bars by the toilet and in the tub and shower. Do not use towel bars as grab bars.  Use non-skid mats or decals in the tub or shower.  If you need to sit down in the shower, use a plastic, non-slip stool.  Keep the  floor dry. Clean up any water that spills on the floor as soon as it happens.  Remove soap buildup in the tub or shower regularly.  Attach bath mats securely with double-sided non-slip rug tape.  Do not have throw rugs and other things on the floor that can make you trip. What can I do in the bedroom?  Use night lights.  Make sure that you have a light by your bed that is easy to reach.  Do not use any sheets or blankets that are too big for your bed. They should not hang down onto the floor.  Have a firm chair that has side arms. You can use this for support while you get dressed.  Do not have throw rugs and other things on the floor that can make you trip. What can I do in the kitchen?  Clean up any spills right away.  Avoid walking on wet floors.  Keep items that you use a lot in easy-to-reach places.  If you need to reach something above you, use a strong step stool that has a grab bar.  Keep electrical cords out of the way.  Do not use floor polish or wax that makes floors  slippery. If you must use wax, use non-skid floor wax.  Do not have throw rugs and other things on the floor that can make you trip. What can I do with my stairs?  Do not leave any items on the stairs.  Make sure that there are handrails on both sides of the stairs and use them. Fix handrails that are broken or loose. Make sure that handrails are as long as the stairways.  Check any carpeting to make sure that it is firmly attached to the stairs. Fix any carpet that is loose or worn.  Avoid having throw rugs at the top or bottom of the stairs. If you do have throw rugs, attach them to the floor with carpet tape.  Make sure that you have a light switch at the top of the stairs and the bottom of the stairs. If you do not have them, ask someone to add them for you. What else can I do to help prevent falls?  Wear shoes that:  Do not have high heels.  Have rubber bottoms.  Are comfortable and fit  you well.  Are closed at the toe. Do not wear sandals.  If you use a stepladder:  Make sure that it is fully opened. Do not climb a closed stepladder.  Make sure that both sides of the stepladder are locked into place.  Ask someone to hold it for you, if possible.  Clearly mark and make sure that you can see:  Any grab bars or handrails.  First and last steps.  Where the edge of each step is.  Use tools that help you move around (mobility aids) if they are needed. These include:  Canes.  Walkers.  Scooters.  Crutches.  Turn on the lights when you go into a dark area. Replace any light bulbs as soon as they burn out.  Set up your furniture so you have a clear path. Avoid moving your furniture around.  If any of your floors are uneven, fix them.  If there are any pets around you, be aware of where they are.  Review your medicines with your doctor. Some medicines can make you feel dizzy. This can increase your chance of falling. Ask your doctor what other things that you can do to help prevent falls. This information is not intended to replace advice given to you by your health care provider. Make sure you discuss any questions you have with your health care provider. Document Released: 02/01/2009 Document Revised: 09/13/2015 Document Reviewed: 05/12/2014 Elsevier Interactive Patient Education  2017 [employer].

## 2019-11-30 DIAGNOSIS — N184 Chronic kidney disease, stage 4 (severe): Secondary | ICD-10-CM | POA: Diagnosis not present

## 2019-11-30 DIAGNOSIS — R809 Proteinuria, unspecified: Secondary | ICD-10-CM | POA: Diagnosis not present

## 2019-11-30 DIAGNOSIS — E1122 Type 2 diabetes mellitus with diabetic chronic kidney disease: Secondary | ICD-10-CM | POA: Diagnosis not present

## 2019-11-30 DIAGNOSIS — I129 Hypertensive chronic kidney disease with stage 1 through stage 4 chronic kidney disease, or unspecified chronic kidney disease: Secondary | ICD-10-CM | POA: Diagnosis not present

## 2019-11-30 DIAGNOSIS — D631 Anemia in chronic kidney disease: Secondary | ICD-10-CM | POA: Diagnosis not present

## 2019-11-30 DIAGNOSIS — N2581 Secondary hyperparathyroidism of renal origin: Secondary | ICD-10-CM | POA: Diagnosis not present

## 2019-12-05 DIAGNOSIS — E1122 Type 2 diabetes mellitus with diabetic chronic kidney disease: Secondary | ICD-10-CM | POA: Diagnosis not present

## 2019-12-05 DIAGNOSIS — N184 Chronic kidney disease, stage 4 (severe): Secondary | ICD-10-CM | POA: Diagnosis not present

## 2019-12-05 DIAGNOSIS — R809 Proteinuria, unspecified: Secondary | ICD-10-CM | POA: Diagnosis not present

## 2019-12-05 DIAGNOSIS — I10 Essential (primary) hypertension: Secondary | ICD-10-CM | POA: Diagnosis not present

## 2019-12-05 DIAGNOSIS — D631 Anemia in chronic kidney disease: Secondary | ICD-10-CM | POA: Diagnosis not present

## 2019-12-05 DIAGNOSIS — N2581 Secondary hyperparathyroidism of renal origin: Secondary | ICD-10-CM | POA: Diagnosis not present

## 2019-12-05 DIAGNOSIS — I129 Hypertensive chronic kidney disease with stage 1 through stage 4 chronic kidney disease, or unspecified chronic kidney disease: Secondary | ICD-10-CM | POA: Diagnosis not present

## 2019-12-13 DIAGNOSIS — M1712 Unilateral primary osteoarthritis, left knee: Secondary | ICD-10-CM | POA: Diagnosis not present

## 2020-01-03 DIAGNOSIS — R809 Proteinuria, unspecified: Secondary | ICD-10-CM | POA: Diagnosis not present

## 2020-01-03 DIAGNOSIS — E1122 Type 2 diabetes mellitus with diabetic chronic kidney disease: Secondary | ICD-10-CM | POA: Diagnosis not present

## 2020-01-03 DIAGNOSIS — N1832 Chronic kidney disease, stage 3b: Secondary | ICD-10-CM | POA: Diagnosis not present

## 2020-01-03 DIAGNOSIS — E1165 Type 2 diabetes mellitus with hyperglycemia: Secondary | ICD-10-CM | POA: Diagnosis not present

## 2020-01-03 DIAGNOSIS — Z794 Long term (current) use of insulin: Secondary | ICD-10-CM | POA: Diagnosis not present

## 2020-01-03 DIAGNOSIS — E11319 Type 2 diabetes mellitus with unspecified diabetic retinopathy without macular edema: Secondary | ICD-10-CM | POA: Diagnosis not present

## 2020-01-03 DIAGNOSIS — E1129 Type 2 diabetes mellitus with other diabetic kidney complication: Secondary | ICD-10-CM | POA: Diagnosis not present

## 2020-01-03 DIAGNOSIS — E782 Mixed hyperlipidemia: Secondary | ICD-10-CM | POA: Diagnosis not present

## 2020-02-20 DIAGNOSIS — I214 Non-ST elevation (NSTEMI) myocardial infarction: Secondary | ICD-10-CM

## 2020-02-20 HISTORY — DX: Non-ST elevation (NSTEMI) myocardial infarction: I21.4

## 2020-02-29 DIAGNOSIS — N184 Chronic kidney disease, stage 4 (severe): Secondary | ICD-10-CM | POA: Diagnosis not present

## 2020-02-29 DIAGNOSIS — I10 Essential (primary) hypertension: Secondary | ICD-10-CM | POA: Diagnosis not present

## 2020-02-29 DIAGNOSIS — E1122 Type 2 diabetes mellitus with diabetic chronic kidney disease: Secondary | ICD-10-CM | POA: Diagnosis not present

## 2020-02-29 DIAGNOSIS — N2581 Secondary hyperparathyroidism of renal origin: Secondary | ICD-10-CM | POA: Diagnosis not present

## 2020-02-29 DIAGNOSIS — I129 Hypertensive chronic kidney disease with stage 1 through stage 4 chronic kidney disease, or unspecified chronic kidney disease: Secondary | ICD-10-CM | POA: Diagnosis not present

## 2020-02-29 DIAGNOSIS — R809 Proteinuria, unspecified: Secondary | ICD-10-CM | POA: Diagnosis not present

## 2020-02-29 DIAGNOSIS — D631 Anemia in chronic kidney disease: Secondary | ICD-10-CM | POA: Diagnosis not present

## 2020-03-05 DIAGNOSIS — I10 Essential (primary) hypertension: Secondary | ICD-10-CM | POA: Diagnosis not present

## 2020-03-05 DIAGNOSIS — E1122 Type 2 diabetes mellitus with diabetic chronic kidney disease: Secondary | ICD-10-CM | POA: Diagnosis not present

## 2020-03-05 DIAGNOSIS — N184 Chronic kidney disease, stage 4 (severe): Secondary | ICD-10-CM | POA: Diagnosis not present

## 2020-03-05 DIAGNOSIS — D631 Anemia in chronic kidney disease: Secondary | ICD-10-CM | POA: Diagnosis not present

## 2020-03-05 DIAGNOSIS — R809 Proteinuria, unspecified: Secondary | ICD-10-CM | POA: Diagnosis not present

## 2020-03-05 DIAGNOSIS — N2581 Secondary hyperparathyroidism of renal origin: Secondary | ICD-10-CM | POA: Diagnosis not present

## 2020-03-12 ENCOUNTER — Other Ambulatory Visit: Payer: Self-pay

## 2020-03-12 ENCOUNTER — Emergency Department: Payer: PPO

## 2020-03-12 ENCOUNTER — Encounter: Payer: Self-pay | Admitting: Emergency Medicine

## 2020-03-12 ENCOUNTER — Inpatient Hospital Stay
Admission: EM | Admit: 2020-03-12 | Discharge: 2020-03-14 | DRG: 246 | Disposition: A | Discharge status: Home / Self Care | Payer: PPO | Attending: Internal Medicine | Admitting: Internal Medicine

## 2020-03-12 DIAGNOSIS — I2584 Coronary atherosclerosis due to calcified coronary lesion: Secondary | ICD-10-CM | POA: Diagnosis not present

## 2020-03-12 DIAGNOSIS — R778 Other specified abnormalities of plasma proteins: Secondary | ICD-10-CM

## 2020-03-12 DIAGNOSIS — I130 Hypertensive heart and chronic kidney disease with heart failure and stage 1 through stage 4 chronic kidney disease, or unspecified chronic kidney disease: Secondary | ICD-10-CM | POA: Diagnosis not present

## 2020-03-12 DIAGNOSIS — I10 Essential (primary) hypertension: Secondary | ICD-10-CM | POA: Diagnosis not present

## 2020-03-12 DIAGNOSIS — Z20822 Contact with and (suspected) exposure to covid-19: Secondary | ICD-10-CM | POA: Diagnosis present

## 2020-03-12 DIAGNOSIS — E669 Obesity, unspecified: Secondary | ICD-10-CM | POA: Diagnosis not present

## 2020-03-12 DIAGNOSIS — N1832 Chronic kidney disease, stage 3b: Secondary | ICD-10-CM | POA: Diagnosis present

## 2020-03-12 DIAGNOSIS — I214 Non-ST elevation (NSTEMI) myocardial infarction: Principal | ICD-10-CM | POA: Diagnosis present

## 2020-03-12 DIAGNOSIS — I5021 Acute systolic (congestive) heart failure: Secondary | ICD-10-CM | POA: Diagnosis present

## 2020-03-12 DIAGNOSIS — Z825 Family history of asthma and other chronic lower respiratory diseases: Secondary | ICD-10-CM | POA: Diagnosis not present

## 2020-03-12 DIAGNOSIS — E1165 Type 2 diabetes mellitus with hyperglycemia: Secondary | ICD-10-CM

## 2020-03-12 DIAGNOSIS — Z79899 Other long term (current) drug therapy: Secondary | ICD-10-CM

## 2020-03-12 DIAGNOSIS — Z90710 Acquired absence of both cervix and uterus: Secondary | ICD-10-CM | POA: Diagnosis not present

## 2020-03-12 DIAGNOSIS — I255 Ischemic cardiomyopathy: Secondary | ICD-10-CM | POA: Diagnosis not present

## 2020-03-12 DIAGNOSIS — I700 Atherosclerosis of aorta: Secondary | ICD-10-CM | POA: Diagnosis not present

## 2020-03-12 DIAGNOSIS — H9190 Unspecified hearing loss, unspecified ear: Secondary | ICD-10-CM | POA: Diagnosis present

## 2020-03-12 DIAGNOSIS — Z6831 Body mass index (BMI) 31.0-31.9, adult: Secondary | ICD-10-CM

## 2020-03-12 DIAGNOSIS — Z9889 Other specified postprocedural states: Secondary | ICD-10-CM | POA: Diagnosis not present

## 2020-03-12 DIAGNOSIS — I2510 Atherosclerotic heart disease of native coronary artery without angina pectoris: Secondary | ICD-10-CM | POA: Diagnosis not present

## 2020-03-12 DIAGNOSIS — Z7984 Long term (current) use of oral hypoglycemic drugs: Secondary | ICD-10-CM | POA: Diagnosis not present

## 2020-03-12 DIAGNOSIS — E119 Type 2 diabetes mellitus without complications: Secondary | ICD-10-CM | POA: Diagnosis not present

## 2020-03-12 DIAGNOSIS — Z885 Allergy status to narcotic agent status: Secondary | ICD-10-CM

## 2020-03-12 DIAGNOSIS — Z803 Family history of malignant neoplasm of breast: Secondary | ICD-10-CM | POA: Diagnosis not present

## 2020-03-12 DIAGNOSIS — Z794 Long term (current) use of insulin: Secondary | ICD-10-CM

## 2020-03-12 DIAGNOSIS — R079 Chest pain, unspecified: Secondary | ICD-10-CM | POA: Diagnosis not present

## 2020-03-12 DIAGNOSIS — R7989 Other specified abnormal findings of blood chemistry: Secondary | ICD-10-CM | POA: Diagnosis not present

## 2020-03-12 DIAGNOSIS — Z6834 Body mass index (BMI) 34.0-34.9, adult: Secondary | ICD-10-CM | POA: Diagnosis not present

## 2020-03-12 DIAGNOSIS — I959 Hypotension, unspecified: Secondary | ICD-10-CM | POA: Diagnosis not present

## 2020-03-12 DIAGNOSIS — E1122 Type 2 diabetes mellitus with diabetic chronic kidney disease: Secondary | ICD-10-CM | POA: Diagnosis not present

## 2020-03-12 DIAGNOSIS — E782 Mixed hyperlipidemia: Secondary | ICD-10-CM | POA: Diagnosis not present

## 2020-03-12 DIAGNOSIS — R0789 Other chest pain: Secondary | ICD-10-CM | POA: Diagnosis not present

## 2020-03-12 DIAGNOSIS — N183 Chronic kidney disease, stage 3 unspecified: Secondary | ICD-10-CM

## 2020-03-12 DIAGNOSIS — R072 Precordial pain: Secondary | ICD-10-CM | POA: Diagnosis not present

## 2020-03-12 DIAGNOSIS — E1121 Type 2 diabetes mellitus with diabetic nephropathy: Secondary | ICD-10-CM

## 2020-03-12 DIAGNOSIS — E785 Hyperlipidemia, unspecified: Secondary | ICD-10-CM | POA: Diagnosis not present

## 2020-03-12 DIAGNOSIS — N184 Chronic kidney disease, stage 4 (severe): Secondary | ICD-10-CM

## 2020-03-12 LAB — PROTIME-INR
INR: 1 (ref 0.8–1.2)
Prothrombin Time: 13 seconds (ref 11.4–15.2)

## 2020-03-12 LAB — RESP PANEL BY RT-PCR (FLU A&B, COVID) ARPGX2
Influenza A by PCR: NEGATIVE
Influenza B by PCR: NEGATIVE
SARS Coronavirus 2 by RT PCR: NEGATIVE

## 2020-03-12 LAB — CBC
HCT: 40.7 % (ref 36.0–46.0)
Hemoglobin: 13.4 g/dL (ref 12.0–15.0)
MCH: 30 pg (ref 26.0–34.0)
MCHC: 32.9 g/dL (ref 30.0–36.0)
MCV: 91.1 fL (ref 80.0–100.0)
Platelets: 306 10*3/uL (ref 150–400)
RBC: 4.47 MIL/uL (ref 3.87–5.11)
RDW: 13.5 % (ref 11.5–15.5)
WBC: 9.9 10*3/uL (ref 4.0–10.5)
nRBC: 0 % (ref 0.0–0.2)

## 2020-03-12 LAB — BASIC METABOLIC PANEL
Anion gap: 5 (ref 5–15)
BUN: 27 mg/dL — ABNORMAL HIGH (ref 8–23)
CO2: 24 mmol/L (ref 22–32)
Calcium: 8.3 mg/dL — ABNORMAL LOW (ref 8.9–10.3)
Chloride: 105 mmol/L (ref 98–111)
Creatinine, Ser: 1.71 mg/dL — ABNORMAL HIGH (ref 0.44–1.00)
GFR, Estimated: 30 mL/min — ABNORMAL LOW (ref >60)
Glucose, Bld: 274 mg/dL — ABNORMAL HIGH (ref 70–99)
Potassium: 4.1 mmol/L (ref 3.5–5.1)
Sodium: 134 mmol/L — ABNORMAL LOW (ref 135–145)

## 2020-03-12 LAB — TROPONIN I (HIGH SENSITIVITY)
Troponin I (High Sensitivity): 161 ng/L (ref <18)
Troponin I (High Sensitivity): 669 ng/L (ref <18)

## 2020-03-12 LAB — APTT: aPTT: 27 seconds (ref 24–36)

## 2020-03-12 MED ORDER — HEPARIN (PORCINE) 25000 UT/250ML-% IV SOLN
1000.0000 [IU]/h | INTRAVENOUS | Status: DC
  Administered 2020-03-12: 800 [IU]/h via INTRAVENOUS
  Filled 2020-03-12: qty 250

## 2020-03-12 MED ORDER — SODIUM CHLORIDE 0.9 % IV SOLN: Freq: Once | INTRAVENOUS | Status: AC

## 2020-03-12 MED ORDER — HEPARIN BOLUS VIA INFUSION
4000.0000 [IU] | Freq: Once | INTRAVENOUS | Status: AC
  Administered 2020-03-12: 4000 [IU] via INTRAVENOUS
  Filled 2020-03-12: qty 4000

## 2020-03-12 NOTE — ED Provider Notes (Signed)
[facility] Emergency Department Provider Note   ____________________________________________   I have reviewed the triage vital signs and the nursing notes.   HISTORY  Chief Complaint Chest Pain   History limited by: Not Limited   HPI Margaret Payne is a 79 y.o. female who presents to the emergency department today because of concerns for episode of chest pain.  Patient states that the pain started when she was outside.  She felt like the cold air was what started.  Pain was located in the center part of her chest.  She describes it as pressure-like.  It did not radiate.  She denies any associated shortness of breath or diaphoresis.  EMS gave the patient for aspirin as well as nitroglycerin.  After this occurred the patient stated that the pain improved.  She now states that she has some discomfort and rates it a 3 or 4 out of 10.  When it started it was 8 or 9. Patient has history of diabetes, HLD and HTN however denies any history of heart disease.   Records reviewed. Per medical record review patient has a history of DM, HLD, HTN.   Past Medical History:  Diagnosis Date  . Arthritis   . Diabetes mellitus without complication ([place])    type 2  . Headache    migrains  . HOH (hard of hearing)   . Hyperlipidemia   . Hypertension     Patient Active Problem List   Diagnosis Date Noted  . Hard of hearing 01/28/2019  . Chronic kidney disease, stage IV (severe) ([place]) 05/14/2018  . Osteopenia 11/21/2015  . Vitamin D deficiency 10/11/2015  . Allergic rhinitis 10/10/2015  . Type 2 diabetes mellitus with diabetic nephropathy, with long-term current use of insulin ([place]) 10/10/2015  . Hypertension 10/10/2015  . History of migraine headaches 10/10/2015  . Obesity 10/10/2015    Past Surgical History:  Procedure Laterality Date  . ABDOMINAL HYSTERECTOMY    . BREAST BIOPSY    . CATARACT EXTRACTION W/PHACO Right 10/03/2014   Procedure: CATARACT EXTRACTION  PHACO AND INTRAOCULAR LENS PLACEMENT (IOC);  Surgeon: Birder Robson, MD;  Location: [facility] ORS;  Service: Ophthalmology;  Laterality: Right;  [place] 00:57 AP% 16.7 CDE 9.52 Fluid pack lot#[id number]  . INJECTION KNEE      Prior to Admission medications   Medication Sig Start Date End Date Taking? Authorizing Provider  Cholecalciferol (VITAMIN D3) 5000 units TABS Take 5,000 Units by mouth daily.    [provider]  Dulaglutide (TRULICITY) 1.5 DU/2.0UR SOPN Inject into the skin once a week.     [provider]  glipiZIDE (GLUCOTROL XL) 10 MG 24 hr tablet Take 10 mg by mouth 2 (two) times daily.  Patient not taking: Reported on 11/29/2019    [provider]  insulin aspart protamine- aspart (NOVOLOG MIX 70/30) (70-30) 100 UNIT/ML injection Inject into the skin 2 (two) times daily with a meal.  Patient not taking: Reported on 11/29/2019    [provider]  insulin lispro protamine-lispro (HUMALOG 75/25 MIX) (75-25) 100 UNIT/ML SUSP injection Inject into the skin 2 (two) times daily with a meal. 40 units in AM and 20 units in PM    [provider]  lisinopril (PRINIVIL,ZESTRIL) 40 MG tablet Take 40 mg by mouth daily.    [provider]  Multiple Vitamin (MULTI-VITAMINS) TABS Take by mouth. One a day vitamin- womens health    [provider]  pravastatin (PRAVACHOL) 80 MG tablet Take 40  mg by mouth daily.  Patient not taking: Reported on 11/29/2019 03/20/17 11/22/18  [provider]  propranolol (INDERAL) 20 MG tablet Take 0.5 tablets (10 mg total) by mouth 2 (two) times daily. Patient not taking: Reported on 11/29/2019 01/28/19   Birdie Sons, MD    Allergies Codeine sulfate  Family History  Problem Relation Age of Onset  . Breast cancer Mother   . Emphysema Father   . Breast cancer Sister 66    Social History Social History   Tobacco Use  . Smoking status: Never Smoker  . Smokeless tobacco: Never Used  Vaping Use  .  Vaping Use: Never used  Substance Use Topics  . Alcohol use: No    Alcohol/week: 0.0 standard drinks  . Drug use: No    Review of Systems Constitutional: No fever/chills Eyes: No visual changes. ENT: No sore throat. Cardiovascular: Positive for chest pressure. Respiratory: Denies shortness of breath. Gastrointestinal: No abdominal pain.  No nausea, no vomiting.  No diarrhea.   Genitourinary: Negative for dysuria. Musculoskeletal: Negative for back pain. Skin: Negative for rash. Neurological: Negative for headaches, focal weakness or numbness.  ____________________________________________   PHYSICAL EXAM:  VITAL SIGNS: ED Triage Vitals  Enc Vitals Group     BP 03/12/20 1754 (!) 150/69     Pulse Rate 03/12/20 1754 80     Resp 03/12/20 1754 18     Temp 03/12/20 1754 97.8 F (36.6 C)     Temp Source 03/12/20 1754 Oral     SpO2 03/12/20 1754 99 %     Weight 03/12/20 1755 172 lb (78 kg)     Height 03/12/20 1755 _0  (1.575 m)     Head Circumference --      Peak Flow --      Pain Score 03/12/20 1754 4   Constitutional: Alert and oriented.  Eyes: Conjunctivae are normal.  ENT      Head: Normocephalic and atraumatic.      Nose: No congestion/rhinnorhea.      Mouth/Throat: Mucous membranes are moist.      Neck: No stridor. Hematological/Lymphatic/Immunilogical: No cervical lymphadenopathy. Cardiovascular: Normal rate, regular rhythm.  No murmurs, rubs, or gallops.  Respiratory: Normal respiratory effort without tachypnea nor retractions. Breath sounds are clear and equal bilaterally. No wheezes/rales/rhonchi. Gastrointestinal: Soft and non tender. No rebound. No guarding.  Genitourinary: Deferred Musculoskeletal: Normal range of motion in all extremities. No lower extremity edema. Neurologic:  Normal speech and language. No gross focal neurologic deficits are appreciated.  Skin:  Skin is warm, dry and intact. No rash noted. Psychiatric: Mood and affect are normal. Speech  and behavior are normal. Patient exhibits appropriate insight and judgment.  ____________________________________________    LABS (pertinent positives/negatives)  Trop hs 161 to 669 CBC wbc 9.9, hgb 13.4, plt 306 BMP na 134, k 4.1, glu 274, cr 1.71  ____________________________________________   EKG  I, Nance Pear, attending physician, personally viewed and interpreted this EKG  EKG Time: 1759 Rate: 75 Rhythm: normal sinus rhythm Axis: normal Intervals: qtc 415 QRS: narrow ST changes: j point elevation v3, v4 no st depression Impression: abnormal ekg  ____________________________________________    RADIOLOGY  CXR No active cardiopulmonary disease  ____________________________________________   PROCEDURES  Procedures  CRITICAL CARE Performed by: Nance Pear   Total critical care time: 35 minutes  Critical care time was exclusive of separately billable procedures and treating other patients.  Critical care was necessary to treat or prevent imminent or life-threatening deterioration.  Critical care was time spent personally by me on the following activities: development of treatment plan with patient and/or surrogate as well as nursing, discussions with consultants, evaluation of patient's response to treatment, examination of patient, obtaining history from patient or surrogate, ordering and performing treatments and interventions, ordering and review of laboratory studies, ordering and review of radiographic studies, pulse oximetry and re-evaluation of patient's condition.  ____________________________________________   INITIAL IMPRESSION / ASSESSMENT AND PLAN / ED COURSE  Pertinent labs & imaging results that were available during my care of the patient were reviewed by me and considered in my medical decision making (see chart for details).   Patient presented to the emergency department because of concerns for chest pain.  Patient was given  aspirin and nitroglycerin by EMS and did have some relief of her playing it went from 8-9 out of 10 down to 3-4 out of 10.  On my exam patient is resting comfortably in the stretcher.  No respiratory distress.  Initial troponin elevated at 161.  I did contact Dr. Fletcher Anon with cardiology who reviewed the patient's EKGs.  At this time he did not think that she met STEMI criteria.  Recommended heparin drip and potential catheterization in the morning. Discussed findings and plan with patient.   ____________________________________________   FINAL CLINICAL IMPRESSION(S) / ED DIAGNOSES  Final diagnoses:  Chest pain, unspecified type  Elevated troponin     Note: This dictation was prepared with Dragon dictation. Any transcriptional errors that result from this process are unintentional     Nance Pear, MD 03/12/20 2059
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
## 2020-03-12 NOTE — Progress Notes (Deleted)
Complete physical exam   Patient: Margaret Payne   DOB: [birth year]   79 y.o. Female  MRN: [id number] Visit Date: 03/13/2020  Today's healthcare provider: Lelon Huh, MD   No chief complaint on file.  Subjective    KALIOPI Payne is a 79 y.o. female who presents today for a complete physical exam.  She reports consuming a {diet types:17450} diet. {Exercise:19826} She generally feels {well/fairly well/poorly:18703}. She reports sleeping {well/fairly well/poorly:18703}. She {does/does not:200015} have additional problems to discuss today.   Had AWV with [facility] on 11/29/2019.   HPI  ***  Past Medical History:  Diagnosis Date  . Arthritis   . Diabetes mellitus without complication ([place])    type 2  . Headache    migrains  . HOH (hard of hearing)   . Hyperlipidemia   . Hypertension    Past Surgical History:  Procedure Laterality Date  . ABDOMINAL HYSTERECTOMY    . BREAST BIOPSY    . CATARACT EXTRACTION W/PHACO Right 10/03/2014   Procedure: CATARACT EXTRACTION PHACO AND INTRAOCULAR LENS PLACEMENT (IOC);  Surgeon: Birder Robson, MD;  Location: [facility] ORS;  Service: Ophthalmology;  Laterality: Right;  [place] 00:57 AP% 16.7 CDE 9.52 Fluid pack lot#[id number]  . INJECTION KNEE     Social History   Socioeconomic History  . Marital status: Divorced    Spouse name: Not on file  . Number of children: 1  . Years of education: Not on file  . Highest education level: Associate degree: occupational, Hotel manager, or vocational program  Occupational History  . Occupation: Retired  Tobacco Use  . Smoking status: Never Smoker  . Smokeless tobacco: Never Used  Vaping Use  . Vaping Use: Never used  Substance and Sexual Activity  . Alcohol use: No    Alcohol/week: 0.0 standard drinks  . Drug use: No  . Sexual activity: Not on file  Other Topics Concern  . Not on file  Social History Narrative  . Not on file   Social Determinants of Health   Financial Resource Strain: Low Risk    . Difficulty of Paying Living Expenses: Not hard at all  Food Insecurity: No Food Insecurity  . Worried About Charity fundraiser in the Last Year: Never true  . Ran Out of Food in the Last Year: Never true  Transportation Needs: No Transportation Needs  . Lack of Transportation (Medical): No  . Lack of Transportation (Non-Medical): No  Physical Activity: Inactive  . Days of Exercise per Week: 0 days  . Minutes of Exercise per Session: 0 min  Stress: No Stress Concern Present  . Feeling of Stress : Not at all  Social Connections: Moderately Isolated  . Frequency of Communication with Friends and Family: More than three times a week  . Frequency of Social Gatherings with Friends and Family: More than three times a week  . Attends Religious Services: Never  . Active Member of Clubs or Organizations: Yes  . Attends Archivist Meetings: More than 4 times per year  . Marital Status: Divorced  Human resources officer Violence: Not At Risk  . Fear of Current or Ex-Partner: No  . Emotionally Abused: No  . Physically Abused: No  . Sexually Abused: No   Family Status  Relation Name Status  . Mother  Deceased at age 83       old age  . Father  Deceased  . Sister 32 Deceased       died from respiratory  issues  . Daughter  Alive   Family History  Problem Relation Age of Onset  . Breast cancer Mother   . Emphysema Father   . Breast cancer Sister 62   Allergies  Allergen Reactions  . Codeine Sulfate Nausea Only    Patient Care Team: Birdie Sons, MD as PCP - General (Family Medicine) Gabriel Carina, Betsey Holiday, MD as Physician Assistant (Internal Medicine) Birder Robson, MD as Referring Physician (Ophthalmology) Lovell Sheehan, MD as Consulting Physician (Orthopedic Surgery) Lavonia Dana, MD as Consulting Physician (Internal Medicine)   Medications: Outpatient Medications Prior to Visit  Medication Sig  . Cholecalciferol (VITAMIN D3) 5000 units TABS Take 5,000 Units by  mouth daily.  . Dulaglutide (TRULICITY) 1.5 PJ/8.2NK SOPN Inject into the skin once a week.   Marland Kitchen glipiZIDE (GLUCOTROL XL) 10 MG 24 hr tablet Take 10 mg by mouth 2 (two) times daily.  (Patient not taking: Reported on 11/29/2019)  . insulin aspart protamine- aspart (NOVOLOG MIX 70/30) (70-30) 100 UNIT/ML injection Inject into the skin 2 (two) times daily with a meal.  (Patient not taking: Reported on 11/29/2019)  . insulin lispro protamine-lispro (HUMALOG 75/25 MIX) (75-25) 100 UNIT/ML SUSP injection Inject into the skin 2 (two) times daily with a meal. 40 units in AM and 20 units in PM  . lisinopril (PRINIVIL,ZESTRIL) 40 MG tablet Take 40 mg by mouth daily.  . Multiple Vitamin (MULTI-VITAMINS) TABS Take by mouth. One a day vitamin- womens health  . pravastatin (PRAVACHOL) 80 MG tablet Take 40 mg by mouth daily.  (Patient not taking: Reported on 11/29/2019)  . propranolol (INDERAL) 20 MG tablet Take 0.5 tablets (10 mg total) by mouth 2 (two) times daily. (Patient not taking: Reported on 11/29/2019)   No facility-administered medications prior to visit.    Review of Systems  {Heme  Chem  Endocrine  Serology  Results Review (optional):23779::" "}  Objective    There were no vitals taken for this visit. {Show previous vital signs (optional):23777::" "}  Physical Exam  ***  Last depression screening scores PHQ 2/9 Scores 11/29/2019 11/22/2018 11/10/2017  PHQ - 2 Score 0 0 0  PHQ- 9 Score - - 2   Last fall risk screening Fall Risk  11/29/2019  Falls in the past year? 0  Number falls in past yr: 0  Injury with Fall? 0   Last Audit-C alcohol use screening Alcohol Use Disorder Test (AUDIT) 11/29/2019  1. How often do you have a drink containing alcohol? 0  2. How many drinks containing alcohol do you have on a typical day when you are drinking? 0  3. How often do you have six or more drinks on one occasion? 0  AUDIT-C Score 0   A score of 3 or more in women, and 4 or more in men indicates  increased risk for alcohol abuse, EXCEPT if all of the points are from question 1   No results found for any visits on 03/13/20.  Assessment & Plan    Routine Health Maintenance and Physical Exam  Exercise Activities and Dietary recommendations Goals    . Exercise 3x per week (30 min per time)     Recommend to exercise for 3 days a week for at least 30 minutes at a time.        Immunization History  Administered Date(s) Administered  . Fluad Quad(high Dose 65+) 01/28/2019  . Influenza, High Dose Seasonal PF 02/23/2016, 02/15/2018  . Influenza,inj,Quad PF,6+ Mos 01/23/2017  . Moderna  SARS-COVID-2 Vaccination 06/18/2019, 07/16/2019, 02/21/2020  . Pneumococcal Conjugate-13 10/11/2015  . Pneumococcal Polysaccharide-23 11/10/2017  . Zoster 04/17/2008    Health Maintenance  Topic Date Due  . INFLUENZA VACCINE  11/20/2019  . TETANUS/TDAP  10/20/2026 (Originally 10/19/1960)  . HEMOGLOBIN A1C  03/21/2020  . OPHTHALMOLOGY EXAM  09/06/2020  . FOOT EXAM  09/19/2020  . DEXA SCAN  02/23/2022  . COVID-19 Vaccine  Completed  . PNA vac Low Risk Adult  Completed    Discussed health benefits of physical activity, and encouraged her to engage in regular exercise appropriate for her age and condition.  ***  No follow-ups on file.     {provider attestation***:1}   Lelon Huh, MD  [facility] [phone] (phone) [phone] (fax)  [place]

## 2020-03-12 NOTE — Consult Note (Signed)
ANTICOAGULATION CONSULT NOTE  Pharmacy Consult for Heparin Infusion Indication: chest pain/ACS  Patient Measurements: Heparin Dosing Weight: 67.2 kg  Labs: Recent Labs    03/12/20 1805 03/12/20 1945  HGB 13.4  --   HCT 40.7  --   PLT 306  --   CREATININE 1.71*  --   TROPONINIHS 161* 669*    Estimated Creatinine Clearance: 26.2 mL/min (A) (by C-G formula based on SCr of 1.71 mg/dL (H)).   Medical History: Past Medical History:  Diagnosis Date  . Arthritis   . Diabetes mellitus without complication ([place])    type 2  . Headache    migrains  . HOH (hard of hearing)   . Hyperlipidemia   . Hypertension     Medications:  No anticoagulation prior to admission per my chart review  Assessment: Patient is a 79 y/o F with medical history as above who presented to the ED 11/22 with chest pain. Troponin 161 >> 669. Pharmacy has been consulted to initiate heparin infusion for ACS.  Baseline CBC within normal limits. Baseline aPTT and PT-INR pending.   Goal of Therapy:  Heparin level 0.3-0.7 units/ml Monitor platelets by anticoagulation protocol: Yes   Plan:  --Heparin 4000 unit IV bolus x 1 followed by continuous infusion at 800 units/hr --Heparin level 8 hours after initiation of infusion --Daily CBC per protocol while on heparin drip  Benita Gutter 03/12/2020,9:00 PM

## 2020-03-12 NOTE — H&P (Signed)
History and Physical  Margaret Payne HXT:[id number] DOB: 03/18/1941 DOA: 03/12/2020  Referring physician: Nance Pear, MD PCP: Birdie Sons, MD  Patient coming from: Home  Chief Complaint: Chest pain  HPI: Margaret Payne is a 79 y.o. female with medical history significant for type 2 diabetes mellitus, hypertension, hyperlipidemia, hard of hearing, CKD stage III and obesity who presents to the emergency department due to pain that started earlier today.  Chest pain started while she was outside, so she thought it was due to cold air, however, the pain persisted, it was located in midsternal and left-sided chest area, it was rated as 9/10 on pain scale and was described as pressure-like, pain was nonradiating, she denied shortness of breath or diaphoresis.  EMS was activated, on arrival of EMS, 4 baby aspirin and sublingual nitroglycerin was given with improvement in chest pain (3-4/10).  ED Course:  In the emergency department, she was hemodynamically stable.  Work-up in the ED showed normal CBC, BUN to creatinine 27/1.71 (creatinine was 2.0 a year ago).  Troponin x2 161> 669.  Chest x-ray showed no active cardiopulmonary disease.  Cardiology (Dr. Fletcher Anon) was consulted per ED physician and on reviewing the EKG, it was not thought that patient met STEMI criteria, heparin drip with potential catheterization in the morning was recommended.  Hospitalist was asked to admit patient for further evaluation and management.  Review of Systems: Constitutional: Negative for chills and fever.  HENT: Negative for ear pain and sore throat.   Eyes: Negative for pain and visual disturbance.  Respiratory: Negative for cough, chest tightness and shortness of breath.   Cardiovascular: Positive for chest pain.  Negative for palpitations.  Gastrointestinal: Negative for abdominal pain and vomiting.  Endocrine: Negative for polyphagia and polyuria.  Genitourinary: Negative for decreased urine volume,  dysuria, enuresis Musculoskeletal: Negative for arthralgias and back pain.  Skin: Negative for color change and rash.  Allergic/Immunologic: Negative for immunocompromised state.  Neurological: Negative for tremors, syncope, speech difficulty, weakness, light-headedness and headaches.  Hematological: Does not bruise/bleed easily.  All other systems reviewed and are negative  Past Medical History:  Diagnosis Date  . Arthritis   . Diabetes mellitus without complication ([place])    type 2  . Headache    migrains  . HOH (hard of hearing)   . Hyperlipidemia   . Hypertension    Past Surgical History:  Procedure Laterality Date  . ABDOMINAL HYSTERECTOMY    . BREAST BIOPSY    . CATARACT EXTRACTION W/PHACO Right 10/03/2014   Procedure: CATARACT EXTRACTION PHACO AND INTRAOCULAR LENS PLACEMENT (IOC);  Surgeon: Birder Robson, MD;  Location: [facility] ORS;  Service: Ophthalmology;  Laterality: Right;  [place] 00:57 AP% 16.7 CDE 9.52 Fluid pack lot#[id number]  . INJECTION KNEE      Social History:  reports that she has never smoked. She has never used smokeless tobacco. She reports that she does not drink alcohol and does not use drugs.   Allergies  Allergen Reactions  . Codeine Sulfate Nausea Only    Family History  Problem Relation Age of Onset  . Breast cancer Mother   . Emphysema Father   . Breast cancer Sister 92    Prior to Admission medications   Medication Sig Start Date End Date Taking? Authorizing Provider  acetaminophen (TYLENOL) 500 MG tablet Take 500 mg by mouth every 6 (six) hours as needed.   Yes [provider]  Cholecalciferol (VITAMIN D3) 5000 units TABS Take 5,000 Units by mouth daily.  Yes [provider]  Dulaglutide (TRULICITY) 1.5 TK/1.6WF SOPN Inject into the skin once a week.    Yes [provider]  insulin lispro protamine-lispro (HUMALOG 75/25 MIX) (75-25) 100 UNIT/ML SUSP injection Inject into the skin 2 (two) times daily with a meal. 40  units in AM and 20 units in PM   Yes [provider]  lisinopril (PRINIVIL,ZESTRIL) 40 MG tablet Take 40 mg by mouth daily.   Yes [provider]  Multiple Vitamin (MULTI-VITAMINS) TABS Take by mouth. One a day vitamin- womens health   Yes [provider]  pravastatin (PRAVACHOL) 20 MG tablet Take 20 mg by mouth daily.  03/20/17 03/12/20 Yes [provider]  glipiZIDE (GLUCOTROL XL) 10 MG 24 hr tablet Take 10 mg by mouth 2 (two) times daily.  Patient not taking: Reported on 11/29/2019    [provider]  insulin aspart protamine- aspart (NOVOLOG MIX 70/30) (70-30) 100 UNIT/ML injection Inject into the skin 2 (two) times daily with a meal.  Patient not taking: Reported on 11/29/2019    [provider]  propranolol (INDERAL) 20 MG tablet Take 0.5 tablets (10 mg total) by mouth 2 (two) times daily. Patient not taking: Reported on 11/29/2019 01/28/19   Birdie Sons, MD    Physical Exam: BP 132/75   Pulse 78   Temp 97.8 F (36.6 C) (Oral)   Resp 18   Ht $R'5\' 2"'wd$  (1.575 m)   Wt 78 kg   SpO2 98%   BMI 31.46 kg/m   . General: 79 y.o. year-old female well developed well nourished in no acute distress.  Alert and oriented x3. Marland Kitchen HEENT: NCAT, EOMI . Neck: Supple, trachea media . Cardiovascular: Regular rate and rhythm with no rubs or gallops.  No thyromegaly or JVD noted.  No lower extremity edema. 2/4 pulses in all 4 extremities. Marland Kitchen Respiratory: Clear to auscultation with no wheezes or rales. Good inspiratory effort. . Abdomen: Soft nontender nondistended with normal bowel sounds x4 quadrants. . Muskuloskeletal: No cyanosis, clubbing or edema noted bilaterally . Neuro: CN II-XII intact, strength, sensation, reflexes . Skin: No ulcerative lesions noted or rashes . Psychiatry: Judgement and insight appear normal. Mood is appropriate for condition and setting          Labs on Admission:  Basic Metabolic Panel: Recent Labs  Lab 03/12/20 1805   NA 134*  K 4.1  CL 105  CO2 24  GLUCOSE 274*  BUN 27*  CREATININE 1.71*  CALCIUM 8.3*   Liver Function Tests: No results for input(s): AST, ALT, ALKPHOS, BILITOT, PROT, ALBUMIN in the last 168 hours. No results for input(s): LIPASE, AMYLASE in the last 168 hours. No results for input(s): AMMONIA in the last 168 hours. CBC: Recent Labs  Lab 03/12/20 1805  WBC 9.9  HGB 13.4  HCT 40.7  MCV 91.1  PLT 306   Cardiac Enzymes: No results for input(s): CKTOTAL, CKMB, CKMBINDEX, TROPONINI in the last 168 hours.  BNP (last 3 results) No results for input(s): BNP in the last 8760 hours.  ProBNP (last 3 results) No results for input(s): PROBNP in the last 8760 hours.  CBG: No results for input(s): GLUCAP in the last 168 hours.  Radiological Exams on Admission: DG Chest 2 View  Result Date: 03/12/2020 CLINICAL DATA:  Chest pain. EXAM: CHEST - 2 VIEW COMPARISON:  None. FINDINGS: The heart size and mediastinal contours are within normal limits. There is mild calcification of the aortic arch. Both lungs are  clear. Mild dextroscoliosis of the thoracic spine is seen. IMPRESSION: No active cardiopulmonary disease. Electronically Signed   By: Virgina Norfolk M.D.   On: 03/12/2020 19:05    EKG: I independently viewed the EKG done and my findings are as followed: Sinus rhythm at rate of 81 bpm, ST elevation noted in leads II,III, aVF, V4 and V5  Assessment/Plan Present on Admission: . Chest pain . Hypertension . Obesity  Principal Problem:   Chest pain Active Problems:   Hypertension   Obesity   Hard of hearing   CKD (chronic kidney disease), stage III (HCC)   Hyperglycemia due to diabetes mellitus (HCC)   Chest pain rule out ACS Cardiovascular risk factors include hypertension, diabetes, obesity Continue telemetry  Serial troponins 161 > 669; continue to trend troponin  EKG showed Sinus rhythm at rate of 81 bpm, ST elevation noted in leads II,III, aVF, V4 and V5 Patient  does not meet STEMI criteria per cardiology as reported by ED physician Continue heparin drip Continue nitroglycerin as needed for chest pain Continue aspirin 81 mg p.o. daily Patient will be placed n.p.o. at midnight in anticipation for possible catheterization in the morning Cardiology will follow up with patient in the morning to determine if patient requires catheterization  Elevated troponin-NSTEMI vs type II demand ischemia Patient does not meet STEMI criteria as described above Continue trending troponin Cardiology will be consulted for further recommendation in the morning  Essential hypertension (controlled) Continue lisinopril per home regimen  Hyperlipidemia Continue Pravachol  Hyperglycemia secondary to poorly controlled type 2 diabetes mellitus Continue insulin sliding scale and hypoglycemia protocol  CKD stage IIIb BUN to creatinine 27/1.71 (creatinine was 2.0 a year ago) eGFR 30 Renally adjust medications, avoid nephrotoxic agents/dehydration/hypotension  Hard of hearing-stable  Obesity (BMI 31.46) Patient was counseled diet and lifestyle modification   DVT prophylaxis: Heparin drip  Code Status: Full code  Family Communication: Daughter at bedside (all questions answered to satisfaction)  Disposition Plan:  Patient is from:                        home Anticipated DC to:                   SNF or family members home Anticipated DC date:               2-3 days Anticipated DC barriers:         Patient is unstable to be discharged home at this time due to chest pain and elevated troponin level which require cardiology evaluation and recommendation.   Consults called: Cardiology  Admission status: Inpatient    Bernadette Hoit MD [facility]  03/13/2020, 2:18 AM

## 2020-03-13 ENCOUNTER — Other Ambulatory Visit: Payer: Self-pay

## 2020-03-13 ENCOUNTER — Encounter: Payer: PPO | Admitting: Family Medicine

## 2020-03-13 ENCOUNTER — Inpatient Hospital Stay (HOSPITAL_COMMUNITY)
Admit: 2020-03-13 | Discharge: 2020-03-13 | Disposition: A | Discharge status: Home / Self Care | Payer: PPO | Attending: Internal Medicine | Admitting: Internal Medicine

## 2020-03-13 ENCOUNTER — Encounter: Payer: Self-pay | Admitting: Internal Medicine

## 2020-03-13 ENCOUNTER — Encounter
Admission: EM | Disposition: A | Discharge status: Home / Self Care | Payer: Self-pay | Source: Home / Self Care | Attending: Internal Medicine

## 2020-03-13 DIAGNOSIS — R079 Chest pain, unspecified: Secondary | ICD-10-CM

## 2020-03-13 DIAGNOSIS — E785 Hyperlipidemia, unspecified: Secondary | ICD-10-CM

## 2020-03-13 DIAGNOSIS — E669 Obesity, unspecified: Secondary | ICD-10-CM

## 2020-03-13 DIAGNOSIS — Z6834 Body mass index (BMI) 34.0-34.9, adult: Secondary | ICD-10-CM

## 2020-03-13 DIAGNOSIS — I2510 Atherosclerotic heart disease of native coronary artery without angina pectoris: Secondary | ICD-10-CM

## 2020-03-13 DIAGNOSIS — N183 Chronic kidney disease, stage 3 unspecified: Secondary | ICD-10-CM

## 2020-03-13 DIAGNOSIS — E782 Mixed hyperlipidemia: Secondary | ICD-10-CM

## 2020-03-13 DIAGNOSIS — N1832 Chronic kidney disease, stage 3b: Secondary | ICD-10-CM

## 2020-03-13 DIAGNOSIS — E1165 Type 2 diabetes mellitus with hyperglycemia: Secondary | ICD-10-CM

## 2020-03-13 DIAGNOSIS — I214 Non-ST elevation (NSTEMI) myocardial infarction: Secondary | ICD-10-CM

## 2020-03-13 DIAGNOSIS — E119 Type 2 diabetes mellitus without complications: Secondary | ICD-10-CM

## 2020-03-13 HISTORY — PX: LEFT HEART CATH AND CORONARY ANGIOGRAPHY: CATH118249

## 2020-03-13 HISTORY — PX: CORONARY STENT INTERVENTION: CATH118234

## 2020-03-13 LAB — CBC
HCT: 37.1 % (ref 36.0–46.0)
Hemoglobin: 12.2 g/dL (ref 12.0–15.0)
MCH: 29.5 pg (ref 26.0–34.0)
MCHC: 32.9 g/dL (ref 30.0–36.0)
MCV: 89.8 fL (ref 80.0–100.0)
Platelets: 286 10*3/uL (ref 150–400)
RBC: 4.13 MIL/uL (ref 3.87–5.11)
RDW: 13.5 % (ref 11.5–15.5)
WBC: 10.3 10*3/uL (ref 4.0–10.5)
nRBC: 0 % (ref 0.0–0.2)

## 2020-03-13 LAB — HEMOGLOBIN A1C
Hgb A1c MFr Bld: 7.7 % — ABNORMAL HIGH (ref 4.8–5.6)
Mean Plasma Glucose: 174.29 mg/dL

## 2020-03-13 LAB — LIPID PANEL
Cholesterol: 170 mg/dL (ref 0–200)
HDL: 39 mg/dL — ABNORMAL LOW (ref >40)
LDL Cholesterol: 91 mg/dL (ref 0–99)
Total CHOL/HDL Ratio: 4.4 RATIO
Triglycerides: 198 mg/dL — ABNORMAL HIGH (ref <150)
VLDL: 40 mg/dL (ref 0–40)

## 2020-03-13 LAB — COMPREHENSIVE METABOLIC PANEL
ALT: 22 U/L (ref 0–44)
AST: 68 U/L — ABNORMAL HIGH (ref 15–41)
Albumin: 3.6 g/dL (ref 3.5–5.0)
Alkaline Phosphatase: 74 U/L (ref 38–126)
Anion gap: 6 (ref 5–15)
BUN: 21 mg/dL (ref 8–23)
CO2: 23 mmol/L (ref 22–32)
Calcium: 8.9 mg/dL (ref 8.9–10.3)
Chloride: 108 mmol/L (ref 98–111)
Creatinine, Ser: 1.6 mg/dL — ABNORMAL HIGH (ref 0.44–1.00)
GFR, Estimated: 33 mL/min — ABNORMAL LOW (ref >60)
Glucose, Bld: 177 mg/dL — ABNORMAL HIGH (ref 70–99)
Potassium: 4.1 mmol/L (ref 3.5–5.1)
Sodium: 137 mmol/L (ref 135–145)
Total Bilirubin: 0.7 mg/dL (ref 0.3–1.2)
Total Protein: 6.7 g/dL (ref 6.5–8.1)

## 2020-03-13 LAB — POCT ACTIVATED CLOTTING TIME
Activated Clotting Time: 274 seconds
Activated Clotting Time: 296 seconds

## 2020-03-13 LAB — MAGNESIUM: Magnesium: 1.9 mg/dL (ref 1.7–2.4)

## 2020-03-13 LAB — GLUCOSE, CAPILLARY
Glucose-Capillary: 148 mg/dL — ABNORMAL HIGH (ref 70–99)
Glucose-Capillary: 158 mg/dL — ABNORMAL HIGH (ref 70–99)
Glucose-Capillary: 158 mg/dL — ABNORMAL HIGH (ref 70–99)
Glucose-Capillary: 234 mg/dL — ABNORMAL HIGH (ref 70–99)
Glucose-Capillary: 260 mg/dL — ABNORMAL HIGH (ref 70–99)

## 2020-03-13 LAB — ECHOCARDIOGRAM COMPLETE
AR max vel: 2.57 cm2
AV Area VTI: 3.49 cm2
AV Area mean vel: 2.61 cm2
AV Mean grad: 3.5 mmHg
AV Peak grad: 6.5 mmHg
Ao pk vel: 1.27 m/s
Area-P 1/2: 2.82 cm2
Calc EF: 43.9 %
Height: 62 in
S' Lateral: 1.99 cm
Single Plane A2C EF: 24.6 %
Single Plane A4C EF: 59.8 %
Weight: 2691.38 oz

## 2020-03-13 LAB — PROTIME-INR
INR: 1.1 (ref 0.8–1.2)
Prothrombin Time: 13.3 seconds (ref 11.4–15.2)

## 2020-03-13 LAB — HEPARIN LEVEL (UNFRACTIONATED): Heparin Unfractionated: 0.29 IU/mL — ABNORMAL LOW (ref 0.30–0.70)

## 2020-03-13 LAB — CBG MONITORING, ED: Glucose-Capillary: 172 mg/dL — ABNORMAL HIGH (ref 70–99)

## 2020-03-13 LAB — PHOSPHORUS: Phosphorus: 2.5 mg/dL (ref 2.5–4.6)

## 2020-03-13 LAB — TROPONIN I (HIGH SENSITIVITY)
Troponin I (High Sensitivity): 10152 ng/L (ref <18)
Troponin I (High Sensitivity): 12400 ng/L (ref <18)

## 2020-03-13 LAB — APTT: aPTT: 68 seconds — ABNORMAL HIGH (ref 24–36)

## 2020-03-13 SURGERY — LEFT HEART CATH AND CORONARY ANGIOGRAPHY
Anesthesia: Moderate Sedation

## 2020-03-13 MED ORDER — HEPARIN (PORCINE) IN NACL 1000-0.9 UT/500ML-% IV SOLN
INTRAVENOUS | Status: AC
  Filled 2020-03-13: qty 1000

## 2020-03-13 MED ORDER — FENTANYL CITRATE (PF) 100 MCG/2ML IJ SOLN
INTRAMUSCULAR | Status: DC | PRN
  Administered 2020-03-13: 25 ug via INTRAVENOUS

## 2020-03-13 MED ORDER — FENTANYL CITRATE (PF) 100 MCG/2ML IJ SOLN
INTRAMUSCULAR | Status: AC
  Filled 2020-03-13: qty 2

## 2020-03-13 MED ORDER — LABETALOL HCL 5 MG/ML IV SOLN: 10.0000 mg | INTRAVENOUS | Status: AC | PRN

## 2020-03-13 MED ORDER — TICAGRELOR 90 MG PO TABS
90.0000 mg | ORAL_TABLET | Freq: Two times a day (BID) | ORAL | Status: DC
  Administered 2020-03-13 – 2020-03-14 (×2): 90 mg via ORAL
  Filled 2020-03-13 (×2): qty 1

## 2020-03-13 MED ORDER — SODIUM CHLORIDE 0.9% FLUSH: 3.0000 mL | INTRAVENOUS | Status: DC | PRN

## 2020-03-13 MED ORDER — SODIUM CHLORIDE 0.9 % IV SOLN
INTRAVENOUS | Status: AC | PRN
  Administered 2020-03-13: 250 mL via INTRAVENOUS

## 2020-03-13 MED ORDER — NITROGLYCERIN 1 MG/10 ML FOR IR/CATH LAB
INTRA_ARTERIAL | Status: AC
  Filled 2020-03-13: qty 10

## 2020-03-13 MED ORDER — LIDOCAINE HCL (PF) 1 % IJ SOLN
INTRAMUSCULAR | Status: AC
  Filled 2020-03-13: qty 30

## 2020-03-13 MED ORDER — MIDAZOLAM HCL 2 MG/2ML IJ SOLN
INTRAMUSCULAR | Status: AC
  Filled 2020-03-13: qty 2

## 2020-03-13 MED ORDER — CARVEDILOL 3.125 MG PO TABS
3.1250 mg | ORAL_TABLET | Freq: Two times a day (BID) | ORAL | Status: DC
  Administered 2020-03-13 – 2020-03-14 (×3): 3.125 mg via ORAL
  Filled 2020-03-13 (×3): qty 1

## 2020-03-13 MED ORDER — INSULIN ASPART 100 UNIT/ML ~~LOC~~ SOLN
0.0000 [IU] | SUBCUTANEOUS | Status: DC
  Administered 2020-03-13: 3 [IU] via SUBCUTANEOUS
  Administered 2020-03-13: 8 [IU] via SUBCUTANEOUS
  Administered 2020-03-13: 3 [IU] via SUBCUTANEOUS
  Administered 2020-03-13 – 2020-03-14 (×2): 5 [IU] via SUBCUTANEOUS
  Administered 2020-03-14: 3 [IU] via SUBCUTANEOUS
  Administered 2020-03-14: 5 [IU] via SUBCUTANEOUS
  Filled 2020-03-13 (×8): qty 1

## 2020-03-13 MED ORDER — ACETAMINOPHEN 325 MG PO TABS
650.0000 mg | ORAL_TABLET | Freq: Four times a day (QID) | ORAL | Status: DC | PRN
  Administered 2020-03-13: 650 mg via ORAL

## 2020-03-13 MED ORDER — SODIUM CHLORIDE 0.9 % IV SOLN: INTRAVENOUS | Status: AC

## 2020-03-13 MED ORDER — SODIUM CHLORIDE 0.9% FLUSH
3.0000 mL | Freq: Two times a day (BID) | INTRAVENOUS | Status: DC
  Administered 2020-03-13: 3 mL via INTRAVENOUS

## 2020-03-13 MED ORDER — ASPIRIN EC 81 MG PO TBEC
81.0000 mg | DELAYED_RELEASE_TABLET | Freq: Every day | ORAL | Status: DC
  Administered 2020-03-13 – 2020-03-14 (×2): 81 mg via ORAL
  Filled 2020-03-13 (×2): qty 1

## 2020-03-13 MED ORDER — PRAVASTATIN SODIUM 20 MG PO TABS: 20.0000 mg | ORAL_TABLET | Freq: Every day | ORAL | Status: DC

## 2020-03-13 MED ORDER — TICAGRELOR 90 MG PO TABS
ORAL_TABLET | ORAL | Status: DC | PRN
  Administered 2020-03-13: 180 mg via ORAL

## 2020-03-13 MED ORDER — NITROGLYCERIN 1 MG/10 ML FOR IR/CATH LAB
INTRA_ARTERIAL | Status: DC | PRN
  Administered 2020-03-13 (×2): 200 ug

## 2020-03-13 MED ORDER — VERAPAMIL HCL 2.5 MG/ML IV SOLN
INTRAVENOUS | Status: DC | PRN
  Administered 2020-03-13: 2.5 mg via INTRA_ARTERIAL

## 2020-03-13 MED ORDER — MIDAZOLAM HCL 2 MG/2ML IJ SOLN
INTRAMUSCULAR | Status: DC | PRN
  Administered 2020-03-13: 1 mg via INTRAVENOUS

## 2020-03-13 MED ORDER — HEPARIN SODIUM (PORCINE) 1000 UNIT/ML IJ SOLN
INTRAMUSCULAR | Status: AC
  Filled 2020-03-13: qty 1

## 2020-03-13 MED ORDER — ACETAMINOPHEN 325 MG PO TABS
ORAL_TABLET | ORAL | Status: AC
  Filled 2020-03-13: qty 2

## 2020-03-13 MED ORDER — SODIUM CHLORIDE 0.9 % IV SOLN: 250.0000 mL | INTRAVENOUS | Status: DC | PRN

## 2020-03-13 MED ORDER — ATORVASTATIN CALCIUM 80 MG PO TABS
80.0000 mg | ORAL_TABLET | Freq: Every day | ORAL | Status: DC
  Administered 2020-03-13 – 2020-03-14 (×2): 80 mg via ORAL
  Filled 2020-03-13 (×2): qty 1

## 2020-03-13 MED ORDER — SODIUM CHLORIDE 0.9% FLUSH
3.0000 mL | Freq: Two times a day (BID) | INTRAVENOUS | Status: DC
  Administered 2020-03-13 – 2020-03-14 (×3): 3 mL via INTRAVENOUS

## 2020-03-13 MED ORDER — TICAGRELOR 90 MG PO TABS
ORAL_TABLET | ORAL | Status: AC
  Filled 2020-03-13: qty 2

## 2020-03-13 MED ORDER — HYDRALAZINE HCL 20 MG/ML IJ SOLN: 10.0000 mg | INTRAMUSCULAR | Status: AC | PRN

## 2020-03-13 MED ORDER — HEPARIN (PORCINE) IN NACL 1000-0.9 UT/500ML-% IV SOLN
INTRAVENOUS | Status: DC | PRN
  Administered 2020-03-13: 500 mL

## 2020-03-13 MED ORDER — SODIUM CHLORIDE 0.9 % IV SOLN: INTRAVENOUS | Status: DC

## 2020-03-13 MED ORDER — HEPARIN SODIUM (PORCINE) 1000 UNIT/ML IJ SOLN
INTRAMUSCULAR | Status: DC | PRN
  Administered 2020-03-13 (×2): 4000 [IU] via INTRAVENOUS
  Administered 2020-03-13: 2000 [IU] via INTRAVENOUS

## 2020-03-13 MED ORDER — HEPARIN SODIUM (PORCINE) 5000 UNIT/ML IJ SOLN
5000.0000 [IU] | Freq: Three times a day (TID) | INTRAMUSCULAR | Status: DC
  Administered 2020-03-13 – 2020-03-14 (×2): 5000 [IU] via SUBCUTANEOUS
  Filled 2020-03-13 (×2): qty 1

## 2020-03-13 MED ORDER — VERAPAMIL HCL 2.5 MG/ML IV SOLN
INTRAVENOUS | Status: AC
  Filled 2020-03-13: qty 2

## 2020-03-13 MED ORDER — LISINOPRIL 20 MG PO TABS
40.0000 mg | ORAL_TABLET | Freq: Every day | ORAL | Status: DC
  Administered 2020-03-13: 40 mg via ORAL
  Filled 2020-03-13: qty 2

## 2020-03-13 MED ORDER — IOHEXOL 300 MG/ML  SOLN
INTRAMUSCULAR | Status: DC | PRN
  Administered 2020-03-13: 65 mL

## 2020-03-13 SURGICAL SUPPLY — 16 items
BALLN MINITREK RX 2.0X12 (BALLOONS) ×2
BALLN ~~LOC~~ EUPHORA RX 2.75X20 (BALLOONS) ×2
BALLOON MINITREK RX 2.0X12 (BALLOONS) IMPLANT
BALLOON ~~LOC~~ EUPHORA RX 2.75X20 (BALLOONS) IMPLANT
CATH INFINITI 5FR JL4 (CATHETERS) ×1 IMPLANT
CATH INFINITI JR4 5F (CATHETERS) ×1 IMPLANT
CATH VISTA GUIDE 6FR XBLAD3.5 (CATHETERS) ×1 IMPLANT
DEVICE RAD TR BAND REGULAR (VASCULAR PRODUCTS) ×1 IMPLANT
GLIDESHEATH SLEND SS 6F .021 (SHEATH) ×1 IMPLANT
GUIDEWIRE INQWIRE 1.5J.035X260 (WIRE) IMPLANT
INQWIRE 1.5J .035X260CM (WIRE) ×2
KIT ENCORE 26 ADVANTAGE (KITS) ×1 IMPLANT
KIT MANI 3VAL PERCEP (MISCELLANEOUS) ×2 IMPLANT
PACK CARDIAC CATH (CUSTOM PROCEDURE TRAY) ×2 IMPLANT
STENT RESOLUTE ONYX 2.5X34 (Permanent Stent) ×1 IMPLANT
WIRE RUNTHROUGH .014X180CM (WIRE) ×1 IMPLANT

## 2020-03-13 NOTE — Consult Note (Signed)
Cardiology Consultation:   Patient ID: Margaret Payne; [id number]; Jan 19, 1941   Admit date: 03/12/2020 Date of Consult: 03/13/2020  Primary Care Provider: Birdie Sons, MD Primary Cardiologist: New to [facility] - consult by End Primary Electrophysiologist:  None   Patient Profile:   Margaret Payne is a 79 y.o. female with a hx of DM2, CKD stage III, HTN, HLD, and difficulty hearing who is being seen today for the evaluation of NSTEMI at the request of Dr. Josephine Cables.  History of Present Illness:   Margaret Payne has no previously known cardiac history. She has been staying at her daughter's house the past several days while her daughter was away on vacation. In this setting, she has been sleeping on the sofa. She woke up on the morning of 11/22 and just didn't feel well. She was not able to describe this further, and attributed it to sleeping on the sofa. Later in the day, she went outside and after breathing in the cold air, she developed sudden onset of substernal to left-sided chest pain described as pressure and a "weight." This pain was rated a 9/10. There were no associated symptoms. Given persistent symptoms, EMS was called and she was given ASA and SL NTG with improvement in her pain to a 3/10.   Upon the patient's arrival to [facility] they were found to have stable vital signs. EKG showed NSR, 75 bpm, 2 mm st elevation in leads V2 and V3 without reciprocal changes, CXR showed no acute cardiopulmonary pricess. Labs showed an initial HS-Tn of 161 with a delta troponin of 669 and [place]ing to [address] as of this morning. SCr is stable at appears to be at her approximate baseline at 1.6. Case was reviewed by interventional cardiology overnight and did not meet ST elevation MI criteria. She was started on a heparin gtt as IV fluids. Currently, she is chest pain free.   Past Medical History:  Diagnosis Date  . Arthritis   . Diabetes mellitus without complication ([place])    type 2  .  Headache    migrains  . HOH (hard of hearing)   . Hyperlipidemia   . Hypertension     Past Surgical History:  Procedure Laterality Date  . ABDOMINAL HYSTERECTOMY    . BREAST BIOPSY    . CATARACT EXTRACTION W/PHACO Right 10/03/2014   Procedure: CATARACT EXTRACTION PHACO AND INTRAOCULAR LENS PLACEMENT (IOC);  Surgeon: Birder Robson, MD;  Location: [facility] ORS;  Service: Ophthalmology;  Laterality: Right;  [place] 00:57 AP% 16.7 CDE 9.52 Fluid pack lot#[id number]  . INJECTION KNEE       Home Meds: Prior to Admission medications   Medication Sig Start Date End Date Taking? Authorizing Provider  acetaminophen (TYLENOL) 500 MG tablet Take 500 mg by mouth every 6 (six) hours as needed.   Yes [provider]  Cholecalciferol (VITAMIN D3) 5000 units TABS Take 5,000 Units by mouth daily.   Yes [provider]  Dulaglutide (TRULICITY) 1.5 EV/0.3JK SOPN Inject into the skin once a week.    Yes [provider]  insulin lispro protamine-lispro (HUMALOG 75/25 MIX) (75-25) 100 UNIT/ML SUSP injection Inject into the skin 2 (two) times daily with a meal. 40 units in AM and 20 units in PM   Yes [provider]  lisinopril (PRINIVIL,ZESTRIL) 40 MG tablet Take 40 mg by mouth daily.   Yes [provider]  Multiple Vitamin (MULTI-VITAMINS) TABS Take by mouth. One a day vitamin- womens health  Yes [provider]  pravastatin (PRAVACHOL) 20 MG tablet Take 20 mg by mouth daily.  03/20/17 03/12/20 Yes [provider]  glipiZIDE (GLUCOTROL XL) 10 MG 24 hr tablet Take 10 mg by mouth 2 (two) times daily.  Patient not taking: Reported on 11/29/2019    [provider]  insulin aspart protamine- aspart (NOVOLOG MIX 70/30) (70-30) 100 UNIT/ML injection Inject into the skin 2 (two) times daily with a meal.  Patient not taking: Reported on 11/29/2019    [provider]  propranolol (INDERAL) 20 MG tablet Take 0.5 tablets (10 mg total) by mouth 2  (two) times daily. Patient not taking: Reported on 11/29/2019 01/28/19   Birdie Sons, MD    Inpatient Medications: Scheduled Meds: . aspirin EC  81 mg Oral Daily  . atorvastatin  80 mg Oral Daily  . insulin aspart  0-15 Units Subcutaneous Q4H  . lisinopril  40 mg Oral Daily   Continuous Infusions: . heparin 800 Units/hr (03/13/20 0600)   PRN Meds:   Allergies:   Allergies  Allergen Reactions  . Codeine Sulfate Nausea Only    Social History:   Social History   Socioeconomic History  . Marital status: Divorced    Spouse name: Not on file  . Number of children: 1  . Years of education: Not on file  . Highest education level: Associate degree: occupational, Hotel manager, or vocational program  Occupational History  . Occupation: Retired  Tobacco Use  . Smoking status: Never Smoker  . Smokeless tobacco: Never Used  Vaping Use  . Vaping Use: Never used  Substance and Sexual Activity  . Alcohol use: No    Alcohol/week: 0.0 standard drinks  . Drug use: No  . Sexual activity: Not on file  Other Topics Concern  . Not on file  Social History Narrative  . Not on file   Social Determinants of Health   Financial Resource Strain: Low Risk   . Difficulty of Paying Living Expenses: Not hard at all  Food Insecurity: No Food Insecurity  . Worried About Charity fundraiser in the Last Year: Never true  . Ran Out of Food in the Last Year: Never true  Transportation Needs: No Transportation Needs  . Lack of Transportation (Medical): No  . Lack of Transportation (Non-Medical): No  Physical Activity: Inactive  . Days of Exercise per Week: 0 days  . Minutes of Exercise per Session: 0 min  Stress: No Stress Concern Present  . Feeling of Stress : Not at all  Social Connections: Moderately Isolated  . Frequency of Communication with Friends and Family: More than three times a week  . Frequency of Social Gatherings with Friends and Family: More than three times a week  . Attends  Religious Services: Never  . Active Member of Clubs or Organizations: Yes  . Attends Archivist Meetings: More than 4 times per year  . Marital Status: Divorced  Human resources officer Violence: Not At Risk  . Fear of Current or Ex-Partner: No  . Emotionally Abused: No  . Physically Abused: No  . Sexually Abused: No     Family History:   Family History  Problem Relation Age of Onset  . Breast cancer Mother   . Emphysema Father   . Breast cancer Sister 40    ROS:  Review of Systems  Constitutional: Positive for malaise/fatigue. Negative for chills, diaphoresis, fever and weight loss.  HENT: Negative for congestion.   Eyes: Negative for discharge  and redness.  Respiratory: Negative for cough, sputum production, shortness of breath and wheezing.   Cardiovascular: Positive for chest pain. Negative for palpitations, orthopnea, claudication, leg swelling and PND.  Gastrointestinal: Negative for abdominal pain, heartburn, nausea and vomiting.  Musculoskeletal: Negative for falls and myalgias.  Skin: Negative for rash.  Neurological: Positive for weakness. Negative for dizziness, tingling, tremors, sensory change, speech change, focal weakness and loss of consciousness.  Endo/Heme/Allergies: Does not bruise/bleed easily.  Psychiatric/Behavioral: Negative for substance abuse. The patient is not nervous/anxious.   All other systems reviewed and are negative.     Physical Exam/Data:   Vitals:   03/13/20 0200 03/13/20 0230 03/13/20 0330 03/13/20 0438  BP: 121/71 (!) 142/79 137/79 (!) 154/76  Pulse: 78 73 72 83  Resp: (!) 21 17 18 17   Temp:    98.4 F (36.9 C)  TempSrc:    Oral  SpO2: 94% 96% 97% 100%  Weight:    76.3 kg  Height:    5\' 2"  (1.575 m)    Intake/Output Summary (Last 24 hours) at 03/13/2020 0729 Last data filed at 03/13/2020 0600 Gross per 24 hour  Intake 64.87 ml  Output --  Net 64.87 ml   Filed Weights   03/12/20 1755 03/13/20 0438  Weight: 78 kg 76.3  kg   Body mass index is 30.78 kg/m.   Physical Exam: General: Well developed, well nourished, in no acute distress. Head: Normocephalic, atraumatic, sclera non-icteric, no xanthomas, nares without discharge. Hard of hearing.   Neck: Negative for carotid bruits. JVD not elevated. Lungs: Clear bilaterally to auscultation without wheezes, rales, or rhonchi. Breathing is unlabored. Heart: RRR with S1 S2. No murmurs, rubs, or gallops appreciated. Abdomen: Soft, non-tender, non-distended with normoactive bowel sounds. No hepatomegaly. No rebound/guarding. No obvious abdominal masses. Msk:  Strength and tone appear normal for age. Extremities: No clubbing or cyanosis. No edema. Distal pedal pulses are 2+ and equal bilaterally. Neuro: Alert and oriented X 3. No facial asymmetry. No focal deficit. Moves all extremities spontaneously. Psych:  Responds to questions appropriately with a normal affect.   EKG:  The EKG was personally reviewed and demonstrates: NSR, 75 bpm, 2 mm st elevation in leads V2 and V3 without reciprocal changes Telemetry:  Telemetry was personally reviewed and demonstrates: SR  Weights: [employer]   03/12/20 1755 03/13/20 0438  Weight: 78 kg 76.3 kg    Relevant CV Studies: N/A  Laboratory Data:  Chemistry Recent Labs  Lab 03/12/20 1805 03/13/20 0510  NA 134* 137  K 4.1 4.1  CL 105 108  CO2 24 23  GLUCOSE 274* 177*  BUN 27* 21  CREATININE 1.71* 1.60*  CALCIUM 8.3* 8.9  GFRNONAA 30* 33*  ANIONGAP 5 6    Recent Labs  Lab 03/13/20 0510  PROT 6.7  ALBUMIN 3.6  AST 68*  ALT 22  ALKPHOS 74  BILITOT 0.7   Hematology Recent Labs  Lab 03/12/20 1805 03/13/20 0510  WBC 9.9 10.3  RBC 4.47 4.13  HGB 13.4 12.2  HCT 40.7 37.1  MCV 91.1 89.8  MCH 30.0 29.5  MCHC 32.9 32.9  RDW 13.5 13.5  PLT 306 286   Cardiac EnzymesNo results for input(s): TROPONINI in the last 168 hours. No results for input(s): TROPIPOC in the last 168 hours.  BNPNo results  for input(s): BNP, PROBNP in the last 168 hours.  DDimer No results for input(s): DDIMER in the last 168 hours.  Radiology/Studies:  DG Chest 2 View  Result  Date: 03/12/2020 IMPRESSION: No active cardiopulmonary disease. Electronically Signed   By: Virgina Norfolk M.D.   On: 03/12/2020 19:05    Assessment and Plan:   1. NSTEMI: -Currently chest pain free -EKG with anterior st elevation, not meeting stemi criteria  -HS-Tn currently trending to 12400, continue to cycle until peak -ASA -Heparin gtt -NPO -LHC today with Dr. Saunders Revel -Echo -Stop pravastatin, start Lipitor -Start Coreg -Check lipid panel A1c pending -Risks and benefits of cardiac catheterization have been discussed with the patient including risks of bleeding, bruising, infection, kidney damage, stroke, heart attack, urgent need for bypass surgery, injury to a limb, and death. The patient understands these risks and is willing to proceed with the procedure. All questions have been answered and concerns listened to  2. CKD stage III: -Pre and post cath IV hydration  -Limit contrast  -Monitor post cath  3. HTN: -Mildly elevated this morning -Start Coreg  -PTA lisinopril   4. HLD: -Check lipid panel -Change pravastatin to Lipitor  5. DM2: -SSI per IM -A1c pending    For questions or updates, please contact [place] Please consult [email or web address] for contact info under Cardiology/STEMI.   Signed, Christell Faith, PA-C [place] Pager: [phone] 03/13/2020, 7:29 AM

## 2020-03-13 NOTE — Progress Notes (Signed)
Dr. Saunders Revel at bedside, speaking with pt. And her daughter Margaret Payne re: cath procedure, benefits, risks, blood work. Both verbalized understanding of conversation.

## 2020-03-13 NOTE — Consult Note (Signed)
ANTICOAGULATION CONSULT NOTE  Pharmacy Consult for Heparin Infusion Indication: chest pain/ACS  Patient Measurements: Heparin Dosing Weight: 67.2 kg  Labs: Recent Labs    03/12/20 1805 03/12/20 1805 03/12/20 1945 03/12/20 2102 03/13/20 0333 03/13/20 0510  HGB 13.4  --   --   --   --  12.2  HCT 40.7  --   --   --   --  37.1  PLT 306  --   --   --   --  286  APTT  --   --   --  27  --  68*  LABPROT  --   --   --  13.0  --  13.3  INR  --   --   --  1.0  --  1.1  HEPARINUNFRC  --   --   --   --   --  0.29*  CREATININE 1.71*  --   --   --   --  1.60*  TROPONINIHS 161*   < > 669*  --  10,152* 12,400*   < > = values in this interval not displayed.    Estimated Creatinine Clearance: 27.7 mL/min (A) (by C-G formula based on SCr of 1.6 mg/dL (H)).  Medical History: Past Medical History:  Diagnosis Date  . Arthritis   . Diabetes mellitus without complication ([place])    type 2  . Headache    migrains  . HOH (hard of hearing)   . Hyperlipidemia   . Hypertension    Medications:  No anticoagulation prior to admission per my chart review  Assessment: Patient is a 79 y/o F with medical history as above who presented to the ED 11/22 with chest pain. Troponin 161 >> 669. Pharmacy has been consulted to initiate heparin infusion for ACS.  Baseline CBC within normal limits. Baseline aPTT and PT-INR pending.   Goal of Therapy:  Heparin level 0.3-0.7 units/ml Monitor platelets by anticoagulation protocol: Yes   Plan:  --Heparin 4000 unit IV bolus x 1 followed by continuous infusion at 800 units/hr --Heparin level 8 hours after initiation of infusion --Daily CBC per protocol while on heparin drip  11/23 @ 0510 HL 0.29, barely subtherapeutic.  Will increase Heparin to 1000 units/hr and recheck HL in 8 hours  [place] Crane, Nakeita Styles A 03/13/2020,6:57 AM

## 2020-03-13 NOTE — Progress Notes (Signed)
*  PRELIMINARY RESULTS* Echocardiogram 2D Echocardiogram has been performed.  Sherrie Sport 03/13/2020, 1:36 PM

## 2020-03-13 NOTE — H&P (View-Only) (Signed)
Cardiology Consultation:   Patient ID: Margaret Payne; [id number]; 1940-12-02   Admit date: 03/12/2020 Date of Consult: 03/13/2020  Primary Care Provider: Birdie Sons, MD Primary Cardiologist: New to [facility] - consult by End Primary Electrophysiologist:  None   Patient Profile:   Margaret Payne is a 79 y.o. female with a hx of DM2, CKD stage III, HTN, HLD, and difficulty hearing who is being seen today for the evaluation of NSTEMI at the request of Dr. Josephine Cables.  History of Present Illness:   Margaret Payne has no previously known cardiac history. She has been staying at her daughter's house the past several days while her daughter was away on vacation. In this setting, she has been sleeping on the sofa. She woke up on the morning of 11/22 and just didn't feel well. She was not able to describe this further, and attributed it to sleeping on the sofa. Later in the day, she went outside and after breathing in the cold air, she developed sudden onset of substernal to left-sided chest pain described as pressure and a "weight." This pain was rated a 9/10. There were no associated symptoms. Given persistent symptoms, EMS was called and she was given ASA and SL NTG with improvement in her pain to a 3/10.   Upon the patient's arrival to [facility] they were found to have stable vital signs. EKG showed NSR, 75 bpm, 2 mm st elevation in leads V2 and V3 without reciprocal changes, CXR showed no acute cardiopulmonary pricess. Labs showed an initial HS-Tn of 161 with a delta troponin of 669 and [place]ing to [address] as of this morning. SCr is stable at appears to be at her approximate baseline at 1.6. Case was reviewed by interventional cardiology overnight and did not meet ST elevation MI criteria. She was started on a heparin gtt as IV fluids. Currently, she is chest pain free.   Past Medical History:  Diagnosis Date  . Arthritis   . Diabetes mellitus without complication ([place])    type 2  .  Headache    migrains  . HOH (hard of hearing)   . Hyperlipidemia   . Hypertension     Past Surgical History:  Procedure Laterality Date  . ABDOMINAL HYSTERECTOMY    . BREAST BIOPSY    . CATARACT EXTRACTION W/PHACO Right 10/03/2014   Procedure: CATARACT EXTRACTION PHACO AND INTRAOCULAR LENS PLACEMENT (IOC);  Surgeon: Birder Robson, MD;  Location: [facility] ORS;  Service: Ophthalmology;  Laterality: Right;  [place] 00:57 AP% 16.7 CDE 9.52 Fluid pack lot#[id number]  . INJECTION KNEE       Home Meds: Prior to Admission medications   Medication Sig Start Date End Date Taking? Authorizing Provider  acetaminophen (TYLENOL) 500 MG tablet Take 500 mg by mouth every 6 (six) hours as needed.   Yes [provider]  Cholecalciferol (VITAMIN D3) 5000 units TABS Take 5,000 Units by mouth daily.   Yes [provider]  Dulaglutide (TRULICITY) 1.5 IZ/1.2WP SOPN Inject into the skin once a week.    Yes [provider]  insulin lispro protamine-lispro (HUMALOG 75/25 MIX) (75-25) 100 UNIT/ML SUSP injection Inject into the skin 2 (two) times daily with a meal. 40 units in AM and 20 units in PM   Yes [provider]  lisinopril (PRINIVIL,ZESTRIL) 40 MG tablet Take 40 mg by mouth daily.   Yes [provider]  Multiple Vitamin (MULTI-VITAMINS) TABS Take by mouth. One a day vitamin- womens health  Yes [provider]  pravastatin (PRAVACHOL) 20 MG tablet Take 20 mg by mouth daily.  03/20/17 03/12/20 Yes [provider]  glipiZIDE (GLUCOTROL XL) 10 MG 24 hr tablet Take 10 mg by mouth 2 (two) times daily.  Patient not taking: Reported on 11/29/2019    [provider]  insulin aspart protamine- aspart (NOVOLOG MIX 70/30) (70-30) 100 UNIT/ML injection Inject into the skin 2 (two) times daily with a meal.  Patient not taking: Reported on 11/29/2019    [provider]  propranolol (INDERAL) 20 MG tablet Take 0.5 tablets (10 mg total) by mouth 2  (two) times daily. Patient not taking: Reported on 11/29/2019 01/28/19   Birdie Sons, MD    Inpatient Medications: Scheduled Meds: . aspirin EC  81 mg Oral Daily  . atorvastatin  80 mg Oral Daily  . insulin aspart  0-15 Units Subcutaneous Q4H  . lisinopril  40 mg Oral Daily   Continuous Infusions: . heparin 800 Units/hr (03/13/20 0600)   PRN Meds:   Allergies:   Allergies  Allergen Reactions  . Codeine Sulfate Nausea Only    Social History:   Social History   Socioeconomic History  . Marital status: Divorced    Spouse name: Not on file  . Number of children: 1  . Years of education: Not on file  . Highest education level: Associate degree: occupational, Hotel manager, or vocational program  Occupational History  . Occupation: Retired  Tobacco Use  . Smoking status: Never Smoker  . Smokeless tobacco: Never Used  Vaping Use  . Vaping Use: Never used  Substance and Sexual Activity  . Alcohol use: No    Alcohol/week: 0.0 standard drinks  . Drug use: No  . Sexual activity: Not on file  Other Topics Concern  . Not on file  Social History Narrative  . Not on file   Social Determinants of Health   Financial Resource Strain: Low Risk   . Difficulty of Paying Living Expenses: Not hard at all  Food Insecurity: No Food Insecurity  . Worried About Charity fundraiser in the Last Year: Never true  . Ran Out of Food in the Last Year: Never true  Transportation Needs: No Transportation Needs  . Lack of Transportation (Medical): No  . Lack of Transportation (Non-Medical): No  Physical Activity: Inactive  . Days of Exercise per Week: 0 days  . Minutes of Exercise per Session: 0 min  Stress: No Stress Concern Present  . Feeling of Stress : Not at all  Social Connections: Moderately Isolated  . Frequency of Communication with Friends and Family: More than three times a week  . Frequency of Social Gatherings with Friends and Family: More than three times a week  . Attends  Religious Services: Never  . Active Member of Clubs or Organizations: Yes  . Attends Archivist Meetings: More than 4 times per year  . Marital Status: Divorced  Human resources officer Violence: Not At Risk  . Fear of Current or Ex-Partner: No  . Emotionally Abused: No  . Physically Abused: No  . Sexually Abused: No     Family History:   Family History  Problem Relation Age of Onset  . Breast cancer Mother   . Emphysema Father   . Breast cancer Sister 40    ROS:  Review of Systems  Constitutional: Positive for malaise/fatigue. Negative for chills, diaphoresis, fever and weight loss.  HENT: Negative for congestion.   Eyes: Negative for discharge  and redness.  Respiratory: Negative for cough, sputum production, shortness of breath and wheezing.   Cardiovascular: Positive for chest pain. Negative for palpitations, orthopnea, claudication, leg swelling and PND.  Gastrointestinal: Negative for abdominal pain, heartburn, nausea and vomiting.  Musculoskeletal: Negative for falls and myalgias.  Skin: Negative for rash.  Neurological: Positive for weakness. Negative for dizziness, tingling, tremors, sensory change, speech change, focal weakness and loss of consciousness.  Endo/Heme/Allergies: Does not bruise/bleed easily.  Psychiatric/Behavioral: Negative for substance abuse. The patient is not nervous/anxious.   All other systems reviewed and are negative.     Physical Exam/Data:   Vitals:   03/13/20 0200 03/13/20 0230 03/13/20 0330 03/13/20 0438  BP: 121/71 (!) 142/79 137/79 (!) 154/76  Pulse: 78 73 72 83  Resp: (!) 21 17 18 17   Temp:    98.4 F (36.9 C)  TempSrc:    Oral  SpO2: 94% 96% 97% 100%  Weight:    76.3 kg  Height:    5\' 2"  (1.575 m)    Intake/Output Summary (Last 24 hours) at 03/13/2020 0729 Last data filed at 03/13/2020 0600 Gross per 24 hour  Intake 64.87 ml  Output --  Net 64.87 ml   Filed Weights   03/12/20 1755 03/13/20 0438  Weight: 78 kg 76.3  kg   Body mass index is 30.78 kg/m.   Physical Exam: General: Well developed, well nourished, in no acute distress. Head: Normocephalic, atraumatic, sclera non-icteric, no xanthomas, nares without discharge. Hard of hearing.   Neck: Negative for carotid bruits. JVD not elevated. Lungs: Clear bilaterally to auscultation without wheezes, rales, or rhonchi. Breathing is unlabored. Heart: RRR with S1 S2. No murmurs, rubs, or gallops appreciated. Abdomen: Soft, non-tender, non-distended with normoactive bowel sounds. No hepatomegaly. No rebound/guarding. No obvious abdominal masses. Msk:  Strength and tone appear normal for age. Extremities: No clubbing or cyanosis. No edema. Distal pedal pulses are 2+ and equal bilaterally. Neuro: Alert and oriented X 3. No facial asymmetry. No focal deficit. Moves all extremities spontaneously. Psych:  Responds to questions appropriately with a normal affect.   EKG:  The EKG was personally reviewed and demonstrates: NSR, 75 bpm, 2 mm st elevation in leads V2 and V3 without reciprocal changes Telemetry:  Telemetry was personally reviewed and demonstrates: SR  Weights: [employer]   03/12/20 1755 03/13/20 0438  Weight: 78 kg 76.3 kg    Relevant CV Studies: N/A  Laboratory Data:  Chemistry Recent Labs  Lab 03/12/20 1805 03/13/20 0510  NA 134* 137  K 4.1 4.1  CL 105 108  CO2 24 23  GLUCOSE 274* 177*  BUN 27* 21  CREATININE 1.71* 1.60*  CALCIUM 8.3* 8.9  GFRNONAA 30* 33*  ANIONGAP 5 6    Recent Labs  Lab 03/13/20 0510  PROT 6.7  ALBUMIN 3.6  AST 68*  ALT 22  ALKPHOS 74  BILITOT 0.7   Hematology Recent Labs  Lab 03/12/20 1805 03/13/20 0510  WBC 9.9 10.3  RBC 4.47 4.13  HGB 13.4 12.2  HCT 40.7 37.1  MCV 91.1 89.8  MCH 30.0 29.5  MCHC 32.9 32.9  RDW 13.5 13.5  PLT 306 286   Cardiac EnzymesNo results for input(s): TROPONINI in the last 168 hours. No results for input(s): TROPIPOC in the last 168 hours.  BNPNo results  for input(s): BNP, PROBNP in the last 168 hours.  DDimer No results for input(s): DDIMER in the last 168 hours.  Radiology/Studies:  DG Chest 2 View  Result  Date: 03/12/2020 IMPRESSION: No active cardiopulmonary disease. Electronically Signed   By: Virgina Norfolk M.D.   On: 03/12/2020 19:05    Assessment and Plan:   1. NSTEMI: -Currently chest pain free -EKG with anterior st elevation, not meeting stemi criteria  -HS-Tn currently trending to 12400, continue to cycle until peak -ASA -Heparin gtt -NPO -LHC today with Dr. Saunders Revel -Echo -Stop pravastatin, start Lipitor -Start Coreg -Check lipid panel A1c pending -Risks and benefits of cardiac catheterization have been discussed with the patient including risks of bleeding, bruising, infection, kidney damage, stroke, heart attack, urgent need for bypass surgery, injury to a limb, and death. The patient understands these risks and is willing to proceed with the procedure. All questions have been answered and concerns listened to  2. CKD stage III: -Pre and post cath IV hydration  -Limit contrast  -Monitor post cath  3. HTN: -Mildly elevated this morning -Start Coreg  -PTA lisinopril   4. HLD: -Check lipid panel -Change pravastatin to Lipitor  5. DM2: -SSI per IM -A1c pending    For questions or updates, please contact [place] Please consult [email or web address] for contact info under Cardiology/STEMI.   Signed, Christell Faith, PA-C [place] Pager: [phone] 03/13/2020, 7:29 AM

## 2020-03-13 NOTE — Progress Notes (Signed)
Dr. Saunders Revel at pt. Bedside in specials recovery to check on pt.: informed of TR Band bleed earlier and current status : No active bleed, hematoma, edema, ecchymosis. Currently deflationg TR band according to protocol.

## 2020-03-13 NOTE — Progress Notes (Signed)
PROGRESS NOTE    Margaret Payne  BTD:[id number] DOB: 1941-01-29 DOA: 03/12/2020 PCP: Birdie Sons, MD   Brief Narrative: Taken from H&P. Margaret Payne is a 79 y.o. female with medical history significant for type 2 diabetes mellitus, hypertension, hyperlipidemia, hard of hearing, CKD stage III and obesity who presents to the emergency department due to chest pain that started earlier today. Received 4 baby aspirin and sublingual nitroglycerin from EMS, resulted in improvement of chest pain. EKG without any acute changes but troponin rising, went up to 12 400. More consistent with NSTEMI. Patient was taken for left heart catheterization by Dr. Saunders Revel today and found to have 98 to 99% stenosis of mid LAD. A successful PCI was placed. She will need dual antiplatelets with aspirin and ticagrelor for 1 year per cardiology. Patient remained stable since then.  Subjective: Patient was seen this morning after catheterization in post cath holding area. Denies any chest pain or shortness of breath. She had a stent placed in LAD. Tolerated the procedure well.  Assessment & Plan:   Principal Problem:   Chest pain Active Problems:   Hypertension   Obesity   Hard of hearing   CKD (chronic kidney disease), stage III (HCC)   Hyperglycemia due to diabetes mellitus (HCC)   Hyperlipidemia   Non-ST elevation (NSTEMI) myocardial infarction [facility])  NSTEMI. Patient's presentation and elevated troponin consistent with NSTEMI. Left heart catheterization with stenosis at mid LAD level which was successfully stented. -Continue on dual antiplatelet with aspirin and ticagrelor for 54-month. -She will remain in the hospital for another 24-hour. -Cardiac rehab after discharge. -Continue with statin.  Hypertension. Blood pressure within goal today. -Continue home dose of lisinopril.  Hyperlipidemia. -Change Pravachol with Lipitor.  Type 2 diabetes mellitus. CBG elevated with A1c of 7.7. -Continue with  sliding scale.  CKD stage IIIb. Creatinine at baseline. -Monitor renal function as patient received contrast for cardiac catheterization. -Avoid nephrotoxins.  Obesity (BMI 31.46) Patient was counseled diet and lifestyle modification  Objective: Vitals:   03/13/20 1315 03/13/20 1330 03/13/20 1345 03/13/20 1400  BP: (!) 90/44 (!) 97/55 (!) 98/54 (!) 100/56  Pulse: 76 77 78 80  Resp: 18 20 18 18   Temp:      TempSrc:      SpO2: 98% 97% 97% 96%  Weight:      Height:        Intake/Output Summary (Last 24 hours) at 03/13/2020 1436 Last data filed at 03/13/2020 0600 Gross per 24 hour  Intake 64.87 ml  Output --  Net 64.87 ml   Filed Weights   03/12/20 1755 03/13/20 0438 03/13/20 0905  Weight: 78 kg 76.3 kg 76.3 kg    Examination:  General exam: Appears calm and comfortable  Respiratory system: Clear to auscultation. Respiratory effort normal. Cardiovascular system: S1 & S2 heard, RRR. No JVD, murmurs, rubs, gallops or clicks. Gastrointestinal system: Soft, nontender, nondistended, bowel sounds positive. Central nervous system: Alert and oriented. No focal neurological deficits.Symmetric 5 x 5 power. Extremities: No edema, no cyanosis, pulses intact and symmetrical. Psychiatry: Judgement and insight appear normal. Mood & affect appropriate.    DVT prophylaxis: Heparin Code Status: Full Family Communication: Daughter was updated by cardiology. Disposition Plan:  Status is: Inpatient  Remains inpatient appropriate because:Inpatient level of care appropriate due to severity of illness   Dispo: The patient is from: Home              Anticipated d/c is to: Home  Anticipated d/c date is: 1 day              Patient currently is not medically stable to d/c.   Consultants:   Cardiology  Procedures:  Antimicrobials:   Data Reviewed: I have personally reviewed following labs and imaging studies  CBC: Recent Labs  Lab 03/12/20 1805 03/13/20 0510  WBC  9.9 10.3  HGB 13.4 12.2  HCT 40.7 37.1  MCV 91.1 89.8  PLT 306 725   Basic Metabolic Panel: Recent Labs  Lab 03/12/20 1805 03/13/20 0510  NA 134* 137  K 4.1 4.1  CL 105 108  CO2 24 23  GLUCOSE 274* 177*  BUN 27* 21  CREATININE 1.71* 1.60*  CALCIUM 8.3* 8.9  MG  --  1.9  PHOS  --  2.5   GFR: Estimated Creatinine Clearance: 27.7 mL/min (A) (by C-G formula based on SCr of 1.6 mg/dL (H)). Liver Function Tests: Recent Labs  Lab 03/13/20 0510  AST 68*  ALT 22  ALKPHOS 74  BILITOT 0.7  PROT 6.7  ALBUMIN 3.6   No results for input(s): LIPASE, AMYLASE in the last 168 hours. No results for input(s): AMMONIA in the last 168 hours. Coagulation Profile: Recent Labs  Lab 03/12/20 2102 03/13/20 0510  INR 1.0 1.1   Cardiac Enzymes: No results for input(s): CKTOTAL, CKMB, CKMBINDEX, TROPONINI in the last 168 hours. BNP (last 3 results) No results for input(s): PROBNP in the last 8760 hours. HbA1C: Recent Labs    03/13/20 0333  HGBA1C 7.7*   CBG: Recent Labs  Lab 03/13/20 0336 03/13/20 0741 03/13/20 1129  GLUCAP 172* 158* 158*   Lipid Profile: Recent Labs    03/13/20 0510  CHOL 170  HDL 39*  LDLCALC 91  TRIG 198*  CHOLHDL 4.4   Thyroid Function Tests: No results for input(s): TSH, T4TOTAL, FREET4, T3FREE, THYROIDAB in the last 72 hours. Anemia Panel: No results for input(s): VITAMINB12, FOLATE, FERRITIN, TIBC, IRON, RETICCTPCT in the last 72 hours. Sepsis Labs: No results for input(s): PROCALCITON, LATICACIDVEN in the last 168 hours.  Recent Results (from the past 240 hour(s))  Resp Panel by RT-PCR (Flu A&B, Covid) Nasopharyngeal Swab     Status: None   Collection Time: 03/12/20  9:02 PM   Specimen: Nasopharyngeal Swab; Nasopharyngeal(NP) swabs in vial transport medium  Result Value Ref Range Status   SARS Coronavirus 2 by RT PCR NEGATIVE NEGATIVE Final    Comment: (NOTE) SARS-CoV-2 target nucleic acids are NOT DETECTED.  The SARS-CoV-2 RNA is  generally detectable in upper respiratory specimens during the acute phase of infection. The lowest concentration of SARS-CoV-2 viral copies this assay can detect is 138 copies/mL. A negative result does not preclude SARS-Cov-2 infection and should not be used as the sole basis for treatment or other patient management decisions. A negative result may occur with  improper specimen collection/handling, submission of specimen other than nasopharyngeal swab, presence of viral mutation(s) within the areas targeted by this assay, and inadequate number of viral copies(<138 copies/mL). A negative result must be combined with clinical observations, patient history, and epidemiological information. The expected result is Negative.  Fact Sheet for Patients:  EntrepreneurPulse.com.au  Fact Sheet for Healthcare Providers:  IncredibleEmployment.be  This test is no t yet approved or cleared by the [place] FDA and  has been authorized for detection and/or diagnosis of SARS-CoV-2 by FDA under an Emergency Use Authorization (EUA). This EUA will remain  in effect (meaning this test can be used)  for the duration of the COVID-19 declaration under Section 564(b)(1) of the Act, 21 U.S.C.section 360bbb-3(b)(1), unless the authorization is terminated  or revoked sooner.       Influenza A by PCR NEGATIVE NEGATIVE Final   Influenza B by PCR NEGATIVE NEGATIVE Final    Comment: (NOTE) The Xpert Xpress SARS-CoV-2/FLU/RSV plus assay is intended as an aid in the diagnosis of influenza from Nasopharyngeal swab specimens and should not be used as a sole basis for treatment. Nasal washings and aspirates are unacceptable for Xpert Xpress SARS-CoV-2/FLU/RSV testing.  Fact Sheet for Patients: EntrepreneurPulse.com.au  Fact Sheet for Healthcare Providers: IncredibleEmployment.be  This test is not yet approved or cleared by the [place] FDA and has been authorized for detection and/or diagnosis of SARS-CoV-2 by FDA under an Emergency Use Authorization (EUA). This EUA will remain in effect (meaning this test can be used) for the duration of the COVID-19 declaration under Section 564(b)(1) of the Act, 21 U.S.C. section 360bbb-3(b)(1), unless the authorization is terminated or revoked.  Performed at [facility], [address], [place], [place] [address]      Radiology Studies: DG Chest 2 View  Result Date: 03/12/2020 CLINICAL DATA:  Chest pain. EXAM: CHEST - 2 VIEW COMPARISON:  None. FINDINGS: The heart size and mediastinal contours are within normal limits. There is mild calcification of the aortic arch. Both lungs are clear. Mild dextroscoliosis of the thoracic spine is seen. IMPRESSION: No active cardiopulmonary disease. Electronically Signed   By: Virgina Norfolk M.D.   On: 03/12/2020 19:05   CARDIAC CATHETERIZATION  Result Date: 03/13/2020 Conclusions: 1. Severe single-vessel coronary artery disease with sequential 99% and 70% mid LAD stenoses.  Mild to moderate, nonobstructive CAD also noted in the ostial LAD, ramus intermedius, distal LCx, and proximal/mid RCA. 2. Normal left ventricular filling pressure. 3. Successful PCI to sequential mid LAD stenoses using resolute Onyx 2.5 x 34 mm drug-eluting stent with 0% residual stenosis and TIMI-3 flow. Recommendations: 1. Dual antiplatelet therapy with aspirin and ticagrelor for at least 12 months. 2. Aggressive secondary prevention of coronary artery disease. 3. Gentle post catheterization hydration. 4. Follow-up transthoracic echocardiogram with escalation of goal-directed medical therapy for likely reduced LVEF (left ventriculogram not performed in the setting of CKD). Nelva Bush, MD [facility]   ECHOCARDIOGRAM COMPLETE  Result Date: 03/13/2020    ECHOCARDIOGRAM REPORT   Patient Name:   LISETH WANN Date of Exam: 03/13/2020 Medical Rec #:   [id number]       Height:       62.0 in Accession #:    [id number]      Weight:       168.2 lb Date of Birth:  1940/09/10        BSA:          1.776 m Patient Age:    23 years        BP:           90/44 mmHg Patient Gender: F               HR:           76 bpm. Exam Location:  [facility] Procedure: 2D Echo, Color Doppler, Cardiac Doppler and Strain Analysis Indications:     Chest pain 786.50  History:         Patient has no prior history of Echocardiogram examinations.                  Risk  Factors:Hypertension, Diabetes and Dyslipidemia.  Sonographer:     Sherrie Sport RDCS (AE) Referring Phys:  [id number] CHRISTOPHER END Diagnosing Phys: Nelva Bush MD  Sonographer Comments: Global longitudinal strain was attempted. IMPRESSIONS  1. Left ventricular ejection fraction, by estimation, is 40 to 45%. The left ventricle has mildly decreased function. The left ventricle demonstrates regional wall motion abnormalities (see scoring diagram/findings for description). Left ventricular diastolic parameters are consistent with Grade I diastolic dysfunction (impaired relaxation).  2. Right ventricular systolic function is normal. The right ventricular size is normal.  3. The mitral valve was not well visualized. No evidence of mitral valve regurgitation. No evidence of mitral stenosis.  4. The aortic valve was not well visualized. Aortic valve regurgitation is not visualized. No aortic stenosis is present. FINDINGS  Left Ventricle: Left ventricular wall thickness is not well assessed. Left ventricular ejection fraction, by estimation, is 40 to 45%. The left ventricle has mildly decreased function. The left ventricle demonstrates regional wall motion abnormalities. The left ventricular internal cavity size was normal in size. Left ventricular diastolic parameters are consistent with Grade I diastolic dysfunction (impaired relaxation).  LV Wall Scoring: The mid and distal anterior wall, mid and distal anterior septum, mid inferoseptal segment,  apical inferior segment, and apex are hypokinetic. The entire lateral wall, inferior wall, basal anteroseptal segment, basal anterior segment, and basal inferoseptal segment are normal. Right Ventricle: The right ventricular size is normal. Right vetricular wall thickness was not well visualized. Right ventricular systolic function is normal. Left Atrium: Left atrial size was normal in size. Right Atrium: Right atrial size was not well visualized. Pericardium: The pericardium was not well visualized. Mitral Valve: The mitral valve was not well visualized. No evidence of mitral valve regurgitation. No evidence of mitral valve stenosis. Tricuspid Valve: The tricuspid valve is not well visualized. Tricuspid valve regurgitation is trivial. Aortic Valve: The aortic valve was not well visualized. Aortic valve regurgitation is not visualized. No aortic stenosis is present. Aortic valve mean gradient measures 3.5 mmHg. Aortic valve peak gradient measures 6.5 mmHg. Aortic valve area, by VTI measures 3.49 cm. Pulmonic Valve: The pulmonic valve was not well visualized. Aorta: The aortic root was not well visualized. IAS/Shunts: The interatrial septum was not well visualized.  LEFT VENTRICLE PLAX 2D LVIDd:         2.59 cm     Diastology LVIDs:         1.99 cm     LV e' medial:    6.53 cm/s LV PW:         1.10 cm     LV E/e' medial:  11.1 LV IVS:        1.14 cm     LV e' lateral:   7.51 cm/s LVOT diam:     1.90 cm     LV E/e' lateral: 9.7 LV SV:         86 LV SV Index:   48 LVOT Area:     2.84 cm  LV Volumes (MOD) LV vol d, MOD A2C: 39.9 ml LV vol d, MOD A4C: 59.4 ml LV vol s, MOD A2C: 30.1 ml LV vol s, MOD A4C: 23.9 ml LV SV MOD A2C:     9.8 ml LV SV MOD A4C:     59.4 ml LV SV MOD BP:      21.8 ml RIGHT VENTRICLE RV S prime:     14.80 cm/s TAPSE (M-mode): 4.0 cm LEFT ATRIUM  Index      RIGHT ATRIUM          Index LA diam:        3.00 cm 1.69 cm/m RA Area:     9.06 cm LA Vol (A2C):   14.0 ml 7.88 ml/m RA Volume:    18.30 ml 10.30 ml/m LA Vol (A4C):   15.1 ml 8.50 ml/m LA Biplane Vol: 15.3 ml 8.61 ml/m  AORTIC VALVE AV Area (Vmax):    2.57 cm AV Area (Vmean):   2.61 cm AV Area (VTI):     3.49 cm AV Vmax:           127.00 cm/s AV Vmean:          85.750 cm/s AV VTI:            0.245 m AV Peak Grad:      6.5 mmHg AV Mean Grad:      3.5 mmHg LVOT Vmax:         115.00 cm/s LVOT Vmean:        78.800 cm/s LVOT VTI:          0.302 m LVOT/AV VTI ratio: 1.23 MITRAL VALVE                TRICUSPID VALVE MV Area (PHT): 2.82 cm     TR Peak grad:   11.6 mmHg MV Decel Time: 269 msec     TR Vmax:        170.00 cm/s MV E velocity: 72.80 cm/s MV A velocity: 129.00 cm/s  SHUNTS MV E/A ratio:  0.56         Systemic VTI:  0.30 m                             Systemic Diam: 1.90 cm Nelva Bush MD Electronically signed by Nelva Bush MD Signature Date/Time: 03/13/2020/2:04:37 PM    Final     Scheduled Meds: . acetaminophen      . [MAR Hold] aspirin EC  81 mg Oral Daily  . [MAR Hold] atorvastatin  80 mg Oral Daily  . [MAR Hold] carvedilol  3.125 mg Oral BID WC  . heparin  5,000 Units Subcutaneous Q8H  . [MAR Hold] insulin aspart  0-15 Units Subcutaneous Q4H  . [MAR Hold] lisinopril  40 mg Oral Daily  . sodium chloride flush  3 mL Intravenous Q12H  . ticagrelor  90 mg Oral BID   Continuous Infusions: . sodium chloride    . sodium chloride       LOS: 1 day   Time spent: 35 minutes.  Lorella Nimrod, MD [facility]  If 7PM-7AM, please contact night-coverage [email or web address]  03/13/2020, 2:36 PM   This record has been created using Systems analyst. Errors have been sought and corrected,but may not always be located. Such creation errors do not reflect on the standard of care.

## 2020-03-13 NOTE — Progress Notes (Signed)
Dr. Reesa Chew at bedside to see pt.. Dr. Saunders Revel spoke with pt. Daughter [name] re: PCI. 12 lead EKG done. TR band site to left radial without complications at site.

## 2020-03-13 NOTE — Interval H&P Note (Signed)
History and Physical Interval Note:  03/13/2020 10:18 AM  Margaret Payne  has presented today for surgery, with the diagnosis of non ST segment myocardial infarction.  The various methods of treatment have been discussed with the patient and family. After consideration of risks, benefits and other options for treatment, the patient has consented to  Procedure(s): LEFT HEART CATH AND CORONARY ANGIOGRAPHY (N/A) as a surgical intervention.  The patient's history has been reviewed, patient examined, no change in status, stable for surgery.  I have reviewed the patient's chart and labs.  Questions were answered to the patient's satisfaction.    Cath Lab Visit (complete for each Cath Lab visit)  Clinical Evaluation Leading to the Procedure:   ACS: Yes.    Non-ACS:  N/A  Hildagarde Holleran

## 2020-03-14 ENCOUNTER — Encounter: Payer: Self-pay | Admitting: Internal Medicine

## 2020-03-14 DIAGNOSIS — R072 Precordial pain: Secondary | ICD-10-CM

## 2020-03-14 DIAGNOSIS — R778 Other specified abnormalities of plasma proteins: Secondary | ICD-10-CM

## 2020-03-14 LAB — BASIC METABOLIC PANEL
Anion gap: 10 (ref 5–15)
BUN: 21 mg/dL (ref 8–23)
CO2: 23 mmol/L (ref 22–32)
Calcium: 8.8 mg/dL — ABNORMAL LOW (ref 8.9–10.3)
Chloride: 107 mmol/L (ref 98–111)
Creatinine, Ser: 1.55 mg/dL — ABNORMAL HIGH (ref 0.44–1.00)
GFR, Estimated: 34 mL/min — ABNORMAL LOW (ref >60)
Glucose, Bld: 205 mg/dL — ABNORMAL HIGH (ref 70–99)
Potassium: 3.9 mmol/L (ref 3.5–5.1)
Sodium: 140 mmol/L (ref 135–145)

## 2020-03-14 LAB — CBC
HCT: 34.1 % — ABNORMAL LOW (ref 36.0–46.0)
Hemoglobin: 11.4 g/dL — ABNORMAL LOW (ref 12.0–15.0)
MCH: 30 pg (ref 26.0–34.0)
MCHC: 33.4 g/dL (ref 30.0–36.0)
MCV: 89.7 fL (ref 80.0–100.0)
Platelets: 246 10*3/uL (ref 150–400)
RBC: 3.8 MIL/uL — ABNORMAL LOW (ref 3.87–5.11)
RDW: 13.7 % (ref 11.5–15.5)
WBC: 9.6 10*3/uL (ref 4.0–10.5)
nRBC: 0 % (ref 0.0–0.2)

## 2020-03-14 LAB — GLUCOSE, CAPILLARY
Glucose-Capillary: 174 mg/dL — ABNORMAL HIGH (ref 70–99)
Glucose-Capillary: 212 mg/dL — ABNORMAL HIGH (ref 70–99)
Glucose-Capillary: 214 mg/dL — ABNORMAL HIGH (ref 70–99)

## 2020-03-14 MED ORDER — LISINOPRIL 20 MG PO TABS
20.0000 mg | ORAL_TABLET | Freq: Every day | ORAL | Status: DC
  Administered 2020-03-14: 20 mg via ORAL
  Filled 2020-03-14: qty 1

## 2020-03-14 MED ORDER — ASPIRIN 81 MG PO TBEC: 81.0000 mg | DELAYED_RELEASE_TABLET | Freq: Every day | ORAL | 0 refills | Status: AC

## 2020-03-14 MED ORDER — CARVEDILOL 3.125 MG PO TABS: 3.1250 mg | ORAL_TABLET | Freq: Two times a day (BID) | ORAL | 0 refills | Status: DC

## 2020-03-14 MED ORDER — TICAGRELOR 90 MG PO TABS: 90.0000 mg | ORAL_TABLET | Freq: Two times a day (BID) | ORAL | 0 refills | Status: DC

## 2020-03-14 MED ORDER — LISINOPRIL 20 MG PO TABS: 20.0000 mg | ORAL_TABLET | Freq: Every day | ORAL | 0 refills | Status: DC

## 2020-03-14 MED ORDER — ATORVASTATIN CALCIUM 80 MG PO TABS: 80.0000 mg | ORAL_TABLET | Freq: Every day | ORAL | 0 refills | Status: DC

## 2020-03-14 NOTE — Progress Notes (Signed)
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 Normal RVSF.  Wall motion abnormalities of mid and distal anterior wall, mid and distal anterior septum, mid inferoseptal segment, apical inferior segment and apex are hypokinetic.   She presented to the hospital by way of EMS  with complaints of chest pain which had started earlier in the day and she attributed to breathing in cold air.   Co morbidities:  Coronary artery disease with DES to LAD CKD stage III Hypertension Hyperlipidemia Diabetes type 2   Medications:  ASA 81 mg daily Lipitor 80 mg daily Coreg 3.125 mg BID Lisinopril 20 mg daily  Cardiology plans to escalate GDMT as an outpatient due to soft blood pressure readings.  She is currently on two of the four pillars of GDMT.    Labs:  Sodium 140, potassium 3.7, BUN 21 (21), creatinine 1.55 (1.60)  Weight 75.6 (76.3) KG Intake 600 ml Output 2050 ml BMI 30.47 BP 95/64 pulse 84   Assessment:  General- she is awake and alert, sitting on the edge of the bed visiting with daughter.  HEENT- HOH, no JVD, pupils equal, non icterus. Edentulous.   Cardiac- heart tones are regular, no murmur or rubs.  Abdomen soft non tender  Musculoskeletal- no lower extremity edema,  Psych- pleasant and appropriate  Neuro- speech is clear, moves all extremities with out  Difficulty.   Initial Visit with patient.  She states she is being discharged home. Discussed decline in the function of her heart, she voices understanding.  Went on the discuss the importance of not using salt and monitoring sodium intake not to exceed 2000 mg in a day.  Discussed food types to avoid and ones to use.  Also discussed fluid intake, limiting to 2000 ml in a day.(64 ounces)  Also discussed the heart failure clinic and that she would follow up with Darylene Price NP, has appointment for December 7 at 1:30 pm,  explained that she is a very good resource for  questions or if she  felt she was having problems, could always call her.  Discussed the importance of daily weight and recording.  When to call doctor.  She was given living with heart failure teaching booklet and zone magnet, also given heart failure cookbook.  She and daughter had no further questions.   Pricilla Riffle RN, CHFN

## 2020-03-14 NOTE — Discharge Summary (Signed)
Physician Discharge Summary  Margaret Payne UXN:[id number] DOB: July 10, 1940 DOA: 03/12/2020  PCP: Birdie Sons, MD  Admit date: 03/12/2020 Discharge date: 03/14/2020  Admitted From: Home Disposition:  Home  Recommendations for Outpatient Follow-up:  1. Follow up with PCP in 1-2 weeks 2. Follow up with Cardiology as scheduled  Discharge Condition:Stable CODE STATUS:Full Diet recommendation: Heart healthy   Brief/Interim Summary: 79 y.o.femalewith medical history significant fortype 2 diabetes mellitus, hypertension, hyperlipidemia, hard of hearing, CKD stage III and obesity who presents to the emergency department due to chest pain that started earlier today. Received 4 baby aspirin and sublingual nitroglycerin from EMS, resulted in improvement of chest pain. EKG without any acute changes but troponin rising, went up to 12 400. More consistent with NSTEMI. Patient was taken for left heart catheterization by Dr. Saunders Revel today and found to have 30 to 99% stenosis of mid LAD. A successful PCI was placed. She will need dual antiplatelets with aspirin and ticagrelor for 1 year per cardiology. Patient remained stable since then.  Discharge Diagnoses:  Principal Problem:   Chest pain Active Problems:   Hypertension   Obesity   Hard of hearing   CKD (chronic kidney disease), stage III (HCC)   Hyperglycemia due to diabetes mellitus (HCC)   Hyperlipidemia   Non-ST elevation (NSTEMI) myocardial infarction [facility])   NSTEMI.  -Patient's presentation and elevated troponin consistent with NSTEMI. Left heart catheterization with stenosis at mid LAD level which was successfully stented. -Continue on dual antiplatelet with aspirin and ticagrelor for 57-month. -Cardiac rehab after discharge. -Continue with statin and dual anti-platelet  Hypertension.  -Blood pressure within goal today. -Continue home dose of lisinopril.  Hyperlipidemia. -Change Pravachol with Lipitor.  Type 2 diabetes  mellitus. CBG elevated with A1c of 7.7. -Continued with sliding scale while in hospital  CKD stage IIIb. Creatinine at baseline. -Monitor renal function as patient received contrast for cardiac catheterization. -Remained stable  Obesity (BMI 31.46) Patient was counseled diet and lifestyle modification   Discharge Instructions  Discharge Instructions    AMB Referral to Cardiac Rehabilitation - Phase II   Complete by: As directed    Diagnosis:  Coronary Stents NSTEMI     After initial evaluation and assessments completed: Virtual Based Care may be provided alone or in conjunction with Phase 2 Cardiac Rehab based on patient barriers.: Yes     Allergies as of 03/14/2020      Reactions   Codeine Sulfate Nausea Only      Medication List    STOP taking these medications   glipiZIDE 10 MG 24 hr tablet Commonly known as: GLUCOTROL XL   insulin aspart protamine- aspart (70-30) 100 UNIT/ML injection Commonly known as: NOVOLOG MIX 70/30   pravastatin 20 MG tablet Commonly known as: PRAVACHOL   propranolol 20 MG tablet Commonly known as: INDERAL     TAKE these medications   acetaminophen 500 MG tablet Commonly known as: TYLENOL Take 500 mg by mouth every 6 (six) hours as needed.   aspirin 81 MG EC tablet Take 1 tablet (81 mg total) by mouth daily. Swallow whole. Start taking on: March 15, 2020   atorvastatin 80 MG tablet Commonly known as: LIPITOR Take 1 tablet (80 mg total) by mouth daily. Start taking on: March 15, 2020   carvedilol 3.125 MG tablet Commonly known as: COREG Take 1 tablet (3.125 mg total) by mouth 2 (two) times daily with a meal.   insulin lispro protamine-lispro (75-25) 100 UNIT/ML Susp injection Commonly known  as: HUMALOG 75/25 MIX Inject into the skin 2 (two) times daily with a meal. 40 units in AM and 20 units in PM   lisinopril 20 MG tablet Commonly known as: ZESTRIL Take 1 tablet (20 mg total) by mouth daily. Start taking on:  March 15, 2020 What changed:   medication strength  how much to take   Multi-Vitamins Tabs Take by mouth. One a day vitamin- womens health   ticagrelor 90 MG Tabs tablet Commonly known as: BRILINTA Take 1 tablet (90 mg total) by mouth 2 (two) times daily.   Trulicity 1.5 HA/1.9FX Sopn Generic drug: Dulaglutide Inject into the skin once a week.   Vitamin D3 125 MCG (5000 UT) Tabs Take 5,000 Units by mouth daily.       Follow-up Information    Birdie Sons, MD. Schedule an appointment as soon as possible for a visit in 2 week(s).   Specialty: Family Medicine Contact information: [address] [place] [address] [phone]        Follow up with Cardiology as scheduled Follow up.              Allergies  Allergen Reactions  . Codeine Sulfate Nausea Only    Consultations:  Cardiology  Procedures/Studies: DG Chest 2 View  Result Date: 03/12/2020 CLINICAL DATA:  Chest pain. EXAM: CHEST - 2 VIEW COMPARISON:  None. FINDINGS: The heart size and mediastinal contours are within normal limits. There is mild calcification of the aortic arch. Both lungs are clear. Mild dextroscoliosis of the thoracic spine is seen. IMPRESSION: No active cardiopulmonary disease. Electronically Signed   By: Virgina Norfolk M.D.   On: 03/12/2020 19:05   CARDIAC CATHETERIZATION  Result Date: 03/13/2020 Conclusions: 1. Severe single-vessel coronary artery disease with sequential 99% and 70% mid LAD stenoses.  Mild to moderate, nonobstructive CAD also noted in the ostial LAD, ramus intermedius, distal LCx, and proximal/mid RCA. 2. Normal left ventricular filling pressure. 3. Successful PCI to sequential mid LAD stenoses using resolute Onyx 2.5 x 34 mm drug-eluting stent with 0% residual stenosis and TIMI-3 flow. Recommendations: 1. Dual antiplatelet therapy with aspirin and ticagrelor for at least 12 months. 2. Aggressive secondary prevention of coronary artery disease.  3. Gentle post catheterization hydration. 4. Follow-up transthoracic echocardiogram with escalation of goal-directed medical therapy for likely reduced LVEF (left ventriculogram not performed in the setting of CKD). Nelva Bush, MD [facility]   ECHOCARDIOGRAM COMPLETE  Result Date: 03/13/2020    ECHOCARDIOGRAM REPORT   Patient Name:   MARGERET STACHNIK Date of Exam: 03/13/2020 Medical Rec #:  [id number]       Height:       62.0 in Accession #:    [id number]      Weight:       168.2 lb Date of Birth:  November 05, 1940        BSA:          1.776 m Patient Age:    83 years        BP:           90/44 mmHg Patient Gender: F               HR:           76 bpm. Exam Location:  [facility] Procedure: 2D Echo, Color Doppler, Cardiac Doppler and Strain Analysis Indications:     Chest pain 786.50  History:         Patient has no prior  history of Echocardiogram examinations.                  Risk Factors:Hypertension, Diabetes and Dyslipidemia.  Sonographer:     Sherrie Sport RDCS (AE) Referring Phys:  [id number] Diagnosing Phys: Nelva Bush MD  Sonographer Comments: Global longitudinal strain was attempted. IMPRESSIONS  1. Left ventricular ejection fraction, by estimation, is 40 to 45%. The left ventricle has mildly decreased function. The left ventricle demonstrates regional wall motion abnormalities (see scoring diagram/findings for description). Left ventricular diastolic parameters are consistent with Grade I diastolic dysfunction (impaired relaxation).  2. Right ventricular systolic function is normal. The right ventricular size is normal.  3. The mitral valve was not well visualized. No evidence of mitral valve regurgitation. No evidence of mitral stenosis.  4. The aortic valve was not well visualized. Aortic valve regurgitation is not visualized. No aortic stenosis is present. FINDINGS  Left Ventricle: Left ventricular wall thickness is not well assessed. Left ventricular ejection fraction, by estimation, is 40  to 45%. The left ventricle has mildly decreased function. The left ventricle demonstrates regional wall motion abnormalities. The left ventricular internal cavity size was normal in size. Left ventricular diastolic parameters are consistent with Grade I diastolic dysfunction (impaired relaxation).  LV Wall Scoring: The mid and distal anterior wall, mid and distal anterior septum, mid inferoseptal segment, apical inferior segment, and apex are hypokinetic. The entire lateral wall, inferior wall, basal anteroseptal segment, basal anterior segment, and basal inferoseptal segment are normal. Right Ventricle: The right ventricular size is normal. Right vetricular wall thickness was not well visualized. Right ventricular systolic function is normal. Left Atrium: Left atrial size was normal in size. Right Atrium: Right atrial size was not well visualized. Pericardium: The pericardium was not well visualized. Mitral Valve: The mitral valve was not well visualized. No evidence of mitral valve regurgitation. No evidence of mitral valve stenosis. Tricuspid Valve: The tricuspid valve is not well visualized. Tricuspid valve regurgitation is trivial. Aortic Valve: The aortic valve was not well visualized. Aortic valve regurgitation is not visualized. No aortic stenosis is present. Aortic valve mean gradient measures 3.5 mmHg. Aortic valve peak gradient measures 6.5 mmHg. Aortic valve area, by VTI measures 3.49 cm. Pulmonic Valve: The pulmonic valve was not well visualized. Aorta: The aortic root was not well visualized. IAS/Shunts: The interatrial septum was not well visualized.  LEFT VENTRICLE PLAX 2D LVIDd:         2.59 cm     Diastology LVIDs:         1.99 cm     LV e' medial:    6.53 cm/s LV PW:         1.10 cm     LV E/e' medial:  11.1 LV IVS:        1.14 cm     LV e' lateral:   7.51 cm/s LVOT diam:     1.90 cm     LV E/e' lateral: 9.7 LV SV:         86 LV SV Index:   48 LVOT Area:     2.84 cm  LV Volumes (MOD) LV vol d,  MOD A2C: 39.9 ml LV vol d, MOD A4C: 59.4 ml LV vol s, MOD A2C: 30.1 ml LV vol s, MOD A4C: 23.9 ml LV SV MOD A2C:     9.8 ml LV SV MOD A4C:     59.4 ml LV SV MOD BP:      21.8 ml  RIGHT VENTRICLE RV S prime:     14.80 cm/s TAPSE (M-mode): 4.0 cm LEFT ATRIUM             Index      RIGHT ATRIUM          Index LA diam:        3.00 cm 1.69 cm/m RA Area:     9.06 cm LA Vol (A2C):   14.0 ml 7.88 ml/m RA Volume:   18.30 ml 10.30 ml/m LA Vol (A4C):   15.1 ml 8.50 ml/m LA Biplane Vol: 15.3 ml 8.61 ml/m  AORTIC VALVE AV Area (Vmax):    2.57 cm AV Area (Vmean):   2.61 cm AV Area (VTI):     3.49 cm AV Vmax:           127.00 cm/s AV Vmean:          85.750 cm/s AV VTI:            0.245 m AV Peak Grad:      6.5 mmHg AV Mean Grad:      3.5 mmHg LVOT Vmax:         115.00 cm/s LVOT Vmean:        78.800 cm/s LVOT VTI:          0.302 m LVOT/AV VTI ratio: 1.23 MITRAL VALVE                TRICUSPID VALVE MV Area (PHT): 2.82 cm     TR Peak grad:   11.6 mmHg MV Decel Time: 269 msec     TR Vmax:        170.00 cm/s MV E velocity: 72.80 cm/s MV A velocity: 129.00 cm/s  SHUNTS MV E/A ratio:  0.56         Systemic VTI:  0.30 m                             Systemic Diam: 1.90 cm Nelva Bush MD Electronically signed by Nelva Bush MD Signature Date/Time: 03/13/2020/2:04:37 PM    Final      Subjective: Eager to go home today  Discharge Exam: Vitals:   03/14/20 0746 03/14/20 1151  BP: 95/64 (!) 109/59  Pulse: 84 74  Resp: 19   Temp: 98.2 F (36.8 C) 97.9 F (36.6 C)  SpO2: 97% 98%   Vitals:   03/13/20 1900 03/14/20 0405 03/14/20 0746 03/14/20 1151  BP: (!) 100/52 (!) 99/53 95/64 (!) 109/59  Pulse: 78 90 84 74  Resp: 19 19 19    Temp: 98.5 F (36.9 C) 98.3 F (36.8 C) 98.2 F (36.8 C) 97.9 F (36.6 C)  TempSrc: Oral Oral Oral   SpO2: 100% 97% 97% 98%  Weight:  75.6 kg    Height:        General: Pt is alert, awake, not in acute distress Cardiovascular: RRR, S1/S2 +, no rubs, no gallops Respiratory:  CTA bilaterally, no wheezing, no rhonchi Abdominal: Soft, NT, ND, bowel sounds + Extremities: no edema, no cyanosis   The results of significant diagnostics from this hospitalization (including imaging, microbiology, ancillary and laboratory) are listed below for reference.     Microbiology: Recent Results (from the past 240 hour(s))  Resp Panel by RT-PCR (Flu A&B, Covid) Nasopharyngeal Swab     Status: None   Collection Time: 03/12/20  9:02 PM   Specimen: Nasopharyngeal Swab; Nasopharyngeal(NP) swabs in vial transport medium  Result Value  Ref Range Status   SARS Coronavirus 2 by RT PCR NEGATIVE NEGATIVE Final    Comment: (NOTE) SARS-CoV-2 target nucleic acids are NOT DETECTED.  The SARS-CoV-2 RNA is generally detectable in upper respiratory specimens during the acute phase of infection. The lowest concentration of SARS-CoV-2 viral copies this assay can detect is 138 copies/mL. A negative result does not preclude SARS-Cov-2 infection and should not be used as the sole basis for treatment or other patient management decisions. A negative result may occur with  improper specimen collection/handling, submission of specimen other than nasopharyngeal swab, presence of viral mutation(s) within the areas targeted by this assay, and inadequate number of viral copies(<138 copies/mL). A negative result must be combined with clinical observations, patient history, and epidemiological information. The expected result is Negative.  Fact Sheet for Patients:  EntrepreneurPulse.com.au  Fact Sheet for Healthcare Providers:  IncredibleEmployment.be  This test is no t yet approved or cleared by the [place] FDA and  has been authorized for detection and/or diagnosis of SARS-CoV-2 by FDA under an Emergency Use Authorization (EUA). This EUA will remain  in effect (meaning this test can be used) for the duration of the COVID-19 declaration under Section  564(b)(1) of the Act, 21 U.S.C.section 360bbb-3(b)(1), unless the authorization is terminated  or revoked sooner.       Influenza A by PCR NEGATIVE NEGATIVE Final   Influenza B by PCR NEGATIVE NEGATIVE Final    Comment: (NOTE) The Xpert Xpress SARS-CoV-2/FLU/RSV plus assay is intended as an aid in the diagnosis of influenza from Nasopharyngeal swab specimens and should not be used as a sole basis for treatment. Nasal washings and aspirates are unacceptable for Xpert Xpress SARS-CoV-2/FLU/RSV testing.  Fact Sheet for Patients: EntrepreneurPulse.com.au  Fact Sheet for Healthcare Providers: IncredibleEmployment.be  This test is not yet approved or cleared by the [place] FDA and has been authorized for detection and/or diagnosis of SARS-CoV-2 by FDA under an Emergency Use Authorization (EUA). This EUA will remain in effect (meaning this test can be used) for the duration of the COVID-19 declaration under Section 564(b)(1) of the Act, 21 U.S.C. section 360bbb-3(b)(1), unless the authorization is terminated or revoked.  Performed at [facility], [address] [place], [place] [address]      Labs: BNP (last 3 results) No results for input(s): BNP in the last 8760 hours. Basic Metabolic Panel: Recent Labs  Lab 03/12/20 1805 03/13/20 0510 03/14/20 0445  NA 134* 137 140  K 4.1 4.1 3.9  CL 105 108 107  CO2 24 23 23   GLUCOSE 274* 177* 205*  BUN 27* 21 21  CREATININE 1.71* 1.60* 1.55*  CALCIUM 8.3* 8.9 8.8*  MG  --  1.9  --   PHOS  --  2.5  --    Liver Function Tests: Recent Labs  Lab 03/13/20 0510  AST 68*  ALT 22  ALKPHOS 74  BILITOT 0.7  PROT 6.7  ALBUMIN 3.6   No results for input(s): LIPASE, AMYLASE in the last 168 hours. No results for input(s): AMMONIA in the last 168 hours. CBC: Recent Labs  Lab 03/12/20 1805 03/13/20 0510 03/14/20 0445  WBC 9.9 10.3 9.6  HGB 13.4 12.2 11.4*  HCT 40.7 37.1  34.1*  MCV 91.1 89.8 89.7  PLT 306 286 246   Cardiac Enzymes: No results for input(s): CKTOTAL, CKMB, CKMBINDEX, TROPONINI in the last 168 hours. BNP: Invalid input(s): POCBNP CBG: Recent Labs  Lab 03/13/20 2006 03/13/20 2357 03/14/20 0409 03/14/20 5631  03/14/20 1150  GLUCAP 234* 148* 174* 212* 214*   D-Dimer No results for input(s): DDIMER in the last 72 hours. Hgb A1c Recent Labs    03/13/20 0333  HGBA1C 7.7*   Lipid Profile Recent Labs    03/13/20 0510  CHOL 170  HDL 39*  LDLCALC 91  TRIG 198*  CHOLHDL 4.4   Thyroid function studies No results for input(s): TSH, T4TOTAL, T3FREE, THYROIDAB in the last 72 hours.  Invalid input(s): FREET3 Anemia work up No results for input(s): VITAMINB12, FOLATE, FERRITIN, TIBC, IRON, RETICCTPCT in the last 72 hours. Urinalysis No results found for: COLORURINE, APPEARANCEUR, LABSPEC, [place], GLUCOSEU, [place], [place], KETONESUR, PROTEINUR, UROBILINOGEN, NITRITE, LEUKOCYTESUR Sepsis Labs Invalid input(s): PROCALCITONIN,  WBC,  LACTICIDVEN Microbiology Recent Results (from the past 240 hour(s))  Resp Panel by RT-PCR (Flu A&B, Covid) Nasopharyngeal Swab     Status: None   Collection Time: 03/12/20  9:02 PM   Specimen: Nasopharyngeal Swab; Nasopharyngeal(NP) swabs in vial transport medium  Result Value Ref Range Status   SARS Coronavirus 2 by RT PCR NEGATIVE NEGATIVE Final    Comment: (NOTE) SARS-CoV-2 target nucleic acids are NOT DETECTED.  The SARS-CoV-2 RNA is generally detectable in upper respiratory specimens during the acute phase of infection. The lowest concentration of SARS-CoV-2 viral copies this assay can detect is 138 copies/mL. A negative result does not preclude SARS-Cov-2 infection and should not be used as the sole basis for treatment or other patient management decisions. A negative result may occur with  improper specimen collection/handling, submission of specimen other than nasopharyngeal swab,  presence of viral mutation(s) within the areas targeted by this assay, and inadequate number of viral copies(<138 copies/mL). A negative result must be combined with clinical observations, patient history, and epidemiological information. The expected result is Negative.  Fact Sheet for Patients:  EntrepreneurPulse.com.au  Fact Sheet for Healthcare Providers:  IncredibleEmployment.be  This test is no t yet approved or cleared by the [place] FDA and  has been authorized for detection and/or diagnosis of SARS-CoV-2 by FDA under an Emergency Use Authorization (EUA). This EUA will remain  in effect (meaning this test can be used) for the duration of the COVID-19 declaration under Section 564(b)(1) of the Act, 21 U.S.C.section 360bbb-3(b)(1), unless the authorization is terminated  or revoked sooner.       Influenza A by PCR NEGATIVE NEGATIVE Final   Influenza B by PCR NEGATIVE NEGATIVE Final    Comment: (NOTE) The Xpert Xpress SARS-CoV-2/FLU/RSV plus assay is intended as an aid in the diagnosis of influenza from Nasopharyngeal swab specimens and should not be used as a sole basis for treatment. Nasal washings and aspirates are unacceptable for Xpert Xpress SARS-CoV-2/FLU/RSV testing.  Fact Sheet for Patients: EntrepreneurPulse.com.au  Fact Sheet for Healthcare Providers: IncredibleEmployment.be  This test is not yet approved or cleared by the [place] FDA and has been authorized for detection and/or diagnosis of SARS-CoV-2 by FDA under an Emergency Use Authorization (EUA). This EUA will remain in effect (meaning this test can be used) for the duration of the COVID-19 declaration under Section 564(b)(1) of the Act, 21 U.S.C. section 360bbb-3(b)(1), unless the authorization is terminated or revoked.  Performed at [facility], [address] [place], [place] [address]    Time spent:  30 min  SIGNED:   Marylu Lund, MD  [facility] 03/14/2020, 12:20 PM  If 7PM-7AM, please contact night-coverage

## 2020-03-14 NOTE — Progress Notes (Signed)
Progress Note  Patient Name: Margaret Payne Date of Encounter: 03/14/2020  Primary Cardiologist: New to [facility] - consult by End  Subjective   Status post [facility] 03/13/2020 that showed severe 1-vessel CAD with sequential 99% and 70% mid LAD stenoses with successful PCI/DES to the mid LAD. There was residual mild to moderate nonobstructive disease noted in the ostial LAD, ramus, distal LCx, and proximal/mid RCA. Echo showed an EF of 40-45%, hypokinesis of the mid and distal anterior wall, mid and distal anterior septum, mid inferoseptal segment, apical inferior segment, and apex, Gr1DD, normal RVSF and ventricular cavity size, and no significant valvular abnormalities. Post cath labs are stable. BP on the soft side.   No chest pain, dyspnea, palpitations, dizziness, presyncope, or syncope. She has ambulated without issues. Tolerating medications.   Inpatient Medications    Scheduled Meds: . aspirin EC  81 mg Oral Daily  . atorvastatin  80 mg Oral Daily  . carvedilol  3.125 mg Oral BID WC  . heparin  5,000 Units Subcutaneous Q8H  . insulin aspart  0-15 Units Subcutaneous Q4H  . lisinopril  40 mg Oral Daily  . sodium chloride flush  3 mL Intravenous Q12H  . ticagrelor  90 mg Oral BID   Continuous Infusions: . sodium chloride     PRN Meds: sodium chloride, acetaminophen, sodium chloride flush   Vital Signs    Vitals:   03/13/20 1545 03/13/20 1624 03/13/20 1900 03/14/20 0405  BP:  105/67 (!) 100/52 (!) 99/53  Pulse: 76 81 78 90  Resp:  17 19 19   Temp:  98.4 F (36.9 C) 98.5 F (36.9 C) 98.3 F (36.8 C)  TempSrc:  Oral Oral Oral  SpO2: 96% 97% 100% 97%  Weight:    75.6 kg  Height:        Intake/Output Summary (Last 24 hours) at 03/14/2020 0735 Last data filed at 03/14/2020 0407 Gross per 24 hour  Intake 600 ml  Output 2050 ml  Net -1450 ml   Filed Weights   03/13/20 0438 03/13/20 0905 03/14/20 0405  Weight: 76.3 kg 76.3 kg 75.6 kg    Telemetry    SR 60s to 80s  bpm - Personally Reviewed  ECG    NSR, 78 bpm, inferior st elevation, anterior TWI - Personally Reviewed  Physical Exam   GEN: No acute distress.   Neck: No JVD. Cardiac: RRR, no murmurs, rubs, or gallops. Left radial cardiac cath site is well healing without active bleeding, bruising, swelling, warmth, erythema, or TTP. Radial pulse 2+.  Respiratory: Clear to auscultation bilaterally.  GI: Soft, nontender, non-distended.   MS: No edema; No deformity. Neuro:  Alert and oriented x 3; Nonfocal.  Psych: Normal affect.  Labs    Chemistry Recent Labs  Lab 03/12/20 1805 03/13/20 0510 03/14/20 0445  NA 134* 137 140  K 4.1 4.1 3.9  CL 105 108 107  CO2 24 23 23   GLUCOSE 274* 177* 205*  BUN 27* 21 21  CREATININE 1.71* 1.60* 1.55*  CALCIUM 8.3* 8.9 8.8*  PROT  --  6.7  --   ALBUMIN  --  3.6  --   AST  --  68*  --   ALT  --  22  --   ALKPHOS  --  74  --   BILITOT  --  0.7  --   GFRNONAA 30* 33* 34*  ANIONGAP 5 6 10      Hematology Recent Labs  Lab 03/12/20 1805 03/13/20  0510 03/14/20 0445  WBC 9.9 10.3 9.6  RBC 4.47 4.13 3.80*  HGB 13.4 12.2 11.4*  HCT 40.7 37.1 34.1*  MCV 91.1 89.8 89.7  MCH 30.0 29.5 30.0  MCHC 32.9 32.9 33.4  RDW 13.5 13.5 13.7  PLT 306 286 246    Cardiac EnzymesNo results for input(s): TROPONINI in the last 168 hours. No results for input(s): TROPIPOC in the last 168 hours.   BNPNo results for input(s): BNP, PROBNP in the last 168 hours.   DDimer No results for input(s): DDIMER in the last 168 hours.   Radiology    DG Chest 2 View  Result Date: 03/12/2020 IMPRESSION: No active cardiopulmonary disease. Electronically Signed   By: Virgina Norfolk M.D.   On: 03/12/2020 19:05   Cardiac Studies   2D echo 03/13/2020: 1. Left ventricular ejection fraction, by estimation, is 40 to 45%. The  left ventricle has mildly decreased function. The left ventricle  demonstrates regional wall motion abnormalities (see scoring  diagram/findings for  description). Left ventricular  diastolic parameters are consistent with Grade I diastolic dysfunction  (impaired relaxation).  2. Right ventricular systolic function is normal. The right ventricular  size is normal.  3. The mitral valve was not well visualized. No evidence of mitral valve  regurgitation. No evidence of mitral stenosis.  4. The aortic valve was not well visualized. Aortic valve regurgitation  is not visualized. No aortic stenosis is present. __________  [facility] 03/13/2020: Conclusions: 1. Severe single-vessel coronary artery disease with sequential 99% and 70% mid LAD stenoses.  Mild to moderate, nonobstructive CAD also noted in the ostial LAD, ramus intermedius, distal LCx, and proximal/mid RCA. 2. Normal left ventricular filling pressure. 3. Successful PCI to sequential mid LAD stenoses using resolute Onyx 2.5 x 34 mm drug-eluting stent with 0% residual stenosis and TIMI-3 flow.  Recommendations: 1. Dual antiplatelet therapy with aspirin and ticagrelor for at least 12 months. 2. Aggressive secondary prevention of coronary artery disease. 3. Gentle post catheterization hydration. 4. Follow-up transthoracic echocardiogram with escalation of goal-directed medical therapy for likely reduced LVEF (left ventriculogram not performed in the setting of CKD).   Patient Profile     79 y.o. female with history of DM2, CKD stage III, HTN, HLD, and difficulty hearing who is being seen today for the evaluation of NSTEMI at the request of Dr. Josephine Cables.  Assessment & Plan    1. NSTEMI: -Status post PCI/DES to the mid LAD as outlined above -Currently chest pain free -Continue DAPT with ASA and Brilinta without interruption for at least the next 12 months -Consult care manager for Brilinta card -Echo as above -Now on Lipitor in place of PTA pravastatin  -Coreg -Lipid panel and A1c as below -Post cath instructions -Cardiac rehab  2. 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 CKD stage III: -Stable post cath   4. HTN: -Soft this morning -Continue recently started low dose Coreg  -Decrease lisinopril to 20 mg   5. HLD: -LDL 91 this admission with goal now < 70 -PTA pravastatin has been changed to Lipitor -Follow up fasting lipid panel and LFT in the office in ~ 8 weeks  6. DM2: -  SSI per IM -A1c 7.7    -Dispo: -Once she has been staffed by Dr. Garen Lah, she is ok for discharge today from our perspective on current cardiac medications. I will arrange for follow up in our office in 1 week.   For questions or updates, please contact [place] Please consult [email or web address] for contact info under Cardiology/STEMI.    Signed, Christell Faith, PA-C [place] Pager: [phone] 03/14/2020, 7:35 AM

## 2020-03-14 NOTE — Care Management Important Message (Signed)
Important Message  Patient Details  Name: Margaret Payne MRN: [id number] Date of Birth: 1940-11-29   Medicare Important Message Given:  N/A - LOS <3 / Initial given by admissions  Initial Medicare IM reviewed with patient's daughter, Constance Goltz, by Meredith Mody, Patient Access Associate on 03/13/2020 at 9:23am.    Dannette Barbara 03/14/2020, 8:25 AM

## 2020-03-14 NOTE — Plan of Care (Signed)
  Problem: Education: Goal: Knowledge of General Education information will improve Description: Including pain rating scale, medication(s)/side effects and non-pharmacologic comfort measures Outcome: Adequate for Discharge   Problem: Health Behavior/Discharge Planning: Goal: Ability to manage health-related needs will improve Outcome: Adequate for Discharge   Problem: Clinical Measurements: Goal: Ability to maintain clinical measurements within normal limits will improve Outcome: Adequate for Discharge Goal: Will remain free from infection Outcome: Adequate for Discharge Goal: Diagnostic test results will improve Outcome: Adequate for Discharge Goal: Respiratory complications will improve Outcome: Adequate for Discharge Goal: Cardiovascular complication will be avoided Outcome: Adequate for Discharge   Problem: Activity: Goal: Risk for activity intolerance will decrease Outcome: Adequate for Discharge   Problem: Nutrition: Goal: Adequate nutrition will be maintained Outcome: Adequate for Discharge   Problem: Coping: Goal: Level of anxiety will decrease Outcome: Adequate for Discharge   Problem: Elimination: Goal: Will not experience complications related to bowel motility Outcome: Adequate for Discharge Goal: Will not experience complications related to urinary retention Outcome: Adequate for Discharge   Problem: Pain Managment: Goal: General experience of comfort will improve Outcome: Adequate for Discharge   Problem: Safety: Goal: Ability to remain free from injury will improve Outcome: Adequate for Discharge   Problem: Skin Integrity: Goal: Risk for impaired skin integrity will decrease Outcome: Adequate for Discharge   Problem: Education: Goal: Understanding of CV disease, CV risk reduction, and recovery process will improve Outcome: Adequate for Discharge Goal: Individualized Educational Video(s) Outcome: Adequate for Discharge   Problem:  Activity: Goal: Ability to return to baseline activity level will improve Outcome: Adequate for Discharge   Problem: Cardiovascular: Goal: Ability to achieve and maintain adequate cardiovascular perfusion will improve Outcome: Adequate for Discharge Goal: Vascular access site(s) Level 0-1 will be maintained Outcome: Adequate for Discharge   Problem: Health Behavior/Discharge Planning: Goal: Ability to safely manage health-related needs after discharge will improve Outcome: Adequate for Discharge   Problem: Education: Goal: Understanding of CV disease, CV risk reduction, and recovery process will improve Outcome: Adequate for Discharge Goal: Individualized Educational Video(s) Outcome: Adequate for Discharge   Problem: Activity: Goal: Ability to return to baseline activity level will improve Outcome: Adequate for Discharge   Problem: Cardiovascular: Goal: Ability to achieve and maintain adequate cardiovascular perfusion will improve Outcome: Adequate for Discharge Goal: Vascular access site(s) Level 0-1 will be maintained Outcome: Adequate for Discharge   Problem: Health Behavior/Discharge Planning: Goal: Ability to safely manage health-related needs after discharge will improve Outcome: Adequate for Discharge   

## 2020-03-20 NOTE — Progress Notes (Signed)
Office Visit    Patient Name: Margaret Payne Date of Encounter: 03/21/2020  Primary Care Provider:  Birdie Sons, MD Primary Cardiologist:  Nelva Bush, MD Electrophysiologist:  None   Chief Complaint    WARRENE KAPFER is a 79 y.o. female with a hx of CAD s/p DES to LAD 03/13/20, HFrEF secondary to ICM, DM2, HTN, HLD, CKD III, obesity presents today for hospital follow up.   Past Medical History    Past Medical History:  Diagnosis Date  . Arthritis   . Diabetes mellitus without complication ([place])    type 2  . Headache    migrains  . HOH (hard of hearing)   . Hyperlipidemia   . Hypertension    Past Surgical History:  Procedure Laterality Date  . ABDOMINAL HYSTERECTOMY    . BREAST BIOPSY    . CARDIAC CATHETERIZATION    . CATARACT EXTRACTION W/PHACO Right 10/03/2014   Procedure: CATARACT EXTRACTION PHACO AND INTRAOCULAR LENS PLACEMENT (IOC);  Surgeon: Birder Robson, MD;  Location: [facility] ORS;  Service: Ophthalmology;  Laterality: Right;  [place] 00:57 AP% 16.7 CDE 9.52 Fluid pack LSL#[id number] H  . 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   Procedure: CORONARY STENT INTERVENTION;  Surgeon: Nelva Bush, MD;  Location: [place] [facility];  Service: Cardiovascular;  Laterality: N/A;  . INJECTION KNEE    . LEFT HEART CATH AND CORONARY ANGIOGRAPHY N/A 03/13/2020   Procedure: LEFT HEART CATH AND CORONARY ANGIOGRAPHY;  Surgeon: Nelva Bush, MD;  Location: [place] [facility];  Service: Cardiovascular;  Laterality: N/A;    Allergies  Allergies  Allergen Reactions  . Codeine Sulfate Nausea Only    History of Present Illness    RAJAH LAMBA is a 79 y.o. female with a hx of CAD s/p DES to LAD 03/13/20, HFrEF secondary to ICM, DM2, HTN, HLD, CKD III, obesity last seen while hospitalized.  Presented to [facility] 03/12/20 with chest pain diagnosed with NSTEMI. Underwent LHC 03/13/20 with severe single vessel CAD with sequential 99% ? 70% LAD stenosis trested  with DES. Also mild to moderate nonobstructive CAD to ostial LAD, ramus intermedius, distal LCx, prox/mid RCA. Echo 03/13/20 LVEF 40-45%, wall motion abnormalities, gr1DD, RV normal size and function, no significant valvular abnormalities. She was recommended for DAPT x12 months and Pravastatin transitioned to Atorvastatin.   She presents for hospital follow-up today with her daughter.  Reviewed her testing in the hospital in detail.  She is appreciative of the explanation.  Shares with me that since discharge she has been looking at her labels carefully to avoid excessive sodium.  She also follows a diabetic diet.  She tells me she does think she drinks closer to 2.5 liters of fluid per day and we discussed fluid restriction to less than 2 L.  She denies lower extremity edema, orthopnea, PND.  Reports no shortness of breath at rest and tells me her dyspnea on exertion is improving.  She is trying to do some walking at home.  EKGs/Labs/Other Studies Reviewed:   The following studies were reviewed today:  Echo 03/13/20  1. Left ventricular ejection fraction, by estimation, is 40 to 45%. The  left ventricle has mildly decreased function. The left ventricle  demonstrates regional wall motion abnormalities (see scoring  diagram/findings for description). Left ventricular  diastolic parameters are consistent with Grade I diastolic dysfunction  (impaired relaxation).   2. Right ventricular systolic function is normal. The right ventricular  size is normal.   3. The mitral  valve was not well visualized. No evidence of mitral valve  regurgitation. No evidence of mitral stenosis.   4. The aortic valve was not well visualized. Aortic valve regurgitation  is not visualized. No aortic stenosis is present.   LHC 03/13/20 Conclusions: 1. Severe single-vessel coronary artery disease with sequential 99% and 70% mid LAD stenoses.  Mild to moderate, nonobstructive CAD also noted in the ostial LAD, ramus  intermedius, distal LCx, and proximal/mid RCA. 2. Normal left ventricular filling pressure. 3. Successful PCI to sequential mid LAD stenoses using resolute Onyx 2.5 x 34 mm drug-eluting stent with 0% residual stenosis and TIMI-3 flow.   Recommendations: 1. Dual antiplatelet therapy with aspirin and ticagrelor for at least 12 months. 2. Aggressive secondary prevention of coronary artery disease. 3. Gentle post catheterization hydration. 4. Follow-up transthoracic echocardiogram with escalation of goal-directed medical therapy for likely reduced LVEF (left ventriculogram not performed in the setting of CKD).   EKG:  EKG is ordered today.  The ekg ordered today demonstrates NSR 81 bpm with no acute ST/T wave changes. She has stable TWI in lead V2. Her ST/T wave abnormality in anterior lead has improved compared to previous.   Recent Labs: 03/13/2020: ALT 22; Magnesium 1.9 03/14/2020: BUN 21; Creatinine, Ser 1.55; Hemoglobin 11.4; Platelets 246; Potassium 3.9; Sodium 140  Recent Lipid Panel    Component Value Date/Time   CHOL 170 03/13/2020 0510   TRIG 198 (H) 03/13/2020 0510   HDL 39 (L) 03/13/2020 0510   CHOLHDL 4.4 03/13/2020 0510   VLDL 40 03/13/2020 0510   LDLCALC 91 03/13/2020 0510    Home Medications   Current Meds  Medication Sig  . acetaminophen (TYLENOL) 500 MG tablet Take 500 mg by mouth every 6 (six) hours as needed.  Marland Kitchen aspirin EC 81 MG EC tablet Take 1 tablet (81 mg total) by mouth daily. Swallow whole.  Marland Kitchen atorvastatin (LIPITOR) 80 MG tablet Take 1 tablet (80 mg total) by mouth daily.  . carvedilol (COREG) 3.125 MG tablet Take 1 tablet (3.125 mg total) by mouth 2 (two) times daily with a meal.  . Cholecalciferol (VITAMIN D3) 5000 units TABS Take 5,000 Units by mouth daily.  . Dulaglutide (TRULICITY) 1.5 RJ/1.8AC SOPN Inject into the skin once a week.   . insulin lispro protamine-lispro (HUMALOG 75/25 MIX) (75-25) 100 UNIT/ML SUSP injection Inject into the skin 2 (two)  times daily with a meal. 40 units in AM and 20 units in PM  . lisinopril (ZESTRIL) 20 MG tablet Take 1 tablet (20 mg total) by mouth daily.  . Multiple Vitamin (MULTI-VITAMINS) TABS Take by mouth. One a day vitamin- womens health  . ticagrelor (BRILINTA) 90 MG TABS tablet Take 1 tablet (90 mg total) by mouth 2 (two) times daily.  . [DISCONTINUED] atorvastatin (LIPITOR) 80 MG tablet Take 1 tablet (80 mg total) by mouth daily.  . [DISCONTINUED] carvedilol (COREG) 3.125 MG tablet Take 1 tablet (3.125 mg total) by mouth 2 (two) times daily with a meal.  . [DISCONTINUED] lisinopril (ZESTRIL) 20 MG tablet Take 1 tablet (20 mg total) by mouth daily.  . [DISCONTINUED] ticagrelor (BRILINTA) 90 MG TABS tablet Take 1 tablet (90 mg total) by mouth 2 (two) times daily.     Review of Systems  All other systems reviewed and are otherwise negative except as noted above.  Physical Exam    VS:  BP 100/60 (BP Location: Left Arm, Patient Position: Sitting, Cuff Size: Normal)   Pulse 81   Ht 5' (  1.524 m)   Wt 165 lb (74.8 kg)   SpO2 98%   BMI 32.22 kg/m  , BMI Body mass index is 32.22 kg/m.  Wt Readings from Last 3 Encounters:  03/21/20 165 lb (74.8 kg)  03/14/20 166 lb 9.6 oz (75.6 kg)  01/28/19 190 lb 6.4 oz (86.4 kg)    GEN: Well nourished, well developed, in no acute distress. HEENT: normal. Neck: Supple, no JVD, carotid bruits, or masses. Cardiac: RRR, no murmurs, rubs, or gallops. No clubbing, cyanosis, edema.  Radials/DP/PT 2+ and equal bilaterally.  Respiratory:  Respirations regular and unlabored, clear to auscultation bilaterally. GI: Soft, nontender, nondistended. MS: No deformity or atrophy. Skin: Warm and dry, no rash.  Left radial cath site clean, dry, intact.  Mild yellowed ecchymosis with no hematoma nor evidence of infection. Neuro:  Strength and sensation are intact. Psych: Normal affect.  Assessment & Plan    1. CAD s/p DES to LAD 03/13/20 -stable no anginal symptoms.  EKG  today with no acute ST/T wave changes.  GDMT includes DAPT aspirin/Brilinta for 1 year from intervention, Coreg, atorvastatin.  Cardiac rehab referral placed during hospitalization she was encouraged to participate.  2. HFrEF secondary to ICM - EF 03/13/20 with EF 40-45% and anterior WMA.  Euvolemic well compensated on exam.  GDMT includes aspirin, Brilinta, atorvastatin, Coreg, lisinopril.  Will defer addition of spironolactone due to CKD 3B and low normal blood pressure.  No indication for loop diuretic at this time.  Education provided on low-sodium, heartily diet and fluid restriction less than 2 L.  Cardiac rehab referral placed during hospitalization she was encouraged to participate.  3. HTN - BP well controlled. Continue current antihypertensive regimen.  While hypertension is listed in her medical history she reports never having issues with her blood pressure.  Her systolic blood pressure while hospitalized ranged from 95-118.  She denies lightheadedness, dizziness, near-syncope, syncope.  4. HLD, LDL goal <70 - Pravastatin switched to Atorvastatin during hospitalization due to LDL of 90. Plan for repeat lipid panel and CMP a few days prior to her next follow up.   5. DM2 - Continue to follow with PCP.  6. CKD IIIb - Careful titration of diuretics and antihypertensives.  7. Obesity -weight loss via diet and exercise encouraged. Discussed implications of weight on CAD, HF, DM2.   Disposition: Follow up in 2 month(s) with Dr. Saunders Revel or APP   Signed, Loel Dubonnet, NP 03/21/2020, 12:23 PM [place]

## 2020-03-21 ENCOUNTER — Ambulatory Visit: Payer: PPO | Admitting: Family

## 2020-03-21 ENCOUNTER — Encounter: Payer: Self-pay | Admitting: Family

## 2020-03-21 ENCOUNTER — Other Ambulatory Visit: Payer: Self-pay

## 2020-03-21 VITALS — BP 100/60 | HR 81 | Ht 60.0 in | Wt 165.0 lb

## 2020-03-21 DIAGNOSIS — I255 Ischemic cardiomyopathy: Secondary | ICD-10-CM

## 2020-03-21 DIAGNOSIS — E785 Hyperlipidemia, unspecified: Secondary | ICD-10-CM | POA: Diagnosis not present

## 2020-03-21 DIAGNOSIS — Z794 Long term (current) use of insulin: Secondary | ICD-10-CM | POA: Diagnosis not present

## 2020-03-21 DIAGNOSIS — I25118 Atherosclerotic heart disease of native coronary artery with other forms of angina pectoris: Secondary | ICD-10-CM | POA: Diagnosis not present

## 2020-03-21 DIAGNOSIS — N1832 Chronic kidney disease, stage 3b: Secondary | ICD-10-CM | POA: Diagnosis not present

## 2020-03-21 DIAGNOSIS — I10 Essential (primary) hypertension: Secondary | ICD-10-CM | POA: Diagnosis not present

## 2020-03-21 DIAGNOSIS — E1121 Type 2 diabetes mellitus with diabetic nephropathy: Secondary | ICD-10-CM | POA: Diagnosis not present

## 2020-03-21 MED ORDER — ATORVASTATIN CALCIUM 80 MG PO TABS: 80.0000 mg | ORAL_TABLET | Freq: Every day | ORAL | 5 refills | Status: DC

## 2020-03-21 MED ORDER — LISINOPRIL 20 MG PO TABS: 20.0000 mg | ORAL_TABLET | Freq: Every day | ORAL | 5 refills | Status: DC

## 2020-03-21 MED ORDER — CARVEDILOL 3.125 MG PO TABS: 3.1250 mg | ORAL_TABLET | Freq: Two times a day (BID) | ORAL | 5 refills | Status: DC

## 2020-03-21 MED ORDER — TICAGRELOR 90 MG PO TABS: 90.0000 mg | ORAL_TABLET | Freq: Two times a day (BID) | ORAL | 5 refills | Status: DC

## 2020-03-21 NOTE — Patient Instructions (Addendum)
Medication Instructions:  No medication changes today. Cardiac (your heart) medications have been refilled  *If you need a refill on your cardiac medications before your next appointment, please call your pharmacy*   Lab Work: Your physician recommends that you return for lab work a few days prior to your next appointment ( in two months)  for fasting cholesterol panel at the [facility].  Walk into medical mall at the check in desk, they will direct you to lab registration, hours for labs are Monday-Friday 07:00am-5:30pm (no appointment necessary)  If you have labs (blood work) drawn today and your tests are completely normal, you will receive your results only by: Marland Kitchen MyChart Message (if you have MyChart) OR . A paper copy in the mail If you have any lab test that is abnormal or we need to change your treatment, we will call you to review the results.   Testing/Procedures: Your echocardiogram showed your heart pumping function was mildly decreased. Your stent will help to increase the heart pumping function. Your medications will also help increase your heart pumping function. We will plan to repeat your echocardiogram in about 3 months.   Follow-Up: At [facility], you and your health needs are our priority.  As part of our continuing mission to provide you with exceptional heart care, we have created designated Provider Care Teams.  These Care Teams include your primary Cardiologist (physician) and Advanced Practice Providers (APPs -  Physician Assistants and Nurse Practitioners) who all work together to provide you with the care you need, when you need it.  We recommend signing up for the patient portal called "MyChart".  Sign up information is provided on this After Visit Summary.  MyChart is used to connect with patients for Virtual Visits (Telemedicine).  Patients are able to view lab/test results, encounter notes, upcoming appointments, etc.  Non-urgent messages can be sent to your  provider as well.   To learn more about what you can do with MyChart, go to [email or web address].    Your next appointment:   2 month(s)  The format for your next appointment:   In Person  Provider:   You may see Nelva Bush, MD or one of the following Advanced Practice Providers on your designated Care Team:    Murray Hodgkins, NP  Christell Faith, PA-C  Marrianne Mood, PA-C  Cadence Lanark, [place]  Laurann Montana, NP  Other Instructions   You have been referred to cardiac rehab. This is a combination program including monitored exercise, dietary education, and support group. We strongly recommend participating in the program. Expect a phone call from them in approximately 2 weeks. If you do not hear from them, the phone number for cardiac rehab at [facility] is [phone].   Heart Failure Eating Plan Heart failure, also called congestive heart failure, occurs when your heart does not pump blood well enough to meet your body's needs for oxygen-rich blood. Heart failure is a long-term (chronic) condition. Living with heart failure can be challenging. However, following your health care provider's instructions about a healthy lifestyle and working with a diet and nutrition specialist (dietitian) to choose the right foods may help to improve your symptoms. What are tips for following this plan? Reading food labels  Check food labels for the amount of sodium per serving. Choose foods that have less than 140 mg (milligrams) of sodium in each serving.  Check food labels for the number of calories per serving. This is important if you need to limit your daily  calorie intake to lose weight.  Check food labels for the serving size. If you eat more than one serving, you will be eating more sodium and calories than what is listed on the label.  Look for foods that are labeled as "sodium-free," "very low sodium," or "low sodium." ? Foods labeled as "reduced sodium" or "lightly salted" may  still have more sodium than what is recommended for you. Cooking  Avoid adding salt when cooking. Ask your health care provider or dietitian before using salt substitutes.  Season food with salt-free seasonings, spices, or herbs. Check the label of seasoning mixes to make sure they do not contain salt.  Cook with heart-healthy oils, such as olive, canola, soybean, or sunflower oil.  Do not fry foods. Cook foods using low-fat methods, such as baking, boiling, grilling, and broiling.  Limit unhealthy fats when cooking by: ? Removing the skin from poultry, such as chicken. ? Removing all visible fats from meats. ? Skimming the fat off from stews, soups, and gravies before serving them. Meal planning   Limit your intake of: ? Processed, canned, or pre-packaged foods. ? Foods that are high in trans fat, such as fried foods. ? Sweets, desserts, sugary drinks, and other foods with added sugar. ? Full-fat dairy products, such as whole milk.  Eat a balanced diet that includes: ? 4-5 servings of fruit each day and 4-5 servings of vegetables each day. At each meal, try to fill half of your plate with fruits and vegetables. ? Up to 6-8 servings of whole grains each day. ? Up to 2 servings of lean meat, poultry, or fish each day. One serving of meat is equal to 3 oz. This is about the same size as a deck of cards. ? 2 servings of low-fat dairy each day. ? Heart-healthy fats. Healthy fats called omega-3 fatty acids are found in foods such as flaxseed and cold-water fish like sardines, salmon, and mackerel.  Aim to eat 25-35 g (grams) of fiber a day. Foods that are high in fiber include apples, broccoli, carrots, beans, peas, and whole grains.  Do not add salt or condiments that contain salt (such as soy sauce) to foods before eating.  When eating at a restaurant, ask that your food be prepared with less salt or no salt, if possible.  Try to eat 2 or more vegetarian meals each week.  Eat more  home-cooked food and eat less restaurant, buffet, and fast food. General information  Do not eat more than 2,300 mg of salt (sodium) a day. The amount of sodium that is recommended for you may be lower, depending on your condition.  Maintain a healthy body weight as directed. Ask your health care provider what a healthy weight is for you. ? Check your weight every day. ? Work with your health care provider and dietitian to make a plan that is right for you to lose weight or maintain your current weight.  Limit how much fluid you drink. Ask your health care provider or dietitian how much fluid you can have each day.  Limit or avoid alcohol as told by your health care provider or dietitian. Recommended foods The items listed may not be a complete list. Talk with your dietitian about what dietary choices are best for you. Fruits All fresh, frozen, and canned fruits. Dried fruits, such as raisins, prunes, and cranberries. Vegetables All fresh vegetables. Vegetables that are frozen without sauce or added salt. Low-sodium or sodium-free canned vegetables. Grains Bread with  less than 80 mg of sodium per slice. Whole-wheat pasta, quinoa, and brown rice. Oats and oatmeal. Barley. Welcome. Grits and cream of wheat. Whole-grain and whole-wheat cold cereal. Meats and other protein foods Lean cuts of meat. Skinless chicken and [place]. Fish with high omega-3 fatty acids, such as salmon, sardines, and other cold-water fishes. Eggs. Dried beans, peas, and edamame. Unsalted nuts and nut butters. Dairy Low-fat or nonfat (skim) milk and dried milk. Rice milk, soy milk, and almond milk. Low-fat or nonfat yogurt. Small amounts of reduced-sodium block cheese. Low-sodium cottage cheese. Fats and oils Olive, canola, soybean, flaxseed, or sunflower oil. Avocado. Sweets and desserts Apple sauce. Granola bars. Sugar-free pudding and gelatin. Frozen fruit bars. Seasoning and other foods Fresh and dried herbs. Lemon  or lime juice. Vinegar. Low-sodium ketchup. Salt-free marinades, salad dressings, sauces, and seasonings. The items listed above may not be a complete list of foods and beverages you can eat. Contact a dietitian for more information. Foods to avoid The items listed may not be a complete list. Talk with your dietitian about what dietary choices are best for you. Fruits Fruits that are dried with sodium-containing preservatives. Vegetables Canned vegetables. Frozen vegetables with sauce or seasonings. Creamed vegetables. [place] fries. Onion rings. Pickled vegetables and sauerkraut. Grains Bread with more than 80 mg of sodium per slice. Hot or cold cereal with more than 140 mg sodium per serving. Salted pretzels and crackers. Pre-packaged breadcrumbs. Bagels, croissants, and biscuits. Meats and other protein foods Ribs and chicken wings. Bacon, ham, pepperoni, bologna, salami, and packaged luncheon meats. Hot dogs, bratwurst, and sausage. Canned meat. Smoked meat and fish. Salted nuts and seeds. Dairy Whole milk, half-and-half, and cream. Buttermilk. Processed cheese, cheese spreads, and cheese curds. Regular cottage cheese. Feta cheese. Shredded cheese. String cheese. Fats and oils Butter, lard, shortening, ghee, and bacon fat. Canned and packaged gravies. Seasoning and other foods Onion salt, garlic salt, table salt, and sea salt. Marinades. Regular salad dressings. Relishes, pickles, and olives. Meat flavorings and tenderizers, and bouillon cubes. Horseradish, ketchup, and mustard. Worcestershire sauce. Teriyaki sauce, soy sauce (including reduced sodium). Hot sauce and Tabasco sauce. Steak sauce, fish sauce, oyster sauce, and cocktail sauce. Taco seasonings. Barbecue sauce. Tartar sauce. The items listed above may not be a complete list of foods and beverages you should avoid. Contact a dietitian for more information. Summary  A heart failure eating plan includes changes that limit your intake  of sodium and unhealthy fat, and it may help you lose weight or maintain a healthy weight. Your health care provider may also recommend limiting how much fluid you drink.  Most people with heart failure should eat no more than 2,300 mg of salt (sodium) a day. The amount of sodium that is recommended for you may be lower, depending on your condition.  Contact your health care provider or dietitian before making any major changes to your diet. This information is not intended to replace advice given to you by your health care provider. Make sure you discuss any questions you have with your health care provider. Document Revised: 06/03/2018 Document Reviewed: 08/22/2016 Elsevier Patient Education  [place].

## 2020-03-26 NOTE — Progress Notes (Signed)
Patient ID: Margaret Payne, female    DOB: 06-Sep-1940, 79 y.o.   MRN: [id number]  HPI  Ms Rainone is a 79 y/o female with a history of DM, hyperlipidemia, HTN, CKD, arthritis and chronic heart failure.   Echo report from 03/13/20 reviewed and showed an EF of 40-45%.  Cath done 03/13/20 and showed: 1. Severe single-vessel coronary artery disease with sequential 99% and 70% mid LAD stenoses.  Mild to moderate, nonobstructive CAD also noted in the ostial LAD, ramus intermedius, distal LCx, and proximal/mid RCA. 2. Normal left ventricular filling pressure. 3. Successful PCI to sequential mid LAD stenoses using resolute Onyx 2.5 x 34 mm drug-eluting stent with 0% residual stenosis and TIMI-3 flow.  Admitted 03/12/20 due to chest pain. Cardiology consult obtained. Cath done with results above with successful PCI of mid LAD. Discharged after 2 days.   She presents today for her initial visit with a chief complaint of minimal shortness of breath upon moderate exertion. She describes this as having been present for several months although is improving. She has associated fatigue, difficulty sleeping and intermittent knee pain along with this. She denies any dizziness, abdominal distention, palpitations, pedal edema, chest pain or weight gain.   Past Medical History:  Diagnosis Date  . Arthritis   . CHF (congestive heart failure) ([place])   . Chronic kidney disease   . Diabetes mellitus without complication ([place])    type 2  . Headache    migrains  . HOH (hard of hearing)   . Hyperlipidemia   . Hypertension    Past Surgical History:  Procedure Laterality Date  . ABDOMINAL HYSTERECTOMY    . BREAST BIOPSY    . CARDIAC CATHETERIZATION    . CATARACT EXTRACTION W/PHACO Right 10/03/2014   Procedure: CATARACT EXTRACTION PHACO AND INTRAOCULAR LENS PLACEMENT (IOC);  Surgeon: Birder Robson, MD;  Location: [facility] ORS;  Service: Ophthalmology;  Laterality: Right;  [place] 00:57 AP% 16.7 CDE 9.52 Fluid pack  [id number] H  . 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   Procedure: CORONARY STENT INTERVENTION;  Surgeon: Nelva Bush, MD;  Location: [place] [facility];  Service: Cardiovascular;  Laterality: N/A;  . INJECTION KNEE    . LEFT HEART CATH AND CORONARY ANGIOGRAPHY N/A 03/13/2020   Procedure: LEFT HEART CATH AND CORONARY ANGIOGRAPHY;  Surgeon: Nelva Bush, MD;  Location: [place] [facility];  Service: Cardiovascular;  Laterality: N/A;   Family History  Problem Relation Age of Onset  . Breast cancer Mother   . Emphysema Father   . Breast cancer Sister 60   Social History   Tobacco Use  . Smoking status: Never Smoker  . Smokeless tobacco: Never Used  Substance Use Topics  . Alcohol use: No    Alcohol/week: 0.0 standard drinks   Allergies  Allergen Reactions  . Codeine Sulfate Nausea Only   Prior to Admission medications   Medication Sig Start Date End Date Taking? Authorizing Provider  acetaminophen (TYLENOL) 500 MG tablet Take 500 mg by mouth every 6 (six) hours as needed.   Yes [provider]  aspirin EC 81 MG EC tablet Take 1 tablet (81 mg total) by mouth daily. Swallow whole. 03/15/20  Yes Donne Hazel, MD  atorvastatin (LIPITOR) 80 MG tablet Take 1 tablet (80 mg total) by mouth daily. 03/21/20 09/17/20 Yes Loel Dubonnet, NP  carvedilol (COREG) 3.125 MG tablet Take 1 tablet (3.125 mg total) by mouth 2 (two) times daily with a meal. 03/21/20 06/19/20 Yes Laurann Montana  S, NP  Cholecalciferol (VITAMIN D3) 5000 units TABS Take 5,000 Units by mouth daily.   Yes [provider]  Dulaglutide (TRULICITY) 1.5 EQ/6.8TM SOPN Inject into the skin once a week.    Yes [provider]  insulin lispro protamine-lispro (HUMALOG 75/25 MIX) (75-25) 100 UNIT/ML SUSP injection Inject into the skin 2 (two) times daily with a meal. 40 units in AM and 20 units in PM   Yes [provider]  lisinopril (ZESTRIL) 20 MG tablet Take 1 tablet (20 mg total)  by mouth daily. 03/21/20 09/17/20 Yes Loel Dubonnet, NP  Multiple Vitamin (MULTI-VITAMINS) TABS Take by mouth. One a day vitamin- womens health   Yes [provider]  ticagrelor (BRILINTA) 90 MG TABS tablet Take 1 tablet (90 mg total) by mouth 2 (two) times daily. 03/21/20 06/19/20 Yes Loel Dubonnet, NP    Review of Systems  Constitutional: Positive for fatigue. Negative for appetite change.  HENT: Positive for hearing loss. Negative for congestion and sore throat.   Eyes: Negative.   Respiratory: Positive for shortness of breath. Negative for chest tightness.   Cardiovascular: Negative for chest pain, palpitations and leg swelling.  Gastrointestinal: Negative for abdominal distention and abdominal pain.  Endocrine: Negative.   Genitourinary: Negative.   Musculoskeletal: Positive for arthralgias (knee pain at times). Negative for back pain.  Skin: Negative.   Allergic/Immunologic: Negative.   Neurological: Negative for dizziness and light-headedness.  Hematological: Negative for adenopathy. Does not bruise/bleed easily.  Psychiatric/Behavioral: Positive for sleep disturbance (trouble sleeping at night). Negative for dysphoric mood. The patient is not nervous/anxious.    Vitals:   03/27/20 1333  BP: (!) 108/55  Pulse: 88  Resp: 16  SpO2: 99%  Weight: 164 lb (74.4 kg)  Height: 5' (1.524 m)   Wt Readings from Last 3 Encounters:  03/27/20 164 lb (74.4 kg)  03/21/20 165 lb (74.8 kg)  03/14/20 166 lb 9.6 oz (75.6 kg)   Lab Results  Component Value Date   CREATININE 1.55 (H) 03/14/2020   CREATININE 1.60 (H) 03/13/2020   CREATININE 1.71 (H) 03/12/2020    Physical Exam Vitals and nursing note reviewed. Exam conducted with a chaperone present (daughter is present).  Constitutional:      Appearance: Normal appearance.  HENT:     Head: Normocephalic and atraumatic.     Right Ear: Decreased hearing noted.     Left Ear: Decreased hearing noted.  Cardiovascular:      Rate and Rhythm: Normal rate and regular rhythm.  Pulmonary:     Effort: Pulmonary effort is normal. No respiratory distress.     Breath sounds: No wheezing or rales.  Abdominal:     General: There is no distension.     Palpations: Abdomen is soft.     Tenderness: There is no abdominal tenderness.  Musculoskeletal:        General: No tenderness.     Cervical back: Normal range of motion and neck supple.     Right lower leg: No edema.     Left lower leg: No edema.  Skin:    General: Skin is warm and dry.  Neurological:     General: No focal deficit present.     Mental Status: She is alert and oriented to person, place, and time.  Psychiatric:        Mood and Affect: Mood normal.        Behavior: Behavior normal.        Thought Content:  Thought content normal.     Assessment & Plan:  1: Chronic heart failure with mildly reduced ejection fraction- - NYHA class II - euvolemic today - weighing daily; reminded to call for an overnight weight gain of > 2 pounds or a weekly weight gain of > 5 pounds - not adding salt to her food and has been trying to read food labels for sodium content; reviewed the importance of trying to keep her daily sodium intake to ~ 2000mg  / day; written dietary information and low sodium cookbook were provided to the patient - saw cardiology Gilford Rile) 03/21/20  - current BP does not allow for changing lisinopril to entresto - reports receiving her flu vaccine for this season - reports receiving both covid vaccines and the additional covid booster  2: HTN- - BP looks good today - sees PCP Caryn Section) next week - BMP 03/14/20 reviewed and showed sodium 140, potassium 3.9, creatinine 1.55 and GFR 34  3: CKD- - saw nephrology ([place]) 03/05/20  4: DM- - saw endocrinology ([place]) 01/03/20 - A1c 03/13/20 was 7.7% - glucose at home today was 148   Medication list was reviewed.   Return in 1 month or sooner for any questions/problems before then. Most likely  will not need to return after her next visit if she remains stable.

## 2020-03-27 ENCOUNTER — Other Ambulatory Visit: Payer: Self-pay

## 2020-03-27 ENCOUNTER — Ambulatory Visit: Payer: PPO | Attending: Family | Admitting: Family

## 2020-03-27 ENCOUNTER — Encounter: Payer: Self-pay | Admitting: Family

## 2020-03-27 VITALS — BP 108/55 | HR 88 | Resp 16 | Ht 60.0 in | Wt 164.0 lb

## 2020-03-27 DIAGNOSIS — Z8249 Family history of ischemic heart disease and other diseases of the circulatory system: Secondary | ICD-10-CM | POA: Diagnosis not present

## 2020-03-27 DIAGNOSIS — M25569 Pain in unspecified knee: Secondary | ICD-10-CM | POA: Insufficient documentation

## 2020-03-27 DIAGNOSIS — M1990 Unspecified osteoarthritis, unspecified site: Secondary | ICD-10-CM | POA: Insufficient documentation

## 2020-03-27 DIAGNOSIS — E1122 Type 2 diabetes mellitus with diabetic chronic kidney disease: Secondary | ICD-10-CM | POA: Insufficient documentation

## 2020-03-27 DIAGNOSIS — Z7982 Long term (current) use of aspirin: Secondary | ICD-10-CM | POA: Insufficient documentation

## 2020-03-27 DIAGNOSIS — Z794 Long term (current) use of insulin: Secondary | ICD-10-CM | POA: Insufficient documentation

## 2020-03-27 DIAGNOSIS — I5022 Chronic systolic (congestive) heart failure: Secondary | ICD-10-CM | POA: Insufficient documentation

## 2020-03-27 DIAGNOSIS — R0602 Shortness of breath: Secondary | ICD-10-CM | POA: Insufficient documentation

## 2020-03-27 DIAGNOSIS — I2510 Atherosclerotic heart disease of native coronary artery without angina pectoris: Secondary | ICD-10-CM | POA: Diagnosis not present

## 2020-03-27 DIAGNOSIS — I10 Essential (primary) hypertension: Secondary | ICD-10-CM

## 2020-03-27 DIAGNOSIS — N1832 Chronic kidney disease, stage 3b: Secondary | ICD-10-CM

## 2020-03-27 DIAGNOSIS — N189 Chronic kidney disease, unspecified: Secondary | ICD-10-CM | POA: Insufficient documentation

## 2020-03-27 DIAGNOSIS — G479 Sleep disorder, unspecified: Secondary | ICD-10-CM | POA: Insufficient documentation

## 2020-03-27 DIAGNOSIS — Z955 Presence of coronary angioplasty implant and graft: Secondary | ICD-10-CM | POA: Diagnosis not present

## 2020-03-27 DIAGNOSIS — Z79899 Other long term (current) drug therapy: Secondary | ICD-10-CM | POA: Insufficient documentation

## 2020-03-27 DIAGNOSIS — E785 Hyperlipidemia, unspecified: Secondary | ICD-10-CM | POA: Diagnosis not present

## 2020-03-27 DIAGNOSIS — I130 Hypertensive heart and chronic kidney disease with heart failure and stage 1 through stage 4 chronic kidney disease, or unspecified chronic kidney disease: Secondary | ICD-10-CM | POA: Diagnosis not present

## 2020-03-27 DIAGNOSIS — Z885 Allergy status to narcotic agent status: Secondary | ICD-10-CM | POA: Diagnosis not present

## 2020-03-27 DIAGNOSIS — E1121 Type 2 diabetes mellitus with diabetic nephropathy: Secondary | ICD-10-CM

## 2020-03-27 NOTE — Patient Instructions (Signed)
Continue weighing daily and call for an overnight weight gain of > 2 pounds or a weekly weight gain of >5 pounds. 

## 2020-03-28 ENCOUNTER — Encounter: Payer: Self-pay | Admitting: Family

## 2020-04-02 ENCOUNTER — Other Ambulatory Visit: Payer: Self-pay

## 2020-04-02 ENCOUNTER — Ambulatory Visit (INDEPENDENT_AMBULATORY_CARE_PROVIDER_SITE_OTHER): Payer: PPO | Admitting: Family Medicine

## 2020-04-02 ENCOUNTER — Encounter: Payer: Self-pay | Admitting: Family Medicine

## 2020-04-02 VITALS — BP 105/40 | HR 69 | Temp 97.9°F | Resp 16 | Ht 60.0 in | Wt 168.0 lb

## 2020-04-02 DIAGNOSIS — E1121 Type 2 diabetes mellitus with diabetic nephropathy: Secondary | ICD-10-CM | POA: Diagnosis not present

## 2020-04-02 DIAGNOSIS — Z794 Long term (current) use of insulin: Secondary | ICD-10-CM

## 2020-04-02 DIAGNOSIS — I2510 Atherosclerotic heart disease of native coronary artery without angina pectoris: Secondary | ICD-10-CM | POA: Insufficient documentation

## 2020-04-02 DIAGNOSIS — I25118 Atherosclerotic heart disease of native coronary artery with other forms of angina pectoris: Secondary | ICD-10-CM

## 2020-04-02 DIAGNOSIS — I5020 Unspecified systolic (congestive) heart failure: Secondary | ICD-10-CM | POA: Diagnosis not present

## 2020-04-02 DIAGNOSIS — N1832 Chronic kidney disease, stage 3b: Secondary | ICD-10-CM

## 2020-04-02 NOTE — Progress Notes (Signed)
Established patient visit   Patient: Margaret Payne   DOB: 1940-07-13   79 y.o. Female  MRN: [id number] Visit Date: 04/02/2020  Today's healthcare provider: Lelon Huh, MD   Chief Complaint  Patient presents with  . Hospitalization Follow-up   Subjective    HPI  Follow up Hospitalization  Patient was admitted to [facility] on 03/14/20 and discharged on 03/16/20.  She was treated for NSTEMI ND chf. She had 70-99% mid LAD lesion that was stented Treatment for this included starting on Lipitor and carvedilol. Patient has also seen Dr. Saunders Revel and follow up at [facility]. She is scheduled to have labs done next week and has follow up with Dr. Saunders Revel in January.   She reports good compliance with treatment. She reports this condition is improved. However, patient reports that she has gotten low BP readings at home. She does feel fatigued, but not feeling light headed or dizzy at all.        Medications: Outpatient Medications Prior to Visit  Medication Sig  . acetaminophen (TYLENOL) 500 MG tablet Take 500 mg by mouth every 6 (six) hours as needed.  Marland Kitchen aspirin EC 81 MG EC tablet Take 1 tablet (81 mg total) by mouth daily. Swallow whole.  Marland Kitchen atorvastatin (LIPITOR) 80 MG tablet Take 1 tablet (80 mg total) by mouth daily.  . carvedilol (COREG) 3.125 MG tablet Take 1 tablet (3.125 mg total) by mouth 2 (two) times daily with a meal.  . Cholecalciferol (VITAMIN D3) 5000 units TABS Take 5,000 Units by mouth daily.  . Dulaglutide (TRULICITY) 1.5 WJ/1.9JY SOPN Inject into the skin once a week.   . insulin lispro protamine-lispro (HUMALOG 75/25 MIX) (75-25) 100 UNIT/ML SUSP injection Inject into the skin 2 (two) times daily with a meal. 40 units in AM and 20 units in PM  . lisinopril (ZESTRIL) 20 MG tablet Take 1 tablet (20 mg total) by mouth daily.  . Multiple Vitamin (MULTI-VITAMINS) TABS Take by mouth. One a day vitamin- womens health  . ticagrelor (BRILINTA) 90 MG TABS tablet Take 1 tablet (90  mg total) by mouth 2 (two) times daily.   No facility-administered medications prior to visit.    Review of Systems  Constitutional: Negative.   Respiratory: Negative for cough and shortness of breath.   Cardiovascular: Negative for chest pain, palpitations and leg swelling.  Skin: Negative.   Neurological: Negative for dizziness, light-headedness and headaches.      Objective    BP (!) 105/40   Pulse 69   Temp 97.9 F (36.6 C)   Resp 16   Ht 5' (1.524 m)   Wt 168 lb (76.2 kg)   BMI 32.81 kg/m    Physical Exam    General: Appearance:    Obese female in no acute distress  Eyes:    PERRL, conjunctiva/corneas clear, EOM's intact       Lungs:     Clear to auscultation bilaterally, respirations unlabored  Heart:    Normal heart rate. Normal rhythm. No murmurs, rubs, or gallops.   MS:   All extremities are intact.   Neurologic:   Awake, alert, oriented x 3. No apparent focal neurological           defect.        No results found for any visits on 04/02/20.  Assessment & Plan     1. Coronary artery disease of native artery of native heart with stable angina pectoris (HCC) Asymptomatic. Compliant  with medication.  Continue aggressive risk factor modification.  No chest pain since hospital discharge.   2. HFrEF (heart failure with reduced ejection fraction) ([place]) Tolerating current medications well, follow up CHF clinic and cardiology as scheduled.   3. Chronic kidney disease, stage 3b ([place]) Is scheduled for CMP next week per CHF clinic.   4. Type 2 diabetes mellitus with diabetic nephropathy, with long-term current use of insulin (HCC) Lab Results  Component Value Date   HGBA1C 7.7 (H) 03/13/2020   Follow up Dr. Nilda Simmer as scheduled.    No follow-ups on file.         Lelon Huh, MD  [facility] [phone] (phone) [phone] (fax)  [place]

## 2020-04-03 DIAGNOSIS — M1712 Unilateral primary osteoarthritis, left knee: Secondary | ICD-10-CM | POA: Diagnosis not present

## 2020-04-11 DIAGNOSIS — Z794 Long term (current) use of insulin: Secondary | ICD-10-CM | POA: Diagnosis not present

## 2020-04-11 DIAGNOSIS — E11319 Type 2 diabetes mellitus with unspecified diabetic retinopathy without macular edema: Secondary | ICD-10-CM | POA: Diagnosis not present

## 2020-04-18 DIAGNOSIS — E1129 Type 2 diabetes mellitus with other diabetic kidney complication: Secondary | ICD-10-CM | POA: Diagnosis not present

## 2020-04-18 DIAGNOSIS — R809 Proteinuria, unspecified: Secondary | ICD-10-CM | POA: Diagnosis not present

## 2020-04-18 DIAGNOSIS — I214 Non-ST elevation (NSTEMI) myocardial infarction: Secondary | ICD-10-CM | POA: Diagnosis not present

## 2020-04-18 DIAGNOSIS — N1832 Chronic kidney disease, stage 3b: Secondary | ICD-10-CM | POA: Diagnosis not present

## 2020-04-18 DIAGNOSIS — E1122 Type 2 diabetes mellitus with diabetic chronic kidney disease: Secondary | ICD-10-CM | POA: Diagnosis not present

## 2020-04-18 DIAGNOSIS — E11319 Type 2 diabetes mellitus with unspecified diabetic retinopathy without macular edema: Secondary | ICD-10-CM | POA: Diagnosis not present

## 2020-04-18 DIAGNOSIS — E782 Mixed hyperlipidemia: Secondary | ICD-10-CM | POA: Diagnosis not present

## 2020-04-18 DIAGNOSIS — E1159 Type 2 diabetes mellitus with other circulatory complications: Secondary | ICD-10-CM | POA: Diagnosis not present

## 2020-04-18 DIAGNOSIS — Z794 Long term (current) use of insulin: Secondary | ICD-10-CM | POA: Diagnosis not present

## 2020-05-01 NOTE — Progress Notes (Signed)
Patient ID: Margaret Payne, female    DOB: 04-09-1941, 80 y.o.   MRN: [id number]  HPI  Margaret Payne is a 80 y/o female with a history of DM, hyperlipidemia, HTN, CKD, arthritis and chronic heart failure.   Echo report from 03/13/20 reviewed and showed an EF of 40-45%.  Cath done 03/13/20 and showed: 1. Severe single-vessel coronary artery disease with sequential 99% and 70% mid LAD stenoses.  Mild to moderate, nonobstructive CAD also noted in the ostial LAD, ramus intermedius, distal LCx, and proximal/mid RCA. 2. Normal left ventricular filling pressure. 3. Successful PCI to sequential mid LAD stenoses using resolute Onyx 2.5 x 34 mm drug-eluting stent with 0% residual stenosis and TIMI-3 flow.  Admitted 03/12/20 due to chest pain. Cardiology consult obtained. Cath done with results above with successful PCI of mid LAD. Discharged after 2 days.   She presents today for a follow-up visit with a chief complaint of minimal shortness of breath upon moderate exertion. She describes this as chronic in nature having been present for several years. She has associated fatigue, easy bruising, difficulty sleeping and intermittent joint pain along with this. She denies any dizziness, abdominal distention, palpitations, pedal edema, chest pain or weight gain.   Past Medical History:  Diagnosis Date  . Arthritis   . CHF (congestive heart failure) ([place])   . Chronic kidney disease   . Diabetes mellitus without complication ([place])    type 2  . Headache    migrains  . HOH (hard of hearing)   . Hyperlipidemia   . Hypertension    Past Surgical History:  Procedure Laterality Date  . ABDOMINAL HYSTERECTOMY    . BREAST BIOPSY    . CARDIAC CATHETERIZATION    . CATARACT EXTRACTION W/PHACO Right 10/03/2014   Procedure: CATARACT EXTRACTION PHACO AND INTRAOCULAR LENS PLACEMENT (IOC);  Surgeon: Birder Robson, MD;  Location: [facility];  Service: Ophthalmology;  Laterality: Right;  [place] 00:57 AP% 16.7 CDE  9.52 Fluid pack [id number]  . 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   Procedure: CORONARY STENT INTERVENTION;  Surgeon: Nelva Bush, MD;  Location: [facility];  Service: Cardiovascular;  Laterality: N/A;  . INJECTION KNEE    . LEFT HEART CATH AND CORONARY ANGIOGRAPHY N/A 03/13/2020   Procedure: LEFT HEART CATH AND CORONARY ANGIOGRAPHY;  Surgeon: Nelva Bush, MD;  Location: [facility];  Service: Cardiovascular;  Laterality: N/A;   Family History  Problem Relation Age of Onset  . Breast cancer Mother   . Emphysema Father   . Breast cancer Sister 71   Social History   Tobacco Use  . Smoking status: Never Smoker  . Smokeless tobacco: Never Used  Substance Use Topics  . Alcohol use: No    Alcohol/week: 0.0 standard drinks   Allergies  Allergen Reactions  . Codeine Sulfate Nausea Only   Prior to Admission medications   Medication Sig Start Date End Date Taking? Authorizing Provider  acetaminophen (TYLENOL) 500 MG tablet Take 500 mg by mouth every 6 (six) hours as needed.   Yes [provider]  aspirin EC 81 MG EC tablet Take 1 tablet (81 mg total) by mouth daily. Swallow whole. 03/15/20  Yes Donne Hazel, MD  atorvastatin (LIPITOR) 80 MG tablet Take 1 tablet (80 mg total) by mouth daily. 03/21/20 09/17/20 Yes Loel Dubonnet, NP  carvedilol (COREG) 3.125 MG tablet Take 1 tablet (3.125 mg total) by mouth 2 (two) times daily with a meal. 03/21/20 06/19/20 Yes  Loel Dubonnet, NP  Cholecalciferol (VITAMIN D3) 5000 units TABS Take 5,000 Units by mouth daily.   Yes [provider]  Dulaglutide (TRULICITY) 1.5 QM/0.8QP SOPN Inject into the skin once a week.    Yes [provider]  insulin lispro protamine-lispro (HUMALOG 75/25 MIX) (75-25) 100 UNIT/ML SUSP injection Inject into the skin 2 (two) times daily with a meal. 40 units in AM and 20 units in PM   Yes [provider]  lisinopril (ZESTRIL) 20 MG tablet Take 1  tablet (20 mg total) by mouth daily. 03/21/20 09/17/20 Yes Loel Dubonnet, NP  Multiple Vitamin (MULTI-VITAMINS) TABS Take by mouth. One a day vitamin- womens health   Yes [provider]  ticagrelor (BRILINTA) 90 MG TABS tablet Take 1 tablet (90 mg total) by mouth 2 (two) times daily. 05/02/20 04/27/21  Loel Dubonnet, NP     Review of Systems  Constitutional: Positive for fatigue. Negative for appetite change.  HENT: Positive for hearing loss. Negative for congestion and sore throat.   Eyes: Negative.   Respiratory: Positive for shortness of breath. Negative for chest tightness.   Cardiovascular: Negative for chest pain, palpitations and leg swelling.  Gastrointestinal: Negative for abdominal distention and abdominal pain.  Endocrine: Negative.   Genitourinary: Negative.   Musculoskeletal: Positive for arthralgias (knee pain at times). Negative for back pain.  Skin: Negative.   Allergic/Immunologic: Negative.   Neurological: Negative for dizziness and light-headedness.  Hematological: Negative for adenopathy. Bruises/bleeds easily.  Psychiatric/Behavioral: Positive for sleep disturbance (trouble sleeping at night). Negative for dysphoric mood. The patient is not nervous/anxious.    Vitals:   05/02/20 1000  BP: (!) 118/53  Pulse: 81  Resp: 18  SpO2: 99%  Weight: 164 lb 4 oz (74.5 kg)  Height: 5\' 1"  (1.549 m)   Wt Readings from Last 3 Encounters:  05/02/20 164 lb 4 oz (74.5 kg)  04/02/20 168 lb (76.2 kg)  03/27/20 164 lb (74.4 kg)   Lab Results  Component Value Date   CREATININE 1.55 (H) 03/14/2020   CREATININE 1.60 (H) 03/13/2020   CREATININE 1.71 (H) 03/12/2020    Physical Exam Vitals and nursing note reviewed. Exam conducted with a chaperone present (daughter is present).  Constitutional:      Appearance: Normal appearance.  HENT:     Head: Normocephalic and atraumatic.     Right Ear: Decreased hearing noted.     Left Ear: Decreased hearing noted.   Cardiovascular:     Rate and Rhythm: Normal rate and regular rhythm.  Pulmonary:     Effort: Pulmonary effort is normal. No respiratory distress.     Breath sounds: No wheezing or rales.  Abdominal:     General: There is no distension.     Palpations: Abdomen is soft.     Tenderness: There is no abdominal tenderness.  Musculoskeletal:        General: No tenderness.     Cervical back: Normal range of motion and neck supple.     Right lower leg: No edema.     Left lower leg: No edema.  Skin:    General: Skin is warm and dry.  Neurological:     General: No focal deficit present.     Mental Status: She is alert and oriented to person, place, and time.  Psychiatric:        Mood and Affect: Mood normal.        Behavior: Behavior normal.  Thought Content: Thought content normal.     Assessment & Plan:  1: Chronic heart failure with mildly reduced ejection fraction- - NYHA class II - euvolemic today - weighing daily; reminded to call for an overnight weight gain of > 2 pounds or a weekly weight gain of > 5 pounds - weight stable (164.4) from last visit here 1 month ago - not adding salt to her food and has been trying to read food labels for sodium content; reviewed the importance of trying to keep her daily sodium intake to ~ 2000mg  / day - saw cardiology Gilford Rile) 03/21/20  - current BP does not allow for changing lisinopril to entresto - reports receiving her flu vaccine for this season - reports receiving both covid vaccines and the additional covid booster  2: HTN- - BP looks good today; she says that she occasionally feels like it's low and gets dizzy/ off balance; encouraged her to get an easy to use BP cuff and check her BP a few times a week or when she feels dizzy - saw PCP Caryn Section) 04/02/20 - BMP 03/14/20 reviewed and showed sodium 140, potassium 3.9, creatinine 1.55 and GFR 34  3: CKD- - saw nephrology ([place]) 03/05/20 & returns February 2022  4: DM- - saw  endocrinology ([place]) 04/18/20 & returns 07/25/20 - A1c 03/13/20 was 7.7% - glucose at home today was 175   Medication bottles reviewed.   Return in 6 months or sooner for any questions/problems before then.

## 2020-05-02 ENCOUNTER — Ambulatory Visit: Payer: HMO | Attending: Family | Admitting: Family

## 2020-05-02 ENCOUNTER — Other Ambulatory Visit: Payer: Self-pay

## 2020-05-02 ENCOUNTER — Other Ambulatory Visit: Payer: Self-pay | Admitting: Family

## 2020-05-02 ENCOUNTER — Telehealth: Payer: Self-pay | Admitting: *Deleted

## 2020-05-02 ENCOUNTER — Encounter: Payer: Self-pay | Admitting: Family

## 2020-05-02 VITALS — BP 118/53 | HR 81 | Resp 18 | Ht 61.0 in | Wt 164.2 lb

## 2020-05-02 DIAGNOSIS — E1122 Type 2 diabetes mellitus with diabetic chronic kidney disease: Secondary | ICD-10-CM | POA: Insufficient documentation

## 2020-05-02 DIAGNOSIS — I2510 Atherosclerotic heart disease of native coronary artery without angina pectoris: Secondary | ICD-10-CM | POA: Insufficient documentation

## 2020-05-02 DIAGNOSIS — I10 Essential (primary) hypertension: Secondary | ICD-10-CM

## 2020-05-02 DIAGNOSIS — N1832 Chronic kidney disease, stage 3b: Secondary | ICD-10-CM

## 2020-05-02 DIAGNOSIS — Z7982 Long term (current) use of aspirin: Secondary | ICD-10-CM | POA: Diagnosis not present

## 2020-05-02 DIAGNOSIS — E1121 Type 2 diabetes mellitus with diabetic nephropathy: Secondary | ICD-10-CM

## 2020-05-02 DIAGNOSIS — N189 Chronic kidney disease, unspecified: Secondary | ICD-10-CM | POA: Insufficient documentation

## 2020-05-02 DIAGNOSIS — I130 Hypertensive heart and chronic kidney disease with heart failure and stage 1 through stage 4 chronic kidney disease, or unspecified chronic kidney disease: Secondary | ICD-10-CM | POA: Insufficient documentation

## 2020-05-02 DIAGNOSIS — Z885 Allergy status to narcotic agent status: Secondary | ICD-10-CM | POA: Diagnosis not present

## 2020-05-02 DIAGNOSIS — Z794 Long term (current) use of insulin: Secondary | ICD-10-CM | POA: Diagnosis not present

## 2020-05-02 DIAGNOSIS — I5022 Chronic systolic (congestive) heart failure: Secondary | ICD-10-CM | POA: Diagnosis not present

## 2020-05-02 DIAGNOSIS — Z79899 Other long term (current) drug therapy: Secondary | ICD-10-CM | POA: Insufficient documentation

## 2020-05-02 DIAGNOSIS — I25118 Atherosclerotic heart disease of native coronary artery with other forms of angina pectoris: Secondary | ICD-10-CM

## 2020-05-02 MED ORDER — TICAGRELOR 90 MG PO TABS: 90.0000 mg | ORAL_TABLET | Freq: Two times a day (BID) | ORAL | 11 refills | Status: DC

## 2020-05-02 NOTE — Telephone Encounter (Signed)
CHF clinic called and requested samples on pt. CHF clinic picked up samples  Placed 2 bottles upfront for pick up.  Lot# [id number] Exp: 3/24

## 2020-05-02 NOTE — Patient Instructions (Signed)
Continue weighing daily and call for an overnight weight gain of > 2 pounds or a weekly weight gain of >5 pounds. 

## 2020-05-25 ENCOUNTER — Other Ambulatory Visit: Payer: Self-pay

## 2020-05-25 ENCOUNTER — Encounter: Payer: Self-pay | Admitting: Internal Medicine

## 2020-05-25 ENCOUNTER — Ambulatory Visit: Payer: HMO | Admitting: Internal Medicine

## 2020-05-25 VITALS — BP 104/60 | HR 74 | Ht 60.0 in | Wt 163.2 lb

## 2020-05-25 DIAGNOSIS — D631 Anemia in chronic kidney disease: Secondary | ICD-10-CM | POA: Diagnosis not present

## 2020-05-25 DIAGNOSIS — E1169 Type 2 diabetes mellitus with other specified complication: Secondary | ICD-10-CM | POA: Diagnosis not present

## 2020-05-25 DIAGNOSIS — N184 Chronic kidney disease, stage 4 (severe): Secondary | ICD-10-CM

## 2020-05-25 DIAGNOSIS — N2581 Secondary hyperparathyroidism of renal origin: Secondary | ICD-10-CM | POA: Diagnosis not present

## 2020-05-25 DIAGNOSIS — E1122 Type 2 diabetes mellitus with diabetic chronic kidney disease: Secondary | ICD-10-CM | POA: Diagnosis not present

## 2020-05-25 DIAGNOSIS — R809 Proteinuria, unspecified: Secondary | ICD-10-CM | POA: Diagnosis not present

## 2020-05-25 DIAGNOSIS — E785 Hyperlipidemia, unspecified: Secondary | ICD-10-CM | POA: Diagnosis not present

## 2020-05-25 DIAGNOSIS — I5020 Unspecified systolic (congestive) heart failure: Secondary | ICD-10-CM | POA: Diagnosis not present

## 2020-05-25 DIAGNOSIS — I10 Essential (primary) hypertension: Secondary | ICD-10-CM | POA: Diagnosis not present

## 2020-05-25 DIAGNOSIS — I2510 Atherosclerotic heart disease of native coronary artery without angina pectoris: Secondary | ICD-10-CM

## 2020-05-25 NOTE — Patient Instructions (Signed)
Medication Instructions:  Your physician recommends that you continue on your current medications as directed. Please refer to the Current Medication list given to you today.  *If you need a refill on your cardiac medications before your next appointment, please call your pharmacy*  Lab Work: none If you have labs (blood work) drawn today and your tests are completely normal, you will receive your results only by: Marland Kitchen MyChart Message (if you have MyChart) OR . A paper copy in the mail If you have any lab test that is abnormal or we need to change your treatment, we will call you to review the results.  Testing/Procedures: Your physician has requested that you have an echocardiogram at the end of February. Echocardiography is a painless test that uses sound waves to create images of your heart. It provides your doctor with information about the size and shape of your heart and how well your heart's chambers and valves are working. This procedure takes approximately one hour. There are no restrictions for this procedure. There is a possibility that an IV may need to be started during your test to inject an image enhancing agent. This is done to obtain more optimal pictures of your heart. Therefore we ask that you do at least drink some water prior to coming in to hydrate your veins.   Follow-Up: At [facility], you and your health needs are our priority.  As part of our continuing mission to provide you with exceptional heart care, we have created designated Provider Care Teams.  These Care Teams include your primary Cardiologist (physician) and Advanced Practice Providers (APPs -  Physician Assistants and Nurse Practitioners) who all work together to provide you with the care you need, when you need it.  We recommend signing up for the patient portal called "MyChart".  Sign up information is provided on this After Visit Summary.  MyChart is used to connect with patients for Virtual Visits  (Telemedicine).  Patients are able to view lab/test results, encounter notes, upcoming appointments, etc.  Non-urgent messages can be sent to your provider as well.   To learn more about what you can do with MyChart, go to [email or web address].    Your next appointment:   6 week(s)  The format for your next appointment:   In Person  Provider:   You may see Nelva Bush, MD or one of the following Advanced Practice Providers on your designated Care Team:    Murray Hodgkins, NP  Christell Faith, PA-C  Marrianne Mood, PA-C  Cadence Tazewell, [place]  Laurann Montana, NP

## 2020-05-25 NOTE — Progress Notes (Signed)
Follow-up Outpatient Visit Date: 05/25/2020  Primary Care Provider: Birdie Sons, MD [address] [place] [address]  Chief Complaint: Follow-up CAD and heart failure  HPI:  Margaret Payne is a 80 y.o. 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  She was last seen in our office in early December by Laurann Montana, NP, at which time she reported improving shortness of breath.  Today, Margaret Payne reports that she is feeling fairly well without any episodes of chest pain.  She still fatigues easily and also has occasional exertional dyspnea.  She notes that her activity is primarily limited by knee pain due to arthritis.  She has not had any palpitations, lightheadedness, or edema.  She is unsure if she has orthopnea as she chronically sleeps in a lift chair as this is more comfortable than lying in bed.  She has experienced a couple episodes of sudden weakness during which she feels like her blood sugar is low.  However, she checks her CBG and notes it to be normal.  She wonders if her symptoms could be due to transient low blood pressure.  She has been compliant with her medications and denies bleeding remaining on aspirin and ticagrelor.  She is scheduled for labs to be drawn later today in anticipation of nephrology appointment early next week.  --------------------------------------------------------------------------------------------------  Past Medical History:  Diagnosis Date  . Arthritis   . CHF (congestive heart failure) ([place])   . Chronic kidney disease   . Diabetes mellitus without complication ([place])    type 2  . Headache    migrains  . HOH (hard of hearing)   . Hyperlipidemia   . Hypertension    Past Surgical History:  Procedure  Laterality Date  . ABDOMINAL HYSTERECTOMY    . BREAST BIOPSY    . CARDIAC CATHETERIZATION    . CATARACT EXTRACTION W/PHACO Right 10/03/2014   Procedure: CATARACT EXTRACTION PHACO AND INTRAOCULAR LENS PLACEMENT (IOC);  Surgeon: Birder Robson, MD;  Location: [facility];  Service: Ophthalmology;  Laterality: Right;  [place] 00:57 AP% 16.7 CDE 9.52 Fluid pack [id number]  . 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   Procedure: CORONARY STENT INTERVENTION;  Surgeon: Nelva Bush, MD;  Location: [facility];  Service: Cardiovascular;  Laterality: N/A;  . INJECTION KNEE    . LEFT HEART CATH AND CORONARY ANGIOGRAPHY N/A 03/13/2020   Procedure: LEFT HEART CATH AND CORONARY ANGIOGRAPHY;  Surgeon: Nelva Bush, MD;  Location: [facility];  Service: Cardiovascular;  Laterality: N/A;    Current Meds  Medication Sig  . acetaminophen (TYLENOL) 500 MG tablet Take 500 mg by mouth every 6 (six) hours as needed.  Marland Kitchen aspirin EC 81 MG EC tablet Take 1 tablet (81 mg total) by mouth daily. Swallow whole.  Marland Kitchen atorvastatin (LIPITOR) 80 MG tablet Take 1 tablet (80 mg total) by mouth daily.  . carvedilol (COREG) 3.125 MG tablet Take 1 tablet (3.125 mg total) by mouth 2 (two) times daily with a meal.  . Cholecalciferol (VITAMIN D3) 5000 units TABS Take 5,000 Units by mouth daily.  . Dulaglutide (TRULICITY) 1.5 EX/5.1ZG SOPN Inject into the skin once a week.   . insulin lispro protamine-lispro (HUMALOG 75/25 MIX) (75-25) 100 UNIT/ML SUSP injection Inject into the  skin 2 (two) times daily with a meal. 40 units in AM and 20 units in PM  . lisinopril (ZESTRIL) 20 MG tablet Take 1 tablet (20 mg total) by mouth daily.  . Multiple Vitamin (MULTI-VITAMINS) TABS Take by mouth. One a day vitamin- womens health  . ticagrelor (BRILINTA) 90 MG TABS tablet Take 1 tablet (90 mg total) by mouth 2 (two) times daily.    Allergies: Codeine sulfate  Social History   Tobacco Use  . Smoking status: Never  Smoker  . Smokeless tobacco: Never Used  Vaping Use  . Vaping Use: Never used  Substance Use Topics  . Alcohol use: No    Alcohol/week: 0.0 standard drinks  . Drug use: No    Family History  Problem Relation Age of Onset  . Breast cancer Mother   . Emphysema Father   . Breast cancer Sister 70    Review of Systems: A 12-system review of systems was performed and was negative except as noted in the HPI.  --------------------------------------------------------------------------------------------------  Physical Exam: BP 104/60 (BP Location: Left Arm, Patient Position: Sitting, Cuff Size: Normal)   Pulse 74   Ht 5' (1.524 m)   Wt 163 lb 4 oz (74 kg)   SpO2 98%   BMI 31.88 kg/m   General:  NAD. Neck: No JVD or HJR. Lungs: Clear to auscultation bilaterally without wheezes or crackles. Heart: Regular rate and rhythm without murmurs, rubs, or gallops. Abdomen: Soft, nontender, nondistended. Extremities: No lower extremity edema.  EKG: Normal sinus rhythm without abnormality.  Compared with prior tracing from 03/21/2020, anterolateral T wave inversions have resolved.  Lab Results  Component Value Date   WBC 9.6 03/14/2020   HGB 11.4 (L) 03/14/2020   HCT 34.1 (L) 03/14/2020   MCV 89.7 03/14/2020   PLT 246 03/14/2020    Lab Results  Component Value Date   NA 140 03/14/2020   K 3.9 03/14/2020   CL 107 03/14/2020   CO2 23 03/14/2020   BUN 21 03/14/2020   CREATININE 1.55 (H) 03/14/2020   GLUCOSE 205 (H) 03/14/2020   ALT 22 03/13/2020    Lab Results  Component Value Date   CHOL 170 03/13/2020   HDL 39 (L) 03/13/2020   LDLCALC 91 03/13/2020   TRIG 198 (H) 03/13/2020   CHOLHDL 4.4 03/13/2020    --------------------------------------------------------------------------------------------------  ASSESSMENT AND PLAN: Coronary artery disease: No angina reported following PCI to LAD in 02/2020 in the setting of NSTEMI.  We will plan to continue aspirin and  ticagrelor for up to 12 months.  Chronic HFrEF: Ms. Skiff appears euvolemic on examination today and reports NYHA class II-III symptoms, similar to her last visit.  Her soft blood pressure precludes escalation of goal-directed medical therapy; we will continue current doses of carvedilol and lisinopril.  We will plan to repeat a limited echo at the [facility] of this month, as it will have been 3 months since her MI/revascularization.  She is due for follow-up chemistries through nephrology later today.  Hyperlipidemia associated with type 2 diabetes mellitus: LDL was mildly elevated at the time of MI in November.  Continue atorvastatin 80 mg daily with plans for follow-up lipid panel when the patient returns for follow-up.  Chronic kidney disease stage IV: Continue close follow-up with nephrology.  Unless her renal function worsened significantly, I would like to continue with lisinopril in the setting of HFrEF.  Repeat labs scheduled for later today through Dr. Assunta Gambles office.  Follow-up: Return to clinic in 6  weeks.  Nelva Bush, MD 05/25/2020 10:13 AM

## 2020-05-28 DIAGNOSIS — D631 Anemia in chronic kidney disease: Secondary | ICD-10-CM | POA: Diagnosis not present

## 2020-05-28 DIAGNOSIS — I129 Hypertensive chronic kidney disease with stage 1 through stage 4 chronic kidney disease, or unspecified chronic kidney disease: Secondary | ICD-10-CM | POA: Diagnosis not present

## 2020-05-28 DIAGNOSIS — E1122 Type 2 diabetes mellitus with diabetic chronic kidney disease: Secondary | ICD-10-CM | POA: Diagnosis not present

## 2020-05-28 DIAGNOSIS — R809 Proteinuria, unspecified: Secondary | ICD-10-CM | POA: Diagnosis not present

## 2020-05-28 DIAGNOSIS — N2581 Secondary hyperparathyroidism of renal origin: Secondary | ICD-10-CM | POA: Diagnosis not present

## 2020-05-28 DIAGNOSIS — N184 Chronic kidney disease, stage 4 (severe): Secondary | ICD-10-CM | POA: Diagnosis not present

## 2020-06-14 ENCOUNTER — Ambulatory Visit (INDEPENDENT_AMBULATORY_CARE_PROVIDER_SITE_OTHER): Payer: HMO

## 2020-06-14 ENCOUNTER — Other Ambulatory Visit: Payer: Self-pay

## 2020-06-14 DIAGNOSIS — I5020 Unspecified systolic (congestive) heart failure: Secondary | ICD-10-CM

## 2020-06-14 LAB — ECHOCARDIOGRAM LIMITED: S' Lateral: 1.6 cm

## 2020-07-04 ENCOUNTER — Other Ambulatory Visit: Payer: Self-pay | Admitting: Family

## 2020-07-04 ENCOUNTER — Ambulatory Visit: Payer: HMO | Admitting: Family

## 2020-07-04 DIAGNOSIS — I25118 Atherosclerotic heart disease of native coronary artery with other forms of angina pectoris: Secondary | ICD-10-CM

## 2020-07-06 ENCOUNTER — Ambulatory Visit: Payer: HMO | Admitting: Family

## 2020-07-10 DIAGNOSIS — M1712 Unilateral primary osteoarthritis, left knee: Secondary | ICD-10-CM | POA: Diagnosis not present

## 2020-07-20 ENCOUNTER — Other Ambulatory Visit: Payer: Self-pay

## 2020-07-20 ENCOUNTER — Ambulatory Visit (INDEPENDENT_AMBULATORY_CARE_PROVIDER_SITE_OTHER): Payer: HMO | Admitting: Family

## 2020-07-20 ENCOUNTER — Encounter: Payer: Self-pay | Admitting: Family

## 2020-07-20 VITALS — BP 108/62 | HR 78 | Ht 61.0 in | Wt 161.0 lb

## 2020-07-20 DIAGNOSIS — I2510 Atherosclerotic heart disease of native coronary artery without angina pectoris: Secondary | ICD-10-CM | POA: Diagnosis not present

## 2020-07-20 DIAGNOSIS — E1121 Type 2 diabetes mellitus with diabetic nephropathy: Secondary | ICD-10-CM

## 2020-07-20 DIAGNOSIS — I5020 Unspecified systolic (congestive) heart failure: Secondary | ICD-10-CM

## 2020-07-20 DIAGNOSIS — I255 Ischemic cardiomyopathy: Secondary | ICD-10-CM | POA: Diagnosis not present

## 2020-07-20 DIAGNOSIS — N1832 Chronic kidney disease, stage 3b: Secondary | ICD-10-CM

## 2020-07-20 DIAGNOSIS — Z794 Long term (current) use of insulin: Secondary | ICD-10-CM

## 2020-07-20 DIAGNOSIS — N184 Chronic kidney disease, stage 4 (severe): Secondary | ICD-10-CM

## 2020-07-20 NOTE — Progress Notes (Signed)
Office Visit    Patient Name: Margaret Payne Date of Encounter: 07/20/2020  Primary Care Provider:  Birdie Sons, MD Primary Cardiologist:  Nelva Bush, MD Electrophysiologist:  None   Chief Complaint    Margaret Payne is a 80 y.o. female with a hx of CAD s/p DES to LAD 03/13/20, HFrEF secondary to ICM , DM2, HTN, HLD, CKD III, obesity presents today for follow-up after echocardiogram  Past Medical History    Past Medical History:  Diagnosis Date  . Arthritis   . CHF (congestive heart failure) ([place])   . Chronic kidney disease   . Diabetes mellitus without complication ([place])    type 2  . Headache    migrains  . HOH (hard of hearing)   . Hyperlipidemia   . Hypertension    Past Surgical History:  Procedure Laterality Date  . ABDOMINAL HYSTERECTOMY    . BREAST BIOPSY    . CARDIAC CATHETERIZATION    . CATARACT EXTRACTION W/PHACO Right 10/03/2014   Procedure: CATARACT EXTRACTION PHACO AND INTRAOCULAR LENS PLACEMENT (IOC);  Surgeon: Birder Robson, MD;  Location: [facility];  Service: Ophthalmology;  Laterality: Right;  [place] 00:57 AP% 16.7 CDE 9.52 Fluid pack OTL#[id number] H  . 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   Procedure: CORONARY STENT INTERVENTION;  Surgeon: Nelva Bush, MD;  Location: [place] [facility];  Service: Cardiovascular;  Laterality: N/A;  . INJECTION KNEE    . LEFT HEART CATH AND CORONARY ANGIOGRAPHY N/A 03/13/2020   Procedure: LEFT HEART CATH AND CORONARY ANGIOGRAPHY;  Surgeon: Nelva Bush, MD;  Location: [place] [facility];  Service: Cardiovascular;  Laterality: N/A;    Allergies  Allergies  Allergen Reactions  . Codeine Sulfate Nausea Only    History of Present Illness    Margaret Payne is a 80 y.o. female with a hx of CAD s/p DES to LAD 03/13/20, HFrEF secondary to ICM with recovered LVEF, DM2, HTN, HLD, CKD III, obesity last seen 05/25/2020  [facility]ed to [facility]/12[facility]20 with chest pain diagnosed with NSTEMI. Underwent LHC  03/13/20 with severe single vessel CAD with sequential 99% ? 70% LAD stenosis trested with DES. Also mild to moderate nonobstructive CAD to ostial LAD, ramus intermedius, distal LCx, prox/mid RCA. Echo 03/13/20 LVEF 40-45%, wall motion abnormalities, gr1DD, RV normal size and function, no significant valvular abnormalities. She was recommended for DAPT x12 months and Pravastatin transitioned to Atorvastatin.   She was seen in hospital follow-up 03/2020 doing overall well from cardiac perspective and had made significant changes to her diet.  Lipid panel 03/2020 with LDL 42.  She was seen in follow-up 05/25/2020 by Dr. Saunders Revel and recommended for repeat echocardiogram as it has been 3 months for her coronary intervention.  Subsequent echocardiogram 06/14/2020 showed normalized LVEF 60 to 65%, no wall motion abnormalities, normal LV global longitudinal strain, RV normal size and function, no significant valvular abnormalities.  She presents today for follow-up with her daughter.  She continues to follow a low-salt, carbohydrate modified diet in the setting of her heart failure and diabetes.  We reviewed her echocardiogram and she was very pleased with the results.  She tells me she has not been exercising quite as much but plans to start a walking regimen as it warms up and is hopeful her grandchildren who are in their teen years will go walking with her.  She did just have a knee injection last week and tells me this normally offers good relief.  Notes that she  is not having episodes of weakness that she described to Dr. Saunders Revel at last visit.  She also reports improvement in her fatigue and dyspnea. Reports no chest pain, pressure, or tightness. No edema, orthopnea, PND. Reports no palpitations.     EKGs/Labs/Other Studies Reviewed:   The following studies were reviewed today: Echo 06/14/20 1. Left ventricular ejection fraction, by estimation, is 60 to 65%. The  left ventricle has normal function. The left ventricle  has no regional  wall motion abnormalities. The average left ventricular global  longitudinal strain is -20.2 %. The global  longitudinal strain is normal.   2. Right ventricular systolic function is normal. The right ventricular  size is normal.   3. The mitral valve is normal in structure. No evidence of mitral valve  regurgitation.   4. The aortic valve was not well visualized.   5. The inferior vena cava is normal in size with greater than 50%  respiratory variability, suggesting right atrial pressure of 3 mmHg.   Echo 03/13/20  1. Left ventricular ejection fraction, by estimation, is 40 to 45%. The  left ventricle has mildly decreased function. The left ventricle  demonstrates regional wall motion abnormalities (see scoring  diagram/findings for description). Left ventricular  diastolic parameters are consistent with Grade I diastolic dysfunction  (impaired relaxation).   2. Right ventricular systolic function is normal. The right ventricular  size is normal.   3. The mitral valve was not well visualized. No evidence of mitral valve  regurgitation. No evidence of mitral stenosis.   4. The aortic valve was not well visualized. Aortic valve regurgitation  is not visualized. No aortic stenosis is present.   LHC 03/13/20 Conclusions: 1. Severe single-vessel coronary artery disease with sequential 99% and 70% mid LAD stenoses.  Mild to moderate, nonobstructive CAD also noted in the ostial LAD, ramus intermedius, distal LCx, and proximal/mid RCA. 2. Normal left ventricular filling pressure. 3. Successful PCI to sequential mid LAD stenoses using resolute Onyx 2.5 x 34 mm drug-eluting stent with 0% residual stenosis and TIMI-3 flow.   Recommendations: 1. Dual antiplatelet therapy with aspirin and ticagrelor for at least 12 months. 2. Aggressive secondary prevention of coronary artery disease. 3. Gentle post catheterization hydration. 4. Follow-up transthoracic echocardiogram with  escalation of goal-directed medical therapy for likely reduced LVEF (left ventriculogram not performed in the setting of CKD).   EKG: No EKG today  Recent Labs: 03/13/2020: ALT 22; Magnesium 1.9 03/14/2020: BUN 21; Creatinine, Ser 1.55; Hemoglobin 11.4; Platelets 246; Potassium 3.9; Sodium 140  Recent Lipid Panel    Component Value Date/Time   CHOL 170 03/13/2020 0510   TRIG 198 (H) 03/13/2020 0510   HDL 39 (L) 03/13/2020 0510   CHOLHDL 4.4 03/13/2020 0510   VLDL 40 03/13/2020 0510   LDLCALC 91 03/13/2020 0510    Home Medications   Current Meds  Medication Sig  . acetaminophen (TYLENOL) 500 MG tablet Take 500 mg by mouth every 6 (six) hours as needed.  Marland Kitchen aspirin EC 81 MG EC tablet Take 1 tablet (81 mg total) by mouth daily. Swallow whole.  Marland Kitchen atorvastatin (LIPITOR) 80 MG tablet Take 1 tablet (80 mg total) by mouth daily.  . carvedilol (COREG) 3.125 MG tablet TAKE 1 TABLET BY MOUTH TWICE DAILY WITH MEALS  . Cholecalciferol (VITAMIN D3) 5000 units TABS Take 5,000 Units by mouth daily.  . Dulaglutide (TRULICITY) 1.5 XT/0.6YI SOPN Inject into the skin once a week.   . insulin lispro protamine-lispro (HUMALOG 75/25 MIX) (  75-25) 100 UNIT/ML SUSP injection Inject into the skin 2 (two) times daily with a meal. 40 units in AM and 20 units in PM  . lisinopril (ZESTRIL) 20 MG tablet Take 1 tablet (20 mg total) by mouth daily.  . Multiple Vitamin (MULTI-VITAMINS) TABS Take by mouth. One a day vitamin- womens health  . ticagrelor (BRILINTA) 90 MG TABS tablet Take 1 tablet (90 mg total) by mouth 2 (two) times daily.     Review of Systems  All other systems reviewed and are otherwise negative except as noted above.  Physical Exam    VS:  BP 108/62 (BP Location: Left Arm, Patient Position: Sitting, Cuff Size: Normal)   Pulse 78   Ht 5\' 1"  (1.549 m)   Wt 161 lb (73 kg)   SpO2 98%   BMI 30.42 kg/m  , BMI Body mass index is 30.42 kg/m.  Wt Readings from Last 3 Encounters:  07/20/20 161  lb (73 kg)  05/25/20 163 lb 4 oz (74 kg)  05/02/20 164 lb 4 oz (74.5 kg)    GEN: Well nourished, overweight, well developed, in no acute distress. HEENT: normal. Neck: Supple, no JVD, carotid bruits, or masses. Cardiac: RRR, no murmurs, rubs, or gallops. No clubbing, cyanosis, edema.  Radials//PT 2+ and equal bilaterally.  Respiratory:  Respirations regular and unlabored, clear to auscultation bilaterally. GI: Soft, nontender, nondistended. MS: No deformity or atrophy. Skin: Warm and dry, no rash.  Neuro:  Strength and sensation are intact. Psych: Normal affect.  Assessment & Plan    1. CAD s/p DES to LAD 03/13/20 -stable with no anginal symptoms.GDMT includes DAPT aspirin/Brilinta for 1 year from intervention, Coreg, atorvastatin.  Heart healthy diet and regular cardiovascular exercise encouraged.  2. HFrEF secondary to ICM - EF 03/13/20 with EF 40-45% and anterior WMA.  Repeat echo 06/14/2020 with normalized LVEF 60-65%.  Euvolemic well compensated on exam.  GDMT includes aspirin, Brilinta, atorvastatin, Coreg, lisinopril.  Continue low-sodium, healthy diet.  3. HTN - BP well controlled. Continue current antihypertensive regimen.  Blood pressure is low normal today in clinic but she denies lightheadedness, dizziness as such we will maintain her Coreg 3.125 mg twice daily, lisinopril 20 mg daily.  If she becomes symptomatic with hypotension recommend reducing her lisinopril dose.  4. HLD, LDL goal <70 - 03/2020 LDL 42.  Continue atorvastatin 80 mg daily.  5. DM2 - Continue to follow with PCP.  6. CKD IIIb - Careful titration of diuretics and antihypertensives.  Continue to follow with nephrology.  7. Obesity -weight loss via diet and exercise encouraged. Discussed implications of weight on CAD, HF, DM2.   Disposition: Follow up in 3-4 month(s) with Dr. Saunders Revel or APP   Signed, Loel Dubonnet, NP 07/20/2020, 11:28 AM [place]

## 2020-07-20 NOTE — Patient Instructions (Addendum)
Medication Instructions:  Continue your current medications.   *If you need a refill on your cardiac medications before your next appointment, please call your pharmacy*   Lab Work: None ordered today.    Testing/Procedures: Your echocardiogram showed your heart pumping functino was back to normal.    Follow-Up: At [facility], you and your health needs are our priority.  As part of our continuing mission to provide you with exceptional heart care, we have created designated Provider Care Teams.  These Care Teams include your primary Cardiologist (physician) and Advanced Practice Providers (APPs -  Physician Assistants and Nurse Practitioners) who all work together to provide you with the care you need, when you need it.  We recommend signing up for the patient portal called "MyChart".  Sign up information is provided on this After Visit Summary.  MyChart is used to connect with patients for Virtual Visits (Telemedicine).  Patients are able to view lab/test results, encounter notes, upcoming appointments, etc.  Non-urgent messages can be sent to your provider as well.   To learn more about what you can do with MyChart, go to [email or web address].    Your next appointment:   3-4 month(s)  The format for your next appointment:   In Person  Provider:   You may see Nelva Bush, MD or one of the following Advanced Practice Providers on your designated Care Team:    Murray Hodgkins, NP  Christell Faith, PA-C  Marrianne Mood, PA-C  Cadence Kathlen Mody, [place]  Laurann Montana, NP  Other Instructions  Heart Healthy Diet Recommendations: A low-salt diet is recommended. Meats should be grilled, baked, or boiled. Avoid fried foods. Focus on lean protein sources like fish or chicken with vegetables and fruits. The American Heart Association is a Microbiologist!  American Heart Association Diet and Lifeystyle Recommendations   Exercise recommendations: The American Heart Association  recommends 150 minutes of moderate intensity exercise weekly. Try 30 minutes of moderate intensity exercise 4-5 times per week. This could include walking, jogging, or swimming.

## 2020-07-25 DIAGNOSIS — E1169 Type 2 diabetes mellitus with other specified complication: Secondary | ICD-10-CM | POA: Diagnosis not present

## 2020-07-25 DIAGNOSIS — E1122 Type 2 diabetes mellitus with diabetic chronic kidney disease: Secondary | ICD-10-CM | POA: Diagnosis not present

## 2020-07-25 DIAGNOSIS — E11319 Type 2 diabetes mellitus with unspecified diabetic retinopathy without macular edema: Secondary | ICD-10-CM | POA: Diagnosis not present

## 2020-07-25 DIAGNOSIS — E1129 Type 2 diabetes mellitus with other diabetic kidney complication: Secondary | ICD-10-CM | POA: Diagnosis not present

## 2020-07-25 DIAGNOSIS — E1159 Type 2 diabetes mellitus with other circulatory complications: Secondary | ICD-10-CM | POA: Diagnosis not present

## 2020-07-25 DIAGNOSIS — Z794 Long term (current) use of insulin: Secondary | ICD-10-CM | POA: Diagnosis not present

## 2020-07-25 DIAGNOSIS — E785 Hyperlipidemia, unspecified: Secondary | ICD-10-CM | POA: Diagnosis not present

## 2020-07-25 DIAGNOSIS — N1832 Chronic kidney disease, stage 3b: Secondary | ICD-10-CM | POA: Diagnosis not present

## 2020-07-25 DIAGNOSIS — R809 Proteinuria, unspecified: Secondary | ICD-10-CM | POA: Diagnosis not present

## 2020-07-31 ENCOUNTER — Other Ambulatory Visit: Payer: Self-pay | Admitting: Family

## 2020-07-31 DIAGNOSIS — I25118 Atherosclerotic heart disease of native coronary artery with other forms of angina pectoris: Secondary | ICD-10-CM

## 2020-07-31 NOTE — Telephone Encounter (Signed)
Rx request sent to pharmacy.  

## 2020-08-29 ENCOUNTER — Other Ambulatory Visit: Payer: Self-pay | Admitting: Family

## 2020-08-29 DIAGNOSIS — N2581 Secondary hyperparathyroidism of renal origin: Secondary | ICD-10-CM | POA: Diagnosis not present

## 2020-08-29 DIAGNOSIS — I25118 Atherosclerotic heart disease of native coronary artery with other forms of angina pectoris: Secondary | ICD-10-CM

## 2020-08-29 DIAGNOSIS — D631 Anemia in chronic kidney disease: Secondary | ICD-10-CM | POA: Diagnosis not present

## 2020-08-29 DIAGNOSIS — E1122 Type 2 diabetes mellitus with diabetic chronic kidney disease: Secondary | ICD-10-CM | POA: Diagnosis not present

## 2020-08-29 DIAGNOSIS — I129 Hypertensive chronic kidney disease with stage 1 through stage 4 chronic kidney disease, or unspecified chronic kidney disease: Secondary | ICD-10-CM | POA: Diagnosis not present

## 2020-08-29 DIAGNOSIS — N184 Chronic kidney disease, stage 4 (severe): Secondary | ICD-10-CM | POA: Diagnosis not present

## 2020-08-29 DIAGNOSIS — R809 Proteinuria, unspecified: Secondary | ICD-10-CM | POA: Diagnosis not present

## 2020-09-03 DIAGNOSIS — N184 Chronic kidney disease, stage 4 (severe): Secondary | ICD-10-CM | POA: Diagnosis not present

## 2020-09-03 DIAGNOSIS — R809 Proteinuria, unspecified: Secondary | ICD-10-CM | POA: Diagnosis not present

## 2020-09-03 DIAGNOSIS — I10 Essential (primary) hypertension: Secondary | ICD-10-CM | POA: Diagnosis not present

## 2020-09-03 DIAGNOSIS — D631 Anemia in chronic kidney disease: Secondary | ICD-10-CM | POA: Diagnosis not present

## 2020-09-03 DIAGNOSIS — N2581 Secondary hyperparathyroidism of renal origin: Secondary | ICD-10-CM | POA: Diagnosis not present

## 2020-09-03 DIAGNOSIS — E1122 Type 2 diabetes mellitus with diabetic chronic kidney disease: Secondary | ICD-10-CM | POA: Diagnosis not present

## 2020-09-06 DIAGNOSIS — H2513 Age-related nuclear cataract, bilateral: Secondary | ICD-10-CM | POA: Diagnosis not present

## 2020-09-06 LAB — HM DIABETES EYE EXAM

## 2020-10-01 ENCOUNTER — Ambulatory Visit (INDEPENDENT_AMBULATORY_CARE_PROVIDER_SITE_OTHER): Payer: HMO | Admitting: Family Medicine

## 2020-10-01 ENCOUNTER — Other Ambulatory Visit: Payer: Self-pay

## 2020-10-01 VITALS — BP 113/59 | HR 78 | Temp 98.3°F | Resp 16 | Wt 161.6 lb

## 2020-10-01 DIAGNOSIS — N184 Chronic kidney disease, stage 4 (severe): Secondary | ICD-10-CM | POA: Diagnosis not present

## 2020-10-01 DIAGNOSIS — E1121 Type 2 diabetes mellitus with diabetic nephropathy: Secondary | ICD-10-CM | POA: Diagnosis not present

## 2020-10-01 DIAGNOSIS — Z1211 Encounter for screening for malignant neoplasm of colon: Secondary | ICD-10-CM | POA: Diagnosis not present

## 2020-10-01 DIAGNOSIS — Z289 Immunization not carried out for unspecified reason: Secondary | ICD-10-CM | POA: Diagnosis not present

## 2020-10-01 DIAGNOSIS — Z794 Long term (current) use of insulin: Secondary | ICD-10-CM | POA: Diagnosis not present

## 2020-10-01 DIAGNOSIS — N2581 Secondary hyperparathyroidism of renal origin: Secondary | ICD-10-CM

## 2020-10-01 DIAGNOSIS — I25118 Atherosclerotic heart disease of native coronary artery with other forms of angina pectoris: Secondary | ICD-10-CM | POA: Diagnosis not present

## 2020-10-01 MED ORDER — SHINGRIX 50 MCG/0.5ML IM SUSR: 0.5000 mL | Freq: Once | INTRAMUSCULAR | 0 refills | Status: AC

## 2020-10-01 NOTE — Progress Notes (Signed)
Established patient visit   Patient: Margaret Payne   DOB: 1940-11-14   80 y.o. Female  MRN: [id number] Visit Date: 10/01/2020  Today's healthcare provider: Lelon Huh, MD   Chief Complaint  Patient presents with   Follow-up   Subjective    HPI  Diabetes Mellitus Type II, Follow-up   Lab Results  Component Value Date   HGBA1C 7.7 (H) 03/13/2020   HGBA1C 7.7 04/22/2018   HGBA1C 8.1 09/22/2017   07/25/2020 Hemoglobin A1C 4.2 - 5.6 % 8.1 High     Wt Readings from Last 3 Encounters:  10/01/20 161 lb 9.6 oz (73.3 kg)  07/20/20 161 lb (73 kg)  05/25/20 163 lb 4 oz (74 kg)   Last seen for diabetes 6 months ago.  Management since then includes none,follow up Dr. Nilda Simmer as scheduled. She reports excellent compliance with treatment. She is not having side effects.  Symptoms: No fatigue No foot ulcerations  No appetite changes No nausea  No paresthesia of the feet  No polydipsia  No polyuria No visual disturbances   No vomiting     Home blood sugar records: fasting range: 155  Episodes of hypoglycemia? Yes patient reports one episode > 400 after getting knee injection.    Current insulin regiment: Humalog 75/25  100units inject twice a day, 40units AM 20units PM  Most Recent Eye Exam: 09/06/2020 Current exercise: patient reports that she is doing light walking around yard and when she goes to [employer]   Pertinent Labs: Lab Results  Component Value Date   CHOL 170 03/13/2020   HDL 39 (L) 03/13/2020   LDLCALC 91 03/13/2020   TRIG 198 (H) 03/13/2020   CHOLHDL 4.4 03/13/2020   Lab Results  Component Value Date   NA 140 03/14/2020   K 3.9 03/14/2020   CREATININE 1.55 (H) 03/14/2020   GFRNONAA 34 (L) 03/14/2020   GLUCOSE 205 (H) 03/14/2020     ---------------------------------------------------------------------------------------------------  Follow up for Coronary Artery Disease  The patient was last seen for this 6 months ago. Changes made at last  visit include none, Asymptomatic. Compliant with medication.  Continue aggressive risk factor modification..  She reports excellent compliance with treatment. She feels that condition is Unchanged. She is not having side effects.   -----------------------------------------------------------------------------------------   Allergies  Allergen Reactions   Codeine Sulfate Nausea Only       Medications: Outpatient Medications Prior to Visit  Medication Sig   acetaminophen (TYLENOL) 500 MG tablet Take 500 mg by mouth every 6 (six) hours as needed.   aspirin EC 81 MG EC tablet Take 1 tablet (81 mg total) by mouth daily. Swallow whole.   atorvastatin (LIPITOR) 80 MG tablet Take 1 tablet (80 mg total) by mouth daily.   carvedilol (COREG) 3.125 MG tablet TAKE 1 TABLET BY MOUTH TWICE DAILY WITH MEALS   Cholecalciferol (VITAMIN D3) 5000 units TABS Take 5,000 Units by mouth daily.   Dulaglutide (TRULICITY) 1.5 QZ/0.0PQ SOPN Inject into the skin once a week.    insulin lispro protamine-lispro (HUMALOG 75/25 MIX) (75-25) 100 UNIT/ML SUSP injection Inject into the skin 2 (two) times daily with a meal. 40 units in AM and 20 units in PM   lisinopril (ZESTRIL) 20 MG tablet Take 1 tablet (20 mg total) by mouth daily.   Multiple Vitamin (MULTI-VITAMINS) TABS Take by mouth. One a day vitamin- womens health   ticagrelor (BRILINTA) 90 MG TABS tablet Take 1 tablet (90 mg total) by mouth 2 (  two) times daily.   No facility-administered medications prior to visit.    Review of Systems  Constitutional:  Negative for appetite change, chills, fatigue and fever.  Respiratory:  Negative for chest tightness and shortness of breath.   Cardiovascular:  Negative for chest pain and palpitations.  Gastrointestinal:  Negative for abdominal pain, nausea and vomiting.  Neurological:  Negative for dizziness and weakness.      Objective    BP (!) 113/59   Pulse 78   Temp 98.3 F (36.8 C) (Oral)   Resp 16   Wt  161 lb 9.6 oz (73.3 kg)   SpO2 98%   BMI 30.53 kg/m     Physical Exam   General: Appearance:    Obese female in no acute distress  Eyes:    PERRL, conjunctiva/corneas clear, EOM's intact       Lungs:     Clear to auscultation bilaterally, respirations unlabored  Heart:    Normal heart rate. Normal rhythm. No murmurs, rubs, or gallops.    MS:   All extremities are intact.    Neurologic:   Awake, alert, oriented x 3. No apparent focal neurological           defect.        Assessment & Plan     1. Coronary artery disease of native artery of native heart with stable angina pectoris (HCC) Asymptomatic. Compliant with medication.  Continue aggressive risk factor modification.  Continue routine follow up Dr. Saunders Revel as scheduled.   2. Secondary hyperparathyroidism of renal origin ([place]) Stable with current management per Dr. Juleen China  3. Chronic kidney disease, stage IV (severe) (HCC) Continue follow up Dr. Juleen China as scheduled.   4. Type 2 diabetes mellitus with diabetic nephropathy, with long-term current use of insulin (HCC) A1c not quite to goal. Continue working on diet and current medications. Follow up Dr. Gabriel Carina as scheduled.   5. Colon cancer screening  - Cologuard  6. Prescription for Shingrix. Vaccine not administered in office.   - Zoster Vaccine Adjuvanted [facility]) injection; Inject 0.5 mLs into the muscle once for 1 dose.  Dispense: 0.5 mL; Refill: 0   Future Appointments  Date Time Provider [place]  10/24/2020  2:40 PM End, Harrell Gave, MD [facility]  10/31/2020  2:00 PM Alisa Graff, FNP [facility] None  12/04/2020  9:40 AM [facility]-NURSE HEALTH ADVISOR [facility]  04/03/2021 10:00 AM Caryn Section Kirstie Peri, MD [facility]         The entirety of the information documented in the History of Present Illness, Review of Systems and Physical Exam were personally obtained by me. Portions of this information were initially documented by the CMA and reviewed by  me for thoroughness and accuracy.     Lelon Huh, MD  [facility] [phone] (phone) [phone] (fax)  [place]

## 2020-10-04 ENCOUNTER — Other Ambulatory Visit: Payer: Self-pay | Admitting: Family

## 2020-10-04 DIAGNOSIS — I25118 Atherosclerotic heart disease of native coronary artery with other forms of angina pectoris: Secondary | ICD-10-CM

## 2020-10-04 DIAGNOSIS — I255 Ischemic cardiomyopathy: Secondary | ICD-10-CM

## 2020-10-04 DIAGNOSIS — E785 Hyperlipidemia, unspecified: Secondary | ICD-10-CM

## 2020-10-15 DIAGNOSIS — Z1211 Encounter for screening for malignant neoplasm of colon: Secondary | ICD-10-CM | POA: Diagnosis not present

## 2020-10-16 DIAGNOSIS — M170 Bilateral primary osteoarthritis of knee: Secondary | ICD-10-CM | POA: Diagnosis not present

## 2020-10-16 DIAGNOSIS — M1712 Unilateral primary osteoarthritis, left knee: Secondary | ICD-10-CM | POA: Diagnosis not present

## 2020-10-16 LAB — COLOGUARD: Cologuard: POSITIVE — AB

## 2020-10-20 LAB — COLOGUARD: COLOGUARD: POSITIVE — AB

## 2020-10-24 ENCOUNTER — Ambulatory Visit: Payer: HMO | Admitting: Internal Medicine

## 2020-10-24 ENCOUNTER — Telehealth: Payer: Self-pay

## 2020-10-24 NOTE — Telephone Encounter (Signed)
Dr. Caryn Section do you remember whether you received a paper fax on this patient's cologuard results? I see the results are in the chart, but I cant tell if they were manually abstracted or reconciled from an outside source.

## 2020-10-24 NOTE — Telephone Encounter (Signed)
Copied from [place] [id number]. Topic: General - Other >> Oct 24, 2020 10:34 AM Celene Kras wrote: Reason for CRM: Di Kindle, from [employer], called stating that they faxed over a abnormal cologuard result. She states that she will need the Providers NPI & Case # [id number] if office does not receive this fax today. Please advise.    Callback # [phone] [id number]

## 2020-10-24 NOTE — Telephone Encounter (Signed)
Yes, I received a faxed copy and sent to medical records to scan. Result note has been sent.

## 2020-10-25 ENCOUNTER — Telehealth: Payer: Self-pay

## 2020-10-25 DIAGNOSIS — R195 Other fecal abnormalities: Secondary | ICD-10-CM

## 2020-10-25 NOTE — Telephone Encounter (Signed)
-----   Message from Birdie Sons, MD sent at 10/24/2020 12:07 PM EDT ----- Cologuard test is positive. She needs referral to GI.

## 2020-10-25 NOTE — Telephone Encounter (Signed)
Patient advised. Referral placed to GI

## 2020-10-30 ENCOUNTER — Ambulatory Visit: Payer: HMO | Admitting: Family

## 2020-10-31 ENCOUNTER — Encounter: Payer: Self-pay | Admitting: *Deleted

## 2020-10-31 ENCOUNTER — Ambulatory Visit: Payer: HMO | Attending: Family | Admitting: Family

## 2020-10-31 ENCOUNTER — Encounter: Payer: Self-pay | Admitting: Family

## 2020-10-31 ENCOUNTER — Other Ambulatory Visit: Payer: Self-pay

## 2020-10-31 VITALS — BP 105/53 | HR 80 | Resp 14 | Ht 60.0 in | Wt 157.1 lb

## 2020-10-31 DIAGNOSIS — Z794 Long term (current) use of insulin: Secondary | ICD-10-CM | POA: Diagnosis not present

## 2020-10-31 DIAGNOSIS — E1121 Type 2 diabetes mellitus with diabetic nephropathy: Secondary | ICD-10-CM

## 2020-10-31 DIAGNOSIS — E1122 Type 2 diabetes mellitus with diabetic chronic kidney disease: Secondary | ICD-10-CM | POA: Diagnosis not present

## 2020-10-31 DIAGNOSIS — I130 Hypertensive heart and chronic kidney disease with heart failure and stage 1 through stage 4 chronic kidney disease, or unspecified chronic kidney disease: Secondary | ICD-10-CM | POA: Diagnosis not present

## 2020-10-31 DIAGNOSIS — I509 Heart failure, unspecified: Secondary | ICD-10-CM | POA: Diagnosis not present

## 2020-10-31 DIAGNOSIS — E785 Hyperlipidemia, unspecified: Secondary | ICD-10-CM | POA: Diagnosis not present

## 2020-10-31 DIAGNOSIS — R0602 Shortness of breath: Secondary | ICD-10-CM | POA: Diagnosis not present

## 2020-10-31 DIAGNOSIS — Z7901 Long term (current) use of anticoagulants: Secondary | ICD-10-CM | POA: Diagnosis not present

## 2020-10-31 DIAGNOSIS — N189 Chronic kidney disease, unspecified: Secondary | ICD-10-CM | POA: Diagnosis not present

## 2020-10-31 DIAGNOSIS — E11319 Type 2 diabetes mellitus with unspecified diabetic retinopathy without macular edema: Secondary | ICD-10-CM | POA: Diagnosis not present

## 2020-10-31 DIAGNOSIS — Z7982 Long term (current) use of aspirin: Secondary | ICD-10-CM | POA: Diagnosis not present

## 2020-10-31 DIAGNOSIS — I2510 Atherosclerotic heart disease of native coronary artery without angina pectoris: Secondary | ICD-10-CM | POA: Diagnosis not present

## 2020-10-31 DIAGNOSIS — M1990 Unspecified osteoarthritis, unspecified site: Secondary | ICD-10-CM | POA: Insufficient documentation

## 2020-10-31 DIAGNOSIS — I5032 Chronic diastolic (congestive) heart failure: Secondary | ICD-10-CM

## 2020-10-31 DIAGNOSIS — Z79899 Other long term (current) drug therapy: Secondary | ICD-10-CM | POA: Insufficient documentation

## 2020-10-31 DIAGNOSIS — N1832 Chronic kidney disease, stage 3b: Secondary | ICD-10-CM

## 2020-10-31 DIAGNOSIS — I10 Essential (primary) hypertension: Secondary | ICD-10-CM

## 2020-10-31 NOTE — Progress Notes (Signed)
Patient ID: Margaret Payne, female    DOB: Dec 05, 1940, 80 y.o.   MRN: [id number]  HPI  Margaret Payne is a 81 y/o female with a history of DM, hyperlipidemia, HTN, CKD, arthritis and chronic heart failure.   Echo report from 06/14/20 reviewed and showed an EF of 60-65%. Echo report from 03/13/20 reviewed and showed an EF of 40-45%.  Cath done 03/13/20 and showed: Severe single-vessel coronary artery disease with sequential 99% and 70% mid LAD stenoses.  Mild to moderate, nonobstructive CAD also noted in the ostial LAD, ramus intermedius, distal LCx, and proximal/mid RCA. Normal left ventricular filling pressure. Successful PCI to sequential mid LAD stenoses using resolute Onyx 2.5 x 34 mm drug-eluting stent with 0% residual stenosis and TIMI-3 flow.  Has not been admitted or been in the ED in the last 6 months  She presents today for a follow-up visit with a chief complaint of minimal shortness of breath upon moderate exertion. She describes this as chronic in nature having been present for several years. She has associated fatigue, easy bruising, difficulty sleeping and weight loss along with this. She denies any abdominal distention, palpitations, pedal edema, chest pain, dizziness or weight gain.   Past Medical History:  Diagnosis Date   Arthritis    CHF (congestive heart failure) (HCC)    Chronic kidney disease    Diabetes mellitus without complication ([place])    type 2   Headache    migrains   HOH (hard of hearing)    Hyperlipidemia    Hypertension    Past Surgical History:  Procedure Laterality Date   ABDOMINAL HYSTERECTOMY     BREAST BIOPSY     CARDIAC CATHETERIZATION     CATARACT EXTRACTION W/PHACO Right 10/03/2014   Procedure: CATARACT EXTRACTION PHACO AND INTRAOCULAR LENS PLACEMENT ([place]);  Surgeon: Birder Robson, MD;  Location: [facility];  Service: Ophthalmology;  Laterality: Right;  [place] 00:57 AP% 16.7 CDE 9.52 Fluid pack [id number]   CORONARY STENT INTERVENTION N/A  03/13/2020   Procedure: CORONARY STENT INTERVENTION;  Surgeon: Nelva Bush, MD;  Location: [facility];  Service: Cardiovascular;  Laterality: N/A;   INJECTION KNEE     LEFT HEART CATH AND CORONARY ANGIOGRAPHY N/A 03/13/2020   Procedure: LEFT HEART CATH AND CORONARY ANGIOGRAPHY;  Surgeon: Nelva Bush, MD;  Location: [facility];  Service: Cardiovascular;  Laterality: N/A;   Family History  Problem Relation Age of Onset   Breast cancer Mother    Emphysema Father    Breast cancer Sister 25   Social History   Tobacco Use   Smoking status: Never   Smokeless tobacco: Never  Substance Use Topics   Alcohol use: No    Alcohol/week: 0.0 standard drinks   Allergies  Allergen Reactions   Codeine Sulfate Nausea Only   Prior to Admission medications   Medication Sig Start Date End Date Taking? Authorizing Provider  acetaminophen (TYLENOL) 500 MG tablet Take 500 mg by mouth every 6 (six) hours as needed.   Yes [provider]  aspirin EC 81 MG EC tablet Take 1 tablet (81 mg total) by mouth daily. Swallow whole. 03/15/20  Yes Donne Hazel, MD  atorvastatin (LIPITOR) 80 MG tablet Take 1 tablet by mouth once daily 10/04/20  Yes End, Harrell Gave, MD  carvedilol (COREG) 3.125 MG tablet TAKE 1 TABLET BY MOUTH TWICE DAILY WITH MEALS 08/29/20  Yes Loel Dubonnet, NP  Cholecalciferol (VITAMIN D3) 5000 units TABS Take 5,000 Units by  mouth daily.   Yes [provider]  Dulaglutide (TRULICITY) 1.5 SW/9.6PR SOPN Inject into the skin once a week.    Yes [provider]  insulin lispro protamine-lispro (HUMALOG 75/25 MIX) (75-25) 100 UNIT/ML SUSP injection Inject into the skin 2 (two) times daily with a meal. 40 units in AM and 20 units in PM   Yes [provider]  lisinopril (ZESTRIL) 20 MG tablet Take 1 tablet by mouth once daily 10/04/20  Yes End, Harrell Gave, MD  Multiple Vitamin (MULTI-VITAMINS) TABS Take by mouth. One a day vitamin-  womens health   Yes [provider]  ticagrelor (BRILINTA) 90 MG TABS tablet Take 1 tablet (90 mg total) by mouth 2 (two) times daily. 05/02/20 04/27/21 Yes Loel Dubonnet, NP   Review of Systems  Constitutional:  Positive for fatigue. Negative for appetite change.  HENT:  Negative for congestion, postnasal drip and sore throat.   Eyes: Negative.   Respiratory:  Positive for shortness of breath. Negative for cough and chest tightness.   Cardiovascular:  Negative for chest pain, palpitations and leg swelling.  Gastrointestinal:  Negative for abdominal distention and abdominal pain.  Endocrine: Negative.   Genitourinary: Negative.   Musculoskeletal:  Negative for back pain and neck pain.  Skin: Negative.   Allergic/Immunologic: Negative.   Neurological:  Negative for dizziness and light-headedness.  Hematological:  Negative for adenopathy. Bruises/bleeds easily.  Psychiatric/Behavioral:  Positive for sleep disturbance. Negative for dysphoric mood. The patient is not nervous/anxious.    Vitals:   10/31/20 1403  BP: (!) 105/53  Pulse: 80  Resp: 14  SpO2: 100%  Weight: 157 lb 2 oz (71.3 kg)  Height: 5' (1.524 m)   Wt Readings from Last 3 Encounters:  10/31/20 157 lb 2 oz (71.3 kg)  10/01/20 161 lb 9.6 oz (73.3 kg)  07/20/20 161 lb (73 kg)   Lab Results  Component Value Date   CREATININE 1.55 (H) 03/14/2020   CREATININE 1.60 (H) 03/13/2020   CREATININE 1.71 (H) 03/12/2020    Physical Exam Vitals and nursing note reviewed. Exam conducted with a chaperone present (daughter).  Constitutional:      Appearance: Normal appearance.  HENT:     Head: Normocephalic and atraumatic.  Cardiovascular:     Rate and Rhythm: Normal rate and regular rhythm.  Pulmonary:     Effort: Pulmonary effort is normal. No respiratory distress.     Breath sounds: No wheezing or rales.  Abdominal:     General: There is no distension.     Palpations: Abdomen is soft.  Musculoskeletal:         General: No tenderness.     Cervical back: Normal range of motion and neck supple.     Right lower leg: No edema.     Left lower leg: No edema.  Skin:    General: Skin is warm and dry.  Neurological:     General: No focal deficit present.     Mental Status: She is alert and oriented to person, place, and time.  Psychiatric:        Mood and Affect: Mood normal.        Behavior: Behavior normal.        Thought Content: Thought content normal.    Assessment & Plan:  1: Chronic heart failure with now preserved ejection fraction without structural changes- - NYHA class II - euvolemic today - weighing daily; reminded to call for an overnight weight gain of > 2 pounds  or a weekly weight gain of > 5 pounds - weight down 7 pounds from last visit here 6 months ago - not adding salt to her food and has been trying to read food labels for sodium content; says that she's been eating more fresh vegetables - saw cardiology Gilford Rile) 07/20/20 - current BP does not allow for changing lisinopril to entresto  2: HTN- - BP looks good although on the low side (105/53) - saw PCP Caryn Section) 10/01/20 - BMP 08/29/20 reviewed and showed sodium 139, potassium 4.3, creatinine 2.08 and GFR 22  3: CKD- - saw nephrology (K[place]uru) 09/03/20  4: DM- - saw endocrinology (Butters) 07/25/20 - A1c 07/25/20 was 8.1% - glucose at home today was 178   Medication bottles reviewed.   Due to HF stability, will not make a return appointment for patient at this time. Advised patient that she could call back for any questions or to make another appointment and she was comfortable with this plan.

## 2020-10-31 NOTE — Patient Instructions (Addendum)
Continue weighing daily and call for an overnight weight gain of > 2 pounds or a weekly weight gain of >5 pounds.   Call us in the future if you'd like to make another appointment 

## 2020-11-07 DIAGNOSIS — N184 Chronic kidney disease, stage 4 (severe): Secondary | ICD-10-CM | POA: Diagnosis not present

## 2020-11-07 DIAGNOSIS — E1159 Type 2 diabetes mellitus with other circulatory complications: Secondary | ICD-10-CM | POA: Diagnosis not present

## 2020-11-07 DIAGNOSIS — R809 Proteinuria, unspecified: Secondary | ICD-10-CM | POA: Diagnosis not present

## 2020-11-07 DIAGNOSIS — E11319 Type 2 diabetes mellitus with unspecified diabetic retinopathy without macular edema: Secondary | ICD-10-CM | POA: Diagnosis not present

## 2020-11-07 DIAGNOSIS — E1169 Type 2 diabetes mellitus with other specified complication: Secondary | ICD-10-CM | POA: Diagnosis not present

## 2020-11-07 DIAGNOSIS — E1129 Type 2 diabetes mellitus with other diabetic kidney complication: Secondary | ICD-10-CM | POA: Diagnosis not present

## 2020-11-07 DIAGNOSIS — Z794 Long term (current) use of insulin: Secondary | ICD-10-CM | POA: Diagnosis not present

## 2020-11-07 DIAGNOSIS — E1122 Type 2 diabetes mellitus with diabetic chronic kidney disease: Secondary | ICD-10-CM | POA: Diagnosis not present

## 2020-11-07 DIAGNOSIS — E785 Hyperlipidemia, unspecified: Secondary | ICD-10-CM | POA: Diagnosis not present

## 2020-11-11 ENCOUNTER — Other Ambulatory Visit: Payer: Self-pay | Admitting: Internal Medicine

## 2020-11-11 DIAGNOSIS — I255 Ischemic cardiomyopathy: Secondary | ICD-10-CM

## 2020-11-11 DIAGNOSIS — I25118 Atherosclerotic heart disease of native coronary artery with other forms of angina pectoris: Secondary | ICD-10-CM

## 2020-11-11 DIAGNOSIS — E785 Hyperlipidemia, unspecified: Secondary | ICD-10-CM

## 2020-12-03 DIAGNOSIS — D631 Anemia in chronic kidney disease: Secondary | ICD-10-CM | POA: Diagnosis not present

## 2020-12-03 DIAGNOSIS — I10 Essential (primary) hypertension: Secondary | ICD-10-CM | POA: Diagnosis not present

## 2020-12-03 DIAGNOSIS — E1122 Type 2 diabetes mellitus with diabetic chronic kidney disease: Secondary | ICD-10-CM | POA: Diagnosis not present

## 2020-12-03 DIAGNOSIS — R809 Proteinuria, unspecified: Secondary | ICD-10-CM | POA: Diagnosis not present

## 2020-12-03 DIAGNOSIS — N184 Chronic kidney disease, stage 4 (severe): Secondary | ICD-10-CM | POA: Diagnosis not present

## 2020-12-03 DIAGNOSIS — N2581 Secondary hyperparathyroidism of renal origin: Secondary | ICD-10-CM | POA: Diagnosis not present

## 2020-12-05 DIAGNOSIS — N184 Chronic kidney disease, stage 4 (severe): Secondary | ICD-10-CM | POA: Diagnosis not present

## 2020-12-05 DIAGNOSIS — N2581 Secondary hyperparathyroidism of renal origin: Secondary | ICD-10-CM | POA: Diagnosis not present

## 2020-12-05 DIAGNOSIS — I129 Hypertensive chronic kidney disease with stage 1 through stage 4 chronic kidney disease, or unspecified chronic kidney disease: Secondary | ICD-10-CM | POA: Diagnosis not present

## 2020-12-05 DIAGNOSIS — R809 Proteinuria, unspecified: Secondary | ICD-10-CM | POA: Diagnosis not present

## 2020-12-05 DIAGNOSIS — D631 Anemia in chronic kidney disease: Secondary | ICD-10-CM | POA: Diagnosis not present

## 2020-12-05 DIAGNOSIS — E1122 Type 2 diabetes mellitus with diabetic chronic kidney disease: Secondary | ICD-10-CM | POA: Diagnosis not present

## 2020-12-19 ENCOUNTER — Ambulatory Visit (INDEPENDENT_AMBULATORY_CARE_PROVIDER_SITE_OTHER): Payer: HMO | Admitting: Internal Medicine

## 2020-12-19 ENCOUNTER — Other Ambulatory Visit: Payer: Self-pay

## 2020-12-19 ENCOUNTER — Encounter: Payer: Self-pay | Admitting: Internal Medicine

## 2020-12-19 VITALS — BP 112/58 | HR 67 | Ht 60.0 in | Wt 159.1 lb

## 2020-12-19 DIAGNOSIS — E785 Hyperlipidemia, unspecified: Secondary | ICD-10-CM

## 2020-12-19 DIAGNOSIS — I2510 Atherosclerotic heart disease of native coronary artery without angina pectoris: Secondary | ICD-10-CM | POA: Diagnosis not present

## 2020-12-19 DIAGNOSIS — I255 Ischemic cardiomyopathy: Secondary | ICD-10-CM

## 2020-12-19 DIAGNOSIS — N1832 Chronic kidney disease, stage 3b: Secondary | ICD-10-CM

## 2020-12-19 DIAGNOSIS — E1169 Type 2 diabetes mellitus with other specified complication: Secondary | ICD-10-CM

## 2020-12-19 DIAGNOSIS — I10 Essential (primary) hypertension: Secondary | ICD-10-CM | POA: Diagnosis not present

## 2020-12-19 MED ORDER — LISINOPRIL 10 MG PO TABS: 10.0000 mg | ORAL_TABLET | Freq: Every day | ORAL | 3 refills | Status: DC

## 2020-12-19 NOTE — Patient Instructions (Signed)
Medication Instructions:   Your physician has recommended you make the following change in your medication:   1) DECREASE Lisinopril 10 mg daily  -  A new Rx has been sent to your pharmacy.  -  You may cut current tablet in half until you fill new Rx.   2) You may STOP Brilinta at your 1 year anniversary 03/12/21  3) CONTINUE taking Aspirin 81 mg daily after stopping Brilinta  *If you need a refill on your cardiac medications before your next appointment, please call your pharmacy*   Lab Work:  None ordered  Testing/Procedures:  None ordered   Follow-Up: At [facility], you and your health needs are our priority.  As part of our continuing mission to provide you with exceptional heart care, we have created designated Provider Care Teams.  These Care Teams include your primary Cardiologist (physician) and Advanced Practice Providers (APPs -  Physician Assistants and Nurse Practitioners) who all work together to provide you with the care you need, when you need it.  We recommend signing up for the patient portal called "MyChart".  Sign up information is provided on this After Visit Summary.  MyChart is used to connect with patients for Virtual Visits (Telemedicine).  Patients are able to view lab/test results, encounter notes, upcoming appointments, etc.  Non-urgent messages can be sent to your provider as well.   To learn more about what you can do with MyChart, go to [email or web address].    Your next appointment:   6 month(s)  The format for your next appointment:   In Person  Provider:   You may see Nelva Bush, MD or one of the following Advanced Practice Providers on your designated Care Team:   Murray Hodgkins, NP Christell Faith, PA-C Marrianne Mood, PA-C Cadence [place], [place]

## 2020-12-19 NOTE — Progress Notes (Signed)
Follow-up Outpatient Visit Date: 12/19/2020  Primary Care Provider: Birdie Sons, MD [address] [place] [address]  Chief Complaint: Follow-up coronary artery disease and cardiomyopathy  HPI:  Margaret Payne is a 80 y.o. 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  She was last seen in our office in April by Laurann Montana, NP.  She reported feeling a bit better with less weakness than at my last visit with her in February.  She did not have any chest pain.  Today, Margaret Payne reports that she feels relatively well.  She denies chest pain and shortness of breath.  She has occasional swelling in her feet that she attributes to eating more salt.  She had an episode of dizziness that occurred when she bent over in her kitchen in the spring.  She may have blacked out for a brief period of time.  Her daughter has been concerned about low blood pressures.  Margaret Payne is in need of a colonoscopy due to abnormal Cologuard; she and her daughter inquire about the timing of this in the setting of ongoing dual antiplatelet therapy.  --------------------------------------------------------------------------------------------------  Past Medical History:  Diagnosis Date   Arthritis    CHF (congestive heart failure) ([place])    Chronic kidney disease    Diabetes mellitus without complication ([place])    type 2   Headache    migrains   HOH (hard of hearing)    Hyperlipidemia    Hypertension    Past Surgical History:  Procedure Laterality Date   ABDOMINAL HYSTERECTOMY     BREAST BIOPSY     CARDIAC CATHETERIZATION     CATARACT EXTRACTION W/PHACO Right 10/03/2014   Procedure: CATARACT EXTRACTION  PHACO AND INTRAOCULAR LENS PLACEMENT ([place]);  Surgeon: Birder Robson, MD;  Location: [facility];  Service: Ophthalmology;  Laterality: Right;  [place] 00:57 AP% 16.7 CDE 9.52 Fluid pack [id number] H   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   Procedure: CORONARY STENT INTERVENTION;  Surgeon: Nelva Bush, MD;  Location: [facility];  Service: Cardiovascular;  Laterality: N/A;   INJECTION KNEE     LEFT HEART CATH AND CORONARY ANGIOGRAPHY N/A 03/13/2020   Procedure: LEFT HEART CATH AND CORONARY ANGIOGRAPHY;  Surgeon: Nelva Bush, MD;  Location: [facility];  Service: Cardiovascular;  Laterality: N/A;     Current Meds  Medication Sig   acetaminophen (TYLENOL) 500 MG tablet Take 500 mg by mouth every 6 (six) hours as needed.   aspirin EC 81 MG EC tablet Take 1 tablet (81 mg total) by mouth daily. Swallow whole.   atorvastatin (LIPITOR) 80 MG tablet Take 1 tablet by mouth once daily   carvedilol (COREG) 3.125 MG tablet TAKE 1 TABLET BY MOUTH TWICE DAILY WITH MEALS   Cholecalciferol (VITAMIN D3) 5000 units TABS Take 5,000 Units by mouth daily.   Dulaglutide 3 MG/0.5ML SOPN Inject into the skin once a week.   insulin lispro protamine-lispro (HUMALOG 75/25 MIX) (75-25) 100 UNIT/ML SUSP injection Inject into the skin 2 (two) times daily with a meal. 40 units in AM and 20 units in PM   lisinopril (ZESTRIL) 20 MG tablet Take 1 tablet by mouth once daily   Multiple  Vitamin (MULTI-VITAMINS) TABS Take by mouth. One a day vitamin- womens health   ticagrelor (BRILINTA) 90 MG TABS tablet Take 1 tablet (90 mg total) by mouth 2 (two) times daily.    Allergies: Codeine sulfate  Social History   Tobacco Use   Smoking status: Never   Smokeless tobacco: Never  Vaping Use   Vaping Use: Never used  Substance Use Topics   Alcohol use: No    Alcohol/week: 0.0 standard drinks   Drug use: No    Family History  Problem Relation Age of Onset   Breast cancer Mother    Emphysema  Father    Breast cancer Sister 11    Review of Systems: A 12-system review of systems was performed and was negative except as noted in the HPI.  --------------------------------------------------------------------------------------------------  Physical Exam: BP (!) 112/58 (BP Location: Right Arm, Patient Position: Sitting, Cuff Size: Normal)   Pulse 67   Ht 5' (1.524 m)   Wt 159 lb 2 oz (72.2 kg)   SpO2 99%   BMI 31.08 kg/m   General:  NAD. Neck: No JVD or HJR. Lungs: Clear to auscultation bilaterally without wheezes or crackles. Heart: Regular rate and rhythm without murmurs, rubs, or gallops. Abdomen: Soft, nontender, nondistended. Extremities: No lower extremity edema.  EKG: Normal sinus rhythm with anterior infarct.  Compared with prior tracing from 05/25/2020, anterior infarct is now present; query lead placement.  Lab Results  Component Value Date   WBC 9.6 03/14/2020   HGB 11.4 (L) 03/14/2020   HCT 34.1 (L) 03/14/2020   MCV 89.7 03/14/2020   PLT 246 03/14/2020    Lab Results  Component Value Date   NA 140 03/14/2020   K 3.9 03/14/2020   CL 107 03/14/2020   CO2 23 03/14/2020   BUN 21 03/14/2020   CREATININE 1.55 (H) 03/14/2020   GLUCOSE 205 (H) 03/14/2020   ALT 22 03/13/2020    Lab Results  Component Value Date   CHOL 170 03/13/2020   HDL 39 (L) 03/13/2020   LDLCALC 91 03/13/2020   TRIG 198 (H) 03/13/2020   CHOLHDL 4.4 03/13/2020    --------------------------------------------------------------------------------------------------  ASSESSMENT AND PLAN: Coronary artery disease without angina: 's [place] is doing well without recurrent angina status post PCI to the LAD in 02/2020.  I recommended that she continue 12 months of dual antiplatelet therapy, after which time ticagrelor will be discontinued.  I think it would be reasonable for her to undergo colonoscopy once she has been off ticagrelor for 5 days.  Continue carvedilol, aspirin, and atorvastatin  for secondary prevention.  Ischemic cardiomyopathy: Margaret Payne appears euvolemic and well compensated.  LVEF normalized on follow-up echo in 05/2020.  As above, we will decrease lisinopril.  Continue low-dose carvedilol.  Hypertension: Blood pressure borderline low today with intermittent orthostatic lightheadedness.  We have agreed to decrease lisinopril from 20 mg daily to 10 mg daily.  Hyperlipidemia associated with type 2 diabetes mellitus: Lipids well controlled on most recent check in 03/2020 (LDL 42, triglycerides 127).  Continue high intensity statin therapy.  Chronic kidney disease: Avoid nephrotoxic agents with ongoing follow-up through nephrology and Dr. Caryn Section.  As above, will decrease lisinopril due to orthostatic lightheadedness and low diastolic BP.  Follow-up: Return to clinic in 6 months.  Nelva Bush, MD 12/19/2020 10:59 AM

## 2021-01-10 ENCOUNTER — Ambulatory Visit: Payer: HMO | Admitting: Gastroenterology

## 2021-01-10 ENCOUNTER — Encounter: Payer: Self-pay | Admitting: Gastroenterology

## 2021-01-10 ENCOUNTER — Other Ambulatory Visit: Payer: Self-pay

## 2021-01-10 VITALS — BP 92/45 | HR 80 | Temp 98.4°F | Ht 60.0 in | Wt 159.0 lb

## 2021-01-10 DIAGNOSIS — D649 Anemia, unspecified: Secondary | ICD-10-CM

## 2021-01-10 DIAGNOSIS — R195 Other fecal abnormalities: Secondary | ICD-10-CM | POA: Diagnosis not present

## 2021-01-10 NOTE — Progress Notes (Signed)
Margaret Payne [address]  [place]  [place], [place] [address]  Main: [phone]  Fax: [phone]   Gastroenterology Consultation  Referring Provider:     Birdie Sons, MD Primary Care Physician:  Birdie Sons, MD Reason for Consultation:     Positive Cologuard        HPI:    Chief Complaint  Patient presents with   New Patient (Initial Visit)    Positive Cologuard, pt has never had colonoscopy and denies any family Hx of colon cancer or polyps    Margaret Payne is a 80 y.o. y/o female referred for consultation & management  by Dr. Caryn Section, Kirstie Peri, MD. patient was referred for positive Cologuard.  Patient reports having a Cologuard test 3 years ago that was negative, and repeat test this time was positive.  No prior colonoscopy.  The patient denies abdominal or flank pain, anorexia, nausea or vomiting, dysphagia, change in bowel habits or black or bloody stools or weight loss.   Past Medical History:  Diagnosis Date   Arthritis    CHF (congestive heart failure) (HCC)    Chronic kidney disease    Diabetes mellitus without complication ([place])    type 2   Headache    migrains   HOH (hard of hearing)    Hyperlipidemia    Hypertension     Past Surgical History:  Procedure Laterality Date   ABDOMINAL HYSTERECTOMY     BREAST BIOPSY     CARDIAC CATHETERIZATION     CATARACT EXTRACTION W/PHACO Right 10/03/2014   Procedure: CATARACT EXTRACTION PHACO AND INTRAOCULAR LENS PLACEMENT ([place]);  Surgeon: Birder Robson, MD;  Location: [facility];  Service: Ophthalmology;  Laterality: Right;  [place] 00:57 AP% 16.7 CDE 9.52 Fluid pack [id number] H   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   Procedure: CORONARY STENT INTERVENTION;  Surgeon: Nelva Bush, MD;  Location: [facility];  Service: Cardiovascular;  Laterality: N/A;   INJECTION KNEE     LEFT HEART CATH AND CORONARY ANGIOGRAPHY N/A 03/13/2020   Procedure: LEFT HEART CATH AND CORONARY  ANGIOGRAPHY;  Surgeon: Nelva Bush, MD;  Location: [facility];  Service: Cardiovascular;  Laterality: N/A;    Prior to Admission medications   Medication Sig Start Date End Date Taking? Authorizing Provider  acetaminophen (TYLENOL) 500 MG tablet Take 500 mg by mouth every 6 (six) hours as needed.   Yes [provider]  aspirin EC 81 MG EC tablet Take 1 tablet (81 mg total) by mouth daily. Swallow whole. 03/15/20  Yes Donne Hazel, MD  atorvastatin (LIPITOR) 80 MG tablet Take 1 tablet by mouth once daily 11/12/20  Yes End, Harrell Gave, MD  carvedilol (COREG) 3.125 MG tablet TAKE 1 TABLET BY MOUTH TWICE DAILY WITH MEALS 08/29/20  Yes Loel Dubonnet, NP  Cholecalciferol (VITAMIN D3) 5000 units TABS Take 5,000 Units by mouth daily.   Yes [provider]  Dulaglutide 3 MG/0.5ML SOPN Inject into the skin once a week.   Yes [provider]  insulin lispro protamine-lispro (HUMALOG 75/25 MIX) (75-25) 100 UNIT/ML SUSP injection Inject into the skin 2 (two) times daily with a meal. 40 units in AM and 20 units in PM   Yes [provider]  lisinopril (ZESTRIL) 10 MG tablet Take 1 tablet (10 mg total) by mouth daily. 12/19/20 12/14/21 Yes End, Harrell Gave, MD  Multiple Vitamin (MULTI-VITAMINS) TABS Take by mouth. One a day vitamin- womens health   Yes [provider]  ticagrelor (BRILINTA) 90 MG TABS tablet Take 1 tablet (90 mg total) by mouth 2 (two) times daily. 05/02/20 04/27/21 Yes Loel Dubonnet, NP    Family History  Problem Relation Age of Onset   Breast cancer Mother    Emphysema Father    Breast cancer Sister 22     Social History   Tobacco Use   Smoking status: Never   Smokeless tobacco: Never  Vaping Use   Vaping Use: Never used  Substance Use Topics   Alcohol use: No    Alcohol/week: 0.0 standard drinks   Drug use: No    Allergies as of 01/10/2021 - Review Complete 01/10/2021  Allergen Reaction Noted   Codeine sulfate  Nausea Only 09/27/2014    Review of Systems:    All systems reviewed and negative except where noted in HPI.   Physical Exam:  Constitutional: General:   Alert,  Well-developed, well-nourished, pleasant and cooperative in NAD BP (!) 92/45   Pulse 80   Temp 98.4 F (36.9 C) (Oral)   Ht 5' (1.524 m)   Wt 159 lb (72.1 kg)   BMI 31.05 kg/m   Eyes:  Sclera clear, no icterus.   Conjunctiva pink. PERRLA  Ears:  No scars, lesions or masses, Normal auditory acuity. Nose:  No deformity, discharge, or lesions. Mouth:  No deformity or lesions, oropharynx pink & moist.  Neck:  Supple; no masses or thyromegaly.  Respiratory: Normal respiratory effort, Normal percussion  Gastrointestinal: Soft, non-tender and non-distended without masses, hepatosplenomegaly or hernias noted.  No guarding or rebound tenderness.     Cardiac: No clubbing or edema.  No cyanosis. Normal posterior tibial pedal pulses noted.  Lymphatic:  No significant cervical or axillary adenopathy.  Psych:  Alert and cooperative. Normal mood and affect.  Musculoskeletal:  Normal gait. Head normocephalic, atraumatic. Symmetrical without gross deformities. 5/5 Upper and Lower extremity strength bilaterally.  Skin: Warm. Intact without significant lesions or rashes. No jaundice.  Neurologic:  Face symmetrical, tongue midline, Normal sensation to touch;  grossly normal neurologically.  Psych:  Alert and oriented x3, Alert and cooperative. Normal mood and affect.   Labs: CBC    Component Value Date/Time   WBC 9.6 03/14/2020 0445   RBC 3.80 (L) 03/14/2020 0445   HGB 11.4 (L) 03/14/2020 0445   HCT 34.1 (L) 03/14/2020 0445   PLT 246 03/14/2020 0445   MCV 89.7 03/14/2020 0445   MCH 30.0 03/14/2020 0445   MCHC 33.4 03/14/2020 0445   RDW 13.7 03/14/2020 0445   CMP     Component Value Date/Time   NA 140 03/14/2020 0445   NA 142 04/22/2018 0000   K 3.9 03/14/2020 0445   CL 107 03/14/2020 0445   CO2 23 03/14/2020  0445   GLUCOSE 205 (H) 03/14/2020 0445   BUN 21 03/14/2020 0445   BUN 23 (A) 04/22/2018 0000   CREATININE 1.55 (H) 03/14/2020 0445   CALCIUM 8.8 (L) 03/14/2020 0445   PROT 6.7 03/13/2020 0510   ALBUMIN 3.6 03/13/2020 0510   AST 68 (H) 03/13/2020 0510   ALT 22 03/13/2020 0510   ALKPHOS 74 03/13/2020 0510   BILITOT 0.7 03/13/2020 0510   GFRNONAA 34 (L) 03/14/2020 0445   December 2021 labs AST 26, ALT 30, alk phos 119, albumin 3.9, total bilirubin 0.5  Imaging Studies: No results found.  Assessment and Plan:   Margaret Payne is a 80 y.o. y/o female has been referred for positive Cologuard test  with no prior colonoscopy  Colonoscopy indicated for positive Cologuard test We will need to obtain clearance from her cardiologist prior to the procedure as far as holding her Brilinta.  Clearance request will be sent to Dr. Saunders Revel.  Dr. Darnelle Bos last note from August 2022 reviewed and states that reasonable to undergo colonoscopy once she has been off medication for 5 days.  But will need to obtain official clearance request  Her blood work does show chronic normocytic anemia, with nephrology note stating anemia of chronic disease.  There are no recent iron labs.  Will obtain iron panel to evaluate for iron deficiency and if present, she may need EGD as well  Most recent transaminases were normal.  Alkaline phosphatase was mildly elevated.  Can repeat on future visits along with GGT.  Patient is otherwise asymptomatic from the standpoint.  Normal bilirubin and clinical symptoms suggests that there is no biliary obstruction  I have discussed alternative options, risks & benefits,  which include, but are not limited to, bleeding, infection, perforation,respiratory complication & drug reaction.  The patient agrees with this plan & written consent will be obtained.      Dr Margaret Payne  Speech recognition software was used to dictate the above note.

## 2021-01-11 LAB — IRON AND TIBC
Iron Saturation: 15 % (ref 15–55)
Iron: 52 ug/dL (ref 27–139)
Total Iron Binding Capacity: 355 ug/dL (ref 250–450)
UIBC: 303 ug/dL (ref 118–369)

## 2021-01-11 LAB — FERRITIN: Ferritin: 11 ng/mL — ABNORMAL LOW (ref 15–150)

## 2021-01-16 ENCOUNTER — Telehealth: Payer: Self-pay | Admitting: Internal Medicine

## 2021-01-16 NOTE — Telephone Encounter (Signed)
   [facility] Pre-operative Risk Assessment    Patient Name: Margaret Payne  DOB: 11/09/1940 MRN: [id number]  HEARTCARE STAFF:  - IMPORTANT!!!!!! Under Visit Info/Reason for Call, type in Other and utilize the format Clearance MM/DD/YY or Clearance TBD. Do not use dashes or single digits. - Please review there is not already an duplicate clearance open for this procedure. - If request is for dental extraction, please clarify the # of teeth to be extracted. - If the patient is currently at the dentist's office, call Pre-Op Callback Staff (MA/nurse) to input urgent request.  - If the patient is not currently in the dentist office, please route to the Pre-Op pool.  Request for surgical clearance:  What type of surgery is being performed? Colonoscopy/EGD  When is this surgery scheduled? TBD  What type of clearance is required (medical clearance vs. Pharmacy clearance to hold med vs. Both)? both  Are there any medications that need to be held prior to surgery and how long? Not listed, please advise if needed  Practice name and name of physician performing surgery? [facility]   What is the office phone number? [phone]   7.   What is the office fax number? [phone]  8.   Anesthesia type (None, local, MAC, general) ? General    Caryl Pina Gerringer 01/16/2021, 1:19 PM  _________________________________________________________________   (provider comments below)

## 2021-01-17 NOTE — Telephone Encounter (Signed)
    Patient Name: Margaret Payne  DOB: 02-08-1941 MRN: [id number]  Primary Cardiologist: Nelva Bush, MD  Chart reviewed as part of pre-operative protocol coverage.   Patient was seen by Dr. Saunders Revel 12/19/20. 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  I recommended that she continue 12 months of dual antiplatelet therapy, after which time ticagrelor will be discontinued.  I think it would be reasonable for her to undergo colonoscopy once she has been off ticagrelor for 5 days.  Continue carvedilol, aspirin, and atorvastatin for secondary prevention".    I will route this recommendation to the requesting party via Epic fax function and remove from pre-op pool.  Please call with questions.  [place], [place] 01/17/2021, 9:53 AM

## 2021-01-23 ENCOUNTER — Telehealth: Payer: Self-pay | Admitting: Gastroenterology

## 2021-01-23 NOTE — Telephone Encounter (Signed)
Pt. Daughter calling to get a copy of her moms test results. She said someone was supposed to call her and give her results of her moms test

## 2021-01-23 NOTE — Telephone Encounter (Signed)
I spoke to daughter, okay per advanced directives healthcare POA  Copy of labs left in front office for pick... she would like to hold off on referral and will wait until f/u with PCP next week to discuss further

## 2021-01-30 ENCOUNTER — Other Ambulatory Visit: Payer: Self-pay | Admitting: Internal Medicine

## 2021-01-30 DIAGNOSIS — E785 Hyperlipidemia, unspecified: Secondary | ICD-10-CM

## 2021-02-05 DIAGNOSIS — E11319 Type 2 diabetes mellitus with unspecified diabetic retinopathy without macular edema: Secondary | ICD-10-CM | POA: Diagnosis not present

## 2021-02-05 DIAGNOSIS — E1122 Type 2 diabetes mellitus with diabetic chronic kidney disease: Secondary | ICD-10-CM | POA: Diagnosis not present

## 2021-02-05 DIAGNOSIS — Z794 Long term (current) use of insulin: Secondary | ICD-10-CM | POA: Diagnosis not present

## 2021-02-05 DIAGNOSIS — N184 Chronic kidney disease, stage 4 (severe): Secondary | ICD-10-CM | POA: Diagnosis not present

## 2021-02-05 LAB — MICROALBUMIN, URINE: Microalb, Ur: 11

## 2021-02-05 LAB — HEMOGLOBIN A1C: Hemoglobin A1C: 7.2

## 2021-02-12 DIAGNOSIS — E785 Hyperlipidemia, unspecified: Secondary | ICD-10-CM | POA: Diagnosis not present

## 2021-02-12 DIAGNOSIS — Z794 Long term (current) use of insulin: Secondary | ICD-10-CM | POA: Diagnosis not present

## 2021-02-12 DIAGNOSIS — E1169 Type 2 diabetes mellitus with other specified complication: Secondary | ICD-10-CM | POA: Diagnosis not present

## 2021-02-12 DIAGNOSIS — E1129 Type 2 diabetes mellitus with other diabetic kidney complication: Secondary | ICD-10-CM | POA: Diagnosis not present

## 2021-02-12 DIAGNOSIS — E1122 Type 2 diabetes mellitus with diabetic chronic kidney disease: Secondary | ICD-10-CM | POA: Diagnosis not present

## 2021-02-12 DIAGNOSIS — E11319 Type 2 diabetes mellitus with unspecified diabetic retinopathy without macular edema: Secondary | ICD-10-CM | POA: Diagnosis not present

## 2021-02-12 DIAGNOSIS — E1159 Type 2 diabetes mellitus with other circulatory complications: Secondary | ICD-10-CM | POA: Diagnosis not present

## 2021-02-12 DIAGNOSIS — E782 Mixed hyperlipidemia: Secondary | ICD-10-CM | POA: Diagnosis not present

## 2021-02-12 DIAGNOSIS — N184 Chronic kidney disease, stage 4 (severe): Secondary | ICD-10-CM | POA: Diagnosis not present

## 2021-02-12 DIAGNOSIS — Z23 Encounter for immunization: Secondary | ICD-10-CM | POA: Diagnosis not present

## 2021-02-12 DIAGNOSIS — R809 Proteinuria, unspecified: Secondary | ICD-10-CM | POA: Diagnosis not present

## 2021-03-05 ENCOUNTER — Other Ambulatory Visit: Payer: Self-pay | Admitting: Family

## 2021-03-05 ENCOUNTER — Other Ambulatory Visit: Payer: Self-pay | Admitting: Cardiovascular Disease

## 2021-03-05 DIAGNOSIS — E785 Hyperlipidemia, unspecified: Secondary | ICD-10-CM

## 2021-03-05 DIAGNOSIS — I25118 Atherosclerotic heart disease of native coronary artery with other forms of angina pectoris: Secondary | ICD-10-CM

## 2021-03-08 DIAGNOSIS — M170 Bilateral primary osteoarthritis of knee: Secondary | ICD-10-CM | POA: Diagnosis not present

## 2021-03-18 DIAGNOSIS — N2581 Secondary hyperparathyroidism of renal origin: Secondary | ICD-10-CM | POA: Diagnosis not present

## 2021-03-18 DIAGNOSIS — D631 Anemia in chronic kidney disease: Secondary | ICD-10-CM | POA: Diagnosis not present

## 2021-03-18 DIAGNOSIS — E1122 Type 2 diabetes mellitus with diabetic chronic kidney disease: Secondary | ICD-10-CM | POA: Diagnosis not present

## 2021-03-18 DIAGNOSIS — R809 Proteinuria, unspecified: Secondary | ICD-10-CM | POA: Diagnosis not present

## 2021-03-18 DIAGNOSIS — N184 Chronic kidney disease, stage 4 (severe): Secondary | ICD-10-CM | POA: Diagnosis not present

## 2021-03-18 DIAGNOSIS — I129 Hypertensive chronic kidney disease with stage 1 through stage 4 chronic kidney disease, or unspecified chronic kidney disease: Secondary | ICD-10-CM | POA: Diagnosis not present

## 2021-03-20 DIAGNOSIS — D631 Anemia in chronic kidney disease: Secondary | ICD-10-CM | POA: Diagnosis not present

## 2021-03-20 DIAGNOSIS — E1122 Type 2 diabetes mellitus with diabetic chronic kidney disease: Secondary | ICD-10-CM | POA: Diagnosis not present

## 2021-03-20 DIAGNOSIS — N2581 Secondary hyperparathyroidism of renal origin: Secondary | ICD-10-CM | POA: Diagnosis not present

## 2021-03-20 DIAGNOSIS — Z794 Long term (current) use of insulin: Secondary | ICD-10-CM | POA: Diagnosis not present

## 2021-03-20 DIAGNOSIS — N184 Chronic kidney disease, stage 4 (severe): Secondary | ICD-10-CM | POA: Diagnosis not present

## 2021-03-20 DIAGNOSIS — E119 Type 2 diabetes mellitus without complications: Secondary | ICD-10-CM | POA: Diagnosis not present

## 2021-03-20 DIAGNOSIS — R809 Proteinuria, unspecified: Secondary | ICD-10-CM | POA: Diagnosis not present

## 2021-03-20 DIAGNOSIS — I129 Hypertensive chronic kidney disease with stage 1 through stage 4 chronic kidney disease, or unspecified chronic kidney disease: Secondary | ICD-10-CM | POA: Diagnosis not present

## 2021-03-26 ENCOUNTER — Encounter: Payer: Self-pay | Admitting: Gastroenterology

## 2021-03-26 ENCOUNTER — Ambulatory Visit (INDEPENDENT_AMBULATORY_CARE_PROVIDER_SITE_OTHER): Payer: HMO | Admitting: Gastroenterology

## 2021-03-26 VITALS — BP 105/62 | HR 76 | Temp 97.9°F | Wt 162.0 lb

## 2021-03-26 DIAGNOSIS — R195 Other fecal abnormalities: Secondary | ICD-10-CM | POA: Diagnosis not present

## 2021-03-26 MED ORDER — CLENPIQ 10-3.5-12 MG-GM -GM/160ML PO SOLN: 1.0000 | Freq: Once | ORAL | 0 refills | Status: AC

## 2021-03-26 NOTE — Progress Notes (Signed)
Vonda Antigua, MD [address]  [place], [place] [address]  Main: [phone]  Fax: [phone]   Primary Care Physician: Birdie Sons, MD   Chief complaint: Follow-up for positive Cologuard  HPI: Margaret Payne is a 80 y.o. female previously seen for positive Cologuard and colonoscopy was recommended.  EGD was recommended subsequently as labs showed iron deficiency as well.  However, patient and family did not schedule.  Patient presents with her daughter today.  Denies any blood in stool.  No abdominal pain.  No nausea or vomiting.  Good appetite.  No weight loss.  States has been off Brilinta as per Dr. Saunders Revel since November 2022   ROS: All ROS reviewed and negative except as per HPI   Past Medical History:  Diagnosis Date   Arthritis    CHF (congestive heart failure) ([place])    Chronic kidney disease    Diabetes mellitus without complication ([place])    type 2   Headache    migrains   HOH (hard of hearing)    Hyperlipidemia    Hypertension     Past Surgical History:  Procedure Laterality Date   ABDOMINAL HYSTERECTOMY     BREAST BIOPSY     CARDIAC CATHETERIZATION     CATARACT EXTRACTION W/PHACO Right 10/03/2014   Procedure: CATARACT EXTRACTION PHACO AND INTRAOCULAR LENS PLACEMENT ([place]);  Surgeon: Birder Robson, MD;  Location: [facility];  Service: Ophthalmology;  Laterality: Right;  [place] 00:57 AP% 16.7 CDE 9.52 Fluid pack [id number]   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   Procedure: CORONARY STENT INTERVENTION;  Surgeon: Nelva Bush, MD;  Location: [facility];  Service: Cardiovascular;  Laterality: N/A;   INJECTION KNEE     LEFT HEART CATH AND CORONARY ANGIOGRAPHY N/A 03/13/2020   Procedure: LEFT HEART CATH AND CORONARY ANGIOGRAPHY;  Surgeon: Nelva Bush, MD;  Location: [facility];  Service: Cardiovascular;  Laterality: N/A;    Prior to Admission medications   Medication Sig Start Date End Date Taking?  Authorizing Provider  acetaminophen (TYLENOL) 500 MG tablet Take 500 mg by mouth every 6 (six) hours as needed.   Yes [provider]  aspirin EC 81 MG EC tablet Take 1 tablet (81 mg total) by mouth daily. Swallow whole. 03/15/20  Yes Donne Hazel, MD  atorvastatin (LIPITOR) 80 MG tablet Take 1 tablet by mouth once daily 03/06/21  Yes Gollan, Kathlene November, MD  carvedilol (COREG) 3.125 MG tablet TAKE 1 TABLET BY MOUTH TWICE DAILY WITH MEALS 03/06/21  Yes Loel Dubonnet, NP  Cholecalciferol (VITAMIN D3) 5000 units TABS Take 5,000 Units by mouth daily.   Yes [provider]  Dulaglutide 3 MG/0.5ML SOPN Inject into the skin once a week.   Yes [provider]  Ferrous Sulfate (IRON) 325 (65 Fe) MG TABS Take by mouth.   Yes [provider]  insulin lispro protamine-lispro (HUMALOG 75/25 MIX) (75-25) 100 UNIT/ML SUSP injection Inject into the skin 2 (two) times daily with a meal. 40 units in AM and 20 units in PM   Yes [provider]  lisinopril (ZESTRIL) 10 MG tablet Take 1 tablet (10 mg total) by mouth daily. 12/19/20 12/14/21 Yes End, Harrell Gave, MD  Multiple Vitamin (MULTI-VITAMINS) TABS Take by mouth. One a day vitamin- womens health   Yes [provider]    Family History  Problem Relation Age of Onset   Breast cancer Mother    Emphysema Father  Breast cancer Sister 10     Social History   Tobacco Use   Smoking status: Never   Smokeless tobacco: Never  Vaping Use   Vaping Use: Never used  Substance Use Topics   Alcohol use: No    Alcohol/week: 0.0 standard drinks   Drug use: No    Allergies as of 03/26/2021 - Review Complete 03/26/2021  Allergen Reaction Noted   Codeine sulfate Nausea Only 09/27/2014    Physical Examination:  Constitutional: General:   Alert,  Well-developed, well-nourished, pleasant and cooperative in NAD BP 105/62   Pulse 76   Temp 97.9 F (36.6 C) (Oral)   Wt 162 lb (73.5 kg)   BMI 31.64 kg/m    Respiratory: Normal respiratory effort  Gastrointestinal:  Soft, non-tender and non-distended without masses, hepatosplenomegaly or hernias noted.  No guarding or rebound tenderness.     Cardiac: No clubbing or edema.  No cyanosis. Normal posterior tibial pedal pulses noted.  Psych:  Alert and cooperative. Normal mood and affect.  Musculoskeletal:  Normal gait. Head normocephalic, atraumatic. Symmetrical without gross deformities. 5/5 Lower extremity strength bilaterally.  Skin: Warm. Intact without significant lesions or rashes. No jaundice.  Neck: Supple, trachea midline  Lymph: No cervical lymphadenopathy  Psych:  Alert and oriented x3, Alert and cooperative. Normal mood and affect.  Labs: CMP     Component Value Date/Time   NA 140 03/14/2020 0445   NA 142 04/22/2018 0000   K 3.9 03/14/2020 0445   CL 107 03/14/2020 0445   CO2 23 03/14/2020 0445   GLUCOSE 205 (H) 03/14/2020 0445   BUN 21 03/14/2020 0445   BUN 23 (A) 04/22/2018 0000   CREATININE 1.55 (H) 03/14/2020 0445   CALCIUM 8.8 (L) 03/14/2020 0445   PROT 6.7 03/13/2020 0510   ALBUMIN 3.6 03/13/2020 0510   AST 68 (H) 03/13/2020 0510   ALT 22 03/13/2020 0510   ALKPHOS 74 03/13/2020 0510   BILITOT 0.7 03/13/2020 0510   GFRNONAA 34 (L) 03/14/2020 0445   Lab Results  Component Value Date   WBC 9.6 03/14/2020   HGB 11.4 (L) 03/14/2020   HCT 34.1 (L) 03/14/2020   MCV 89.7 03/14/2020   PLT 246 03/14/2020    Imaging Studies:   Assessment and Plan:   Margaret Payne is a 80 y.o. y/o female here for follow-up of positive Cologuard and iron deficiency anemia  EGD and colonoscopy again discussed Importance of doing the procedure in a timely fashion discussed as well  Patient and daughter prefer to schedule it in January 2023 as they state due to insurance issues, it will be better covered for them at that time.  They are aware that this procedure was recommended even on the last visit when she was seen in  September 2022 and since they did not get that done at that time it is already delayed, but prefer to have it done in January 2023 and understand the risks of delayed diagnosis for a positive Cologuard and iron deficiency anemia  Patient has had mild elevation in alkaline phosphatase previously.  Repeat on future visits along with GGT.  However, patient is otherwise asymptomatic and has normal bilirubin and no clinical symptoms to suggest biliary obstruction    Dr Vonda Antigua

## 2021-04-03 ENCOUNTER — Other Ambulatory Visit: Payer: Self-pay

## 2021-04-03 ENCOUNTER — Ambulatory Visit (INDEPENDENT_AMBULATORY_CARE_PROVIDER_SITE_OTHER): Payer: HMO | Admitting: Family Medicine

## 2021-04-03 ENCOUNTER — Encounter: Payer: Self-pay | Admitting: Family Medicine

## 2021-04-03 VITALS — BP 122/62 | HR 95 | Temp 99.0°F | Resp 16 | Ht 60.0 in | Wt 160.0 lb

## 2021-04-03 DIAGNOSIS — Z289 Immunization not carried out for unspecified reason: Secondary | ICD-10-CM | POA: Diagnosis not present

## 2021-04-03 DIAGNOSIS — N1832 Chronic kidney disease, stage 3b: Secondary | ICD-10-CM | POA: Diagnosis not present

## 2021-04-03 DIAGNOSIS — Z794 Long term (current) use of insulin: Secondary | ICD-10-CM | POA: Diagnosis not present

## 2021-04-03 DIAGNOSIS — E1121 Type 2 diabetes mellitus with diabetic nephropathy: Secondary | ICD-10-CM | POA: Diagnosis not present

## 2021-04-03 DIAGNOSIS — E559 Vitamin D deficiency, unspecified: Secondary | ICD-10-CM | POA: Diagnosis not present

## 2021-04-03 DIAGNOSIS — I10 Essential (primary) hypertension: Secondary | ICD-10-CM | POA: Diagnosis not present

## 2021-04-03 DIAGNOSIS — E1169 Type 2 diabetes mellitus with other specified complication: Secondary | ICD-10-CM | POA: Diagnosis not present

## 2021-04-03 DIAGNOSIS — Z0000 Encounter for general adult medical examination without abnormal findings: Secondary | ICD-10-CM | POA: Diagnosis not present

## 2021-04-03 DIAGNOSIS — R195 Other fecal abnormalities: Secondary | ICD-10-CM | POA: Diagnosis not present

## 2021-04-03 DIAGNOSIS — E785 Hyperlipidemia, unspecified: Secondary | ICD-10-CM

## 2021-04-03 MED ORDER — SHINGRIX 50 MCG/0.5ML IM SUSR: 0.5000 mL | Freq: Once | INTRAMUSCULAR | 0 refills | Status: AC

## 2021-04-03 NOTE — Patient Instructions (Signed)
Please review the attached list of medications and notify my office if there are any errors.   Call [facility] at [phone] to follow up on the abnormal Cologuard screening test.

## 2021-04-03 NOTE — Progress Notes (Signed)
Complete Physical Exam      Patient: Margaret Payne, Female    DOB: [birth year], 79 y.o.   MRN: [id number] Visit Date: 04/03/2021  Today's Provider: Lelon Huh, MD    Subjective    Margaret Payne is a 80 y.o. female who presents today for her complete physical examination. She reports consuming a general diet. The patient does not participate in regular exercise at present. She generally feels fairly well. She reports sleeping poorly. She does not have additional problems to discuss today.   She had positive Cologuard in June, but colonoscopy was deferred due to having MI in November of 2021. Her cardiologist has now cleared her for procedure which she states is scheduled for December. She is up to date with follow ups with Dr. Nilda Simmer for diabetes, last a1c was 7.2 on 02/05/2021.  HPI Sinus congestion: Patient complains of sinus congestion, PND and dry cough since yesterday. She denies fever, headache, shortness of breath, body aches chills or sweats.   Medications: Outpatient Medications Prior to Visit  Medication Sig   acetaminophen (TYLENOL) 500 MG tablet Take 500 mg by mouth every 6 (six) hours as needed.   aspirin EC 81 MG EC tablet Take 1 tablet (81 mg total) by mouth daily. Swallow whole.   atorvastatin (LIPITOR) 80 MG tablet Take 1 tablet by mouth once daily   carvedilol (COREG) 3.125 MG tablet TAKE 1 TABLET BY MOUTH TWICE DAILY WITH MEALS   Cholecalciferol (VITAMIN D3) 5000 units TABS Take 5,000 Units by mouth daily.   Dulaglutide 3 MG/0.5ML SOPN Inject into the skin once a week.   Ferrous Sulfate (IRON) 325 (65 Fe) MG TABS Take by mouth.   insulin lispro protamine-lispro (HUMALOG 75/25 MIX) (75-25) 100 UNIT/ML SUSP injection Inject into the skin 2 (two) times daily with a meal. 40 units in AM and 20 units in PM   lisinopril (ZESTRIL) 10 MG tablet Take 1 tablet (10 mg total) by mouth daily.   Multiple Vitamin (MULTI-VITAMINS) TABS Take by mouth. One a day vitamin-  womens health   No facility-administered medications prior to visit.    Allergies  Allergen Reactions   Codeine Sulfate Nausea Only    Patient Care Team: Birdie Sons, MD as PCP - General (Family Medicine) End, Harrell Gave, MD as PCP - Cardiology (Cardiology) Solum, Betsey Holiday, MD as Physician Assistant (Internal Medicine) Birder Robson, MD as Referring Physician (Ophthalmology) Lovell Sheehan, MD as Consulting Physician (Orthopedic Surgery) Lavonia Dana, MD as Consulting Physician (Internal Medicine)  Review of Systems  Constitutional:  Negative for chills, fatigue and fever.  HENT:  Positive for congestion, postnasal drip and sinus pressure. Negative for ear pain, rhinorrhea, sneezing and sore throat.   Eyes: Negative.  Negative for pain and redness.  Respiratory:  Positive for cough. Negative for shortness of breath and wheezing.   Cardiovascular:  Negative for chest pain and leg swelling.  Gastrointestinal:  Negative for abdominal pain, blood in stool, constipation, diarrhea and nausea.  Endocrine: Negative for polydipsia and polyphagia.  Genitourinary: Negative.  Negative for dysuria, flank pain, hematuria, pelvic pain, vaginal bleeding and vaginal discharge.  Musculoskeletal:  Negative for arthralgias, back pain, gait problem and joint swelling.  Skin:  Negative for rash.  Neurological: Negative.  Negative for dizziness, tremors, seizures, weakness, light-headedness, numbness and headaches.  Hematological:  Negative for adenopathy.  Psychiatric/Behavioral:  Positive for sleep disturbance (trouble falling asleep). Negative for behavioral problems, confusion and dysphoric mood. The patient is  not nervous/anxious and is not hyperactive.        Objective    Vitals: BP 122/62 (BP Location: Left Arm, Patient Position: Sitting, Cuff Size: Normal)    Pulse 95    Temp 99 F (37.2 C) (Oral)    Resp 16    Ht 5' (1.524 m)    Wt 160 lb (72.6 kg)    SpO2 100% Comment: room air    BMI 31.25 kg/m    Physical Exam  General Appearance:    Overweight female. Alert, cooperative, in no acute distress, appears stated age   Head:    Normocephalic, without obvious abnormality, atraumatic  Eyes:    PERRL, conjunctiva/corneas clear, EOM's intact, fundi    benign, both eyes  Ears:    Normal TM's and external ear canals, both ears  Nose:   Nares normal, septum midline, mucosa congested, scant clear drainage, nosinus tenderness  Throat:   Lips, mucosa, and tongue normal; teeth and gums normal  Neck:   Supple, symmetrical, trachea midline, no adenopathy;    thyroid:  no enlargement/tenderness/nodules; no carotid   bruit or JVD  Back:     Symmetric, no curvature, ROM normal, no CVA tenderness  Lungs:     Clear to auscultation bilaterally, respirations unlabored  Chest Wall:    No tenderness or deformity   Heart:    Normal heart rate. Normal rhythm. No murmurs, rubs, or gallops.   Breast Exam:    deferred  Abdomen:     Soft, non-tender, bowel sounds active all four quadrants,    no masses, no organomegaly  Pelvic:    deferred  Extremities:   All extremities are intact. No cyanosis or edema  Pulses:   2+ and symmetric all extremities  Skin:   Skin color, texture, turgor normal, no rashes or lesions  Lymph nodes:   Cervical, supraclavicular, and axillary nodes normal  Neurologic:   CNII-XII intact, normal strength, sensation and reflexes    throughout      Assessment & Plan     1. Annual physical exam   2. Positive colorectal cancer screening using Cologuard test Scheduled for colonoscopy in January per patient.  3. Type 2 diabetes mellitus with diabetic nephropathy, with long-term current use of insulin (HCC) Well controlled.  Continue routine follow up Dr. Nilda Simmer   4. Vitamin D deficiency Followed by renal  5. Stage 3b chronic kidney disease ([place]) Followed by Dr. Juleen China.   6. Hyperlipidemia associated with type 2 diabetes mellitus ([place]) She is tolerating  atorvastatin well with no adverse effects.    7. Essential hypertension Well controlled.  Continue current medications.    8.  Prescription for Shingrix. Vaccine not administered in office.   - Zoster Vaccine Adjuvanted [facility]) injection; Inject 0.5 mLs into the muscle once for 1 dose. Repeat after 2 months  Dispense: 0.5 mL; Refill: 0     She states she had flu vaccine at pharmacy this year flu season.    Lelon Huh, MD  [facility] [phone] (phone) [phone] (fax)  [place]

## 2021-04-03 NOTE — Progress Notes (Signed)
Annual Wellness Visit     Patient: Margaret Payne, Female    DOB: 10/12/1940, 80 y.o.   MRN: [id number] Visit Date: 04/03/2021  Today's Provider: Lelon Huh, MD   Chief Complaint  Patient presents with   Medicare Wellness   Cough   Subjective    Margaret Payne is a 80 y.o. female who presents today for her Annual Wellness Visit.  Medications: Outpatient Medications Prior to Visit  Medication Sig   acetaminophen (TYLENOL) 500 MG tablet Take 500 mg by mouth every 6 (six) hours as needed.   aspirin EC 81 MG EC tablet Take 1 tablet (81 mg total) by mouth daily. Swallow whole.   atorvastatin (LIPITOR) 80 MG tablet Take 1 tablet by mouth once daily   carvedilol (COREG) 3.125 MG tablet TAKE 1 TABLET BY MOUTH TWICE DAILY WITH MEALS   Cholecalciferol (VITAMIN D3) 5000 units TABS Take 5,000 Units by mouth daily.   Dulaglutide 3 MG/0.5ML SOPN Inject into the skin once a week.   Ferrous Sulfate (IRON) 325 (65 Fe) MG TABS Take by mouth.   insulin lispro protamine-lispro (HUMALOG 75/25 MIX) (75-25) 100 UNIT/ML SUSP injection Inject into the skin 2 (two) times daily with a meal. 40 units in AM and 20 units in PM   lisinopril (ZESTRIL) 10 MG tablet Take 1 tablet (10 mg total) by mouth daily.   Multiple Vitamin (MULTI-VITAMINS) TABS Take by mouth. One a day vitamin- womens health   No facility-administered medications prior to visit.    Allergies  Allergen Reactions   Codeine Sulfate Nausea Only    Patient Care Team: Birdie Sons, MD as PCP - General (Family Medicine) End, Harrell Gave, MD as PCP - Cardiology (Cardiology) Solum, Betsey Holiday, MD as Physician Assistant (Internal Medicine) Birder Robson, MD as Referring Physician (Ophthalmology) Lovell Sheehan, MD as Consulting Physician (Orthopedic Surgery) Lavonia Dana, MD as Consulting Physician (Internal Medicine)        Objective     Most recent functional status assessment: In your present state of health, do  you have any difficulty performing the following activities: 04/03/2021  Hearing? Y  Vision? N  Difficulty concentrating or making decisions? N  Walking or climbing stairs? Y  Dressing or bathing? N  Doing errands, shopping? N  Some recent data might be hidden   Most recent fall risk assessment: Fall Risk  04/03/2021  Falls in the past year? 0  Number falls in past yr: 0  Injury with Fall? 0  Risk for fall due to : No Fall Risks  Risk for fall due to: Comment -  Follow up Falls evaluation completed    Most recent depression screenings: PHQ 2/9 Scores 04/03/2021 10/31/2020  PHQ - 2 Score 0 0  PHQ- 9 Score 4 -   Most recent cognitive screening: 6CIT Screen 11/07/2016  What Year? 0 points  What month? 0 points  What time? 0 points  Count back from 20 0 points  Months in reverse 0 points  Repeat phrase 0 points  Total Score 0   Most recent Audit-C alcohol use screening Alcohol Use Disorder Test (AUDIT) 04/03/2021  1. How often do you have a drink containing alcohol? 0  2. How many drinks containing alcohol do you have on a typical day when you are drinking? 0  3. How often do you have six or more drinks on one occasion? 0  AUDIT-C Score 0   A score of 3 or more in women,  and 4 or more in men indicates increased risk for alcohol abuse, EXCEPT if all of the points are from question 1     Assessment & Plan     Annual wellness visit done today including the all of the following: Reviewed patient's Family Medical History Reviewed and updated list of patient's medical providers Assessment of cognitive impairment was done Assessed patient's functional ability Established a written schedule for health screening [place] Completed and Reviewed  Exercise Activities and Dietary recommendations  Goals      Exercise 3x per week (30 min per time)     Recommend to exercise for 3 days a week for at least 30 minutes at a time.         Immunization  History  Administered Date(s) Administered   Fluad Quad(high Dose 65+) 01/28/2019   Influenza, High Dose Seasonal PF 02/23/2016, 02/15/2018   Influenza,inj,Quad PF,6+ Mos 01/23/2017, 02/12/2021   Influenza-Unspecified 01/30/2020   Moderna Sars-Covid-2 Vaccination 06/18/2019, 07/16/2019, 02/21/2020   Pneumococcal Conjugate-13 10/11/2015   Pneumococcal Polysaccharide-23 11/10/2017   Zoster, Live 04/17/2008    Health Maintenance  Topic Date Due   Zoster Vaccines- Shingrix (1 of 2) Never done   COVID-19 Vaccine (4 - Booster for Moderna series) 04/17/2020   FOOT EXAM  09/19/2020   TETANUS/TDAP  10/20/2026 (Originally 10/19/1960)   HEMOGLOBIN A1C  08/06/2021   OPHTHALMOLOGY EXAM  09/06/2021   DEXA SCAN  02/23/2022   Pneumonia Vaccine 70+ Years old  Completed   INFLUENZA VACCINE  Completed   HPV VACCINES  Aged Out     Discussed health benefits of physical activity, and encouraged her to engage in regular exercise appropriate for her age and condition.         The entirety of the information documented in the History of Present Illness, Review of Systems and Physical Exam were personally obtained by me. Portions of this information were initially documented by the CMA and reviewed by me for thoroughness and accuracy.     Lelon Huh, MD  [facility] [phone] (phone) [phone] (fax)  [place]

## 2021-04-09 ENCOUNTER — Telehealth: Payer: Self-pay | Admitting: Gastroenterology

## 2021-04-09 NOTE — Telephone Encounter (Signed)
Inbound call from pt's daughter [name] stating that her prep is too expensive and needed an alternative. Thank you.

## 2021-04-10 MED ORDER — PEG 3350-KCL-NA BICARB-NACL 420 G PO SOLR: 4000.0000 mL | Freq: Once | ORAL | 0 refills | Status: AC

## 2021-04-10 NOTE — Telephone Encounter (Signed)
Left message on voicemail  Rx for Golytely sent to pharmacy and instructions are to drink 8oz every 15-20 mins 5pm the night before until 1/2 of the jug is gone, Day of the procedure 5 hours prior drink 8ox every 15 minutes until finished

## 2021-05-02 ENCOUNTER — Encounter
Admission: RE | Disposition: A | Discharge status: Home / Self Care | Payer: Self-pay | Source: Home / Self Care | Attending: Gastroenterology

## 2021-05-02 ENCOUNTER — Ambulatory Visit: Payer: HMO | Admitting: Anesthesiology

## 2021-05-02 ENCOUNTER — Other Ambulatory Visit: Payer: Self-pay

## 2021-05-02 ENCOUNTER — Ambulatory Visit
Admission: RE | Admit: 2021-05-02 | Discharge: 2021-05-02 | Disposition: A | Discharge status: Home / Self Care | Payer: HMO | Attending: Gastroenterology | Admitting: Gastroenterology

## 2021-05-02 ENCOUNTER — Encounter: Payer: Self-pay | Admitting: Gastroenterology

## 2021-05-02 DIAGNOSIS — K449 Diaphragmatic hernia without obstruction or gangrene: Secondary | ICD-10-CM | POA: Insufficient documentation

## 2021-05-02 DIAGNOSIS — K621 Rectal polyp: Secondary | ICD-10-CM | POA: Insufficient documentation

## 2021-05-02 DIAGNOSIS — K5730 Diverticulosis of large intestine without perforation or abscess without bleeding: Secondary | ICD-10-CM | POA: Insufficient documentation

## 2021-05-02 DIAGNOSIS — K2210 Ulcer of esophagus without bleeding: Secondary | ICD-10-CM | POA: Insufficient documentation

## 2021-05-02 DIAGNOSIS — D125 Benign neoplasm of sigmoid colon: Secondary | ICD-10-CM | POA: Diagnosis not present

## 2021-05-02 DIAGNOSIS — I509 Heart failure, unspecified: Secondary | ICD-10-CM | POA: Insufficient documentation

## 2021-05-02 DIAGNOSIS — D122 Benign neoplasm of ascending colon: Secondary | ICD-10-CM | POA: Insufficient documentation

## 2021-05-02 DIAGNOSIS — K5520 Angiodysplasia of colon without hemorrhage: Secondary | ICD-10-CM | POA: Insufficient documentation

## 2021-05-02 DIAGNOSIS — Z1211 Encounter for screening for malignant neoplasm of colon: Secondary | ICD-10-CM | POA: Diagnosis not present

## 2021-05-02 DIAGNOSIS — N189 Chronic kidney disease, unspecified: Secondary | ICD-10-CM | POA: Diagnosis not present

## 2021-05-02 DIAGNOSIS — I130 Hypertensive heart and chronic kidney disease with heart failure and stage 1 through stage 4 chronic kidney disease, or unspecified chronic kidney disease: Secondary | ICD-10-CM | POA: Diagnosis not present

## 2021-05-02 DIAGNOSIS — E1122 Type 2 diabetes mellitus with diabetic chronic kidney disease: Secondary | ICD-10-CM | POA: Insufficient documentation

## 2021-05-02 DIAGNOSIS — D509 Iron deficiency anemia, unspecified: Secondary | ICD-10-CM | POA: Diagnosis not present

## 2021-05-02 DIAGNOSIS — R195 Other fecal abnormalities: Secondary | ICD-10-CM | POA: Insufficient documentation

## 2021-05-02 DIAGNOSIS — D508 Other iron deficiency anemias: Secondary | ICD-10-CM | POA: Diagnosis not present

## 2021-05-02 DIAGNOSIS — D126 Benign neoplasm of colon, unspecified: Secondary | ICD-10-CM

## 2021-05-02 HISTORY — PX: COLONOSCOPY WITH PROPOFOL: SHX5780

## 2021-05-02 HISTORY — PX: ESOPHAGOGASTRODUODENOSCOPY: SHX5428

## 2021-05-02 LAB — GLUCOSE, CAPILLARY: Glucose-Capillary: 145 mg/dL — ABNORMAL HIGH (ref 70–99)

## 2021-05-02 SURGERY — COLONOSCOPY WITH PROPOFOL
Anesthesia: General

## 2021-05-02 MED ORDER — PHENYLEPHRINE HCL (PRESSORS) 10 MG/ML IV SOLN
INTRAVENOUS | Status: DC | PRN
  Administered 2021-05-02 (×2): 50 ug via INTRAVENOUS

## 2021-05-02 MED ORDER — SODIUM CHLORIDE 0.9 % IV SOLN: INTRAVENOUS | Status: DC

## 2021-05-02 MED ORDER — PHENYLEPHRINE HCL (PRESSORS) 10 MG/ML IV SOLN
INTRAVENOUS | Status: AC
  Filled 2021-05-02: qty 1

## 2021-05-02 MED ORDER — OMEPRAZOLE MAGNESIUM 20 MG PO TBEC: 40.0000 mg | DELAYED_RELEASE_TABLET | Freq: Two times a day (BID) | ORAL | 0 refills | Status: DC

## 2021-05-02 MED ORDER — PROPOFOL 500 MG/50ML IV EMUL
INTRAVENOUS | Status: AC
  Filled 2021-05-02: qty 50

## 2021-05-02 MED ORDER — PROPOFOL 500 MG/50ML IV EMUL
INTRAVENOUS | Status: DC | PRN
  Administered 2021-05-02: 150 ug/kg/min via INTRAVENOUS

## 2021-05-02 MED ORDER — LIDOCAINE HCL (PF) 2 % IJ SOLN
INTRAMUSCULAR | Status: AC
  Filled 2021-05-02: qty 5

## 2021-05-02 MED ORDER — LIDOCAINE HCL (CARDIAC) PF 100 MG/5ML IV SOSY
PREFILLED_SYRINGE | INTRAVENOUS | Status: DC | PRN
Start: 2021-05-02 — End: 2021-05-02
  Administered 2021-05-02: 50 mg via INTRAVENOUS

## 2021-05-02 NOTE — Op Note (Signed)
[facility] Gastroenterology Patient Name: Margaret Payne Procedure Date: 05/02/2021 8:57 AM MRN: [id number] Account #: [id number] Date of Birth: 03-08-1941 Admit Type: Outpatient Age: 81 Room: [facility] ENDO ROOM 3 Gender: Female Note Status: Finalized Instrument Name: Upper Endoscope [id number] Procedure:             Upper GI endoscopy Indications:           Iron deficiency anemia Providers:             Jonathon Bellows MD, MD Referring MD:          Kirstie Peri. Caryn Section, MD (Referring MD) Medicines:             Monitored Anesthesia Care Complications:         No immediate complications. Procedure:             Pre-Anesthesia Assessment:                        - Prior to the procedure, a History and Physical was                         performed, and patient medications, allergies and                         sensitivities were reviewed. The patient's tolerance                         of previous anesthesia was reviewed.                        - The risks and benefits of the procedure and the                         sedation options and risks were discussed with the                         patient. All questions were answered and informed                         consent was obtained.                        - ASA Grade Assessment: II - A patient with mild                         systemic disease.                        After obtaining informed consent, the endoscope was                         passed under direct vision. Throughout the procedure,                         the patient's blood pressure, pulse, and oxygen                         saturations were monitored continuously. The Endoscope  was introduced through the mouth, and advanced to the                         third part of duodenum. The upper GI endoscopy was                         accomplished with ease. The patient tolerated the                         procedure well. Findings:      One  superficial esophageal ulcer with no stigmata of recent bleeding was       found at the gastroesophageal junction. The lesion was 6 mm in largest       dimension.      A medium-sized hiatal hernia was present.      A small amount of food (residue) was found on the greater curvature of       the stomach.      The cardia and gastric fundus were normal on retroflexion.      The examined duodenum was normal. Biopsies for histology were taken with       a cold forceps for evaluation of celiac disease. Impression:            - Esophageal ulcer with no stigmata of recent bleeding.                        - Medium-sized hiatal hernia.                        - A small amount of food (residue) in the stomach.                        - Normal examined duodenum. Biopsied. Recommendation:        - Await pathology results.                        - Omeprazole 40 mg bid for 8 weks then repeat EGD to                         check for healing of GE junction ulcer Procedure Code(s):     --- Professional ---                        [phone], Esophagogastroduodenoscopy, flexible,                         transoral; with biopsy, single or multiple Diagnosis Code(s):     --- Professional ---                        K22.10, Ulcer of esophagus without bleeding                        K44.9, Diaphragmatic hernia without obstruction or                         gangrene                        D50.9, Iron deficiency anemia, unspecified CPT copyright 2019 American Medical  Association. All rights reserved. The codes documented in this report are preliminary and upon coder review may  be revised to meet current compliance requirements. Jonathon Bellows, MD Jonathon Bellows MD, MD 05/02/2021 9:07:57 AM This report has been signed electronically. Number of Addenda: 0 Note Initiated On: 05/02/2021 8:57 AM Estimated Blood Loss:  Estimated blood loss: none.      [facility]

## 2021-05-02 NOTE — Anesthesia Procedure Notes (Signed)
Date/Time: 05/02/2021 9:04 AM Performed by: Vaughan Sine Pre-anesthesia Checklist: Patient identified, Emergency Drugs available, Suction available, Patient being monitored and Timeout performed Patient Re-evaluated:Patient Re-evaluated prior to induction Oxygen Delivery Method: Nasal cannula Preoxygenation: Pre-oxygenation with 100% oxygen Induction Type: IV induction Airway Equipment and Method: Bite block Placement Confirmation: positive ETCO2 and CO2 detector

## 2021-05-02 NOTE — Transfer of Care (Signed)
Immediate Anesthesia Transfer of Care Note  Patient: Margaret Payne  Procedure(s) Performed: COLONOSCOPY WITH PROPOFOL ESOPHAGOGASTRODUODENOSCOPY (EGD)  Patient Location: PACU  Anesthesia Type:General  Level of Consciousness: awake and sedated  Airway & Oxygen Therapy: Patient Spontanous Breathing and Patient connected to nasal cannula oxygen  Post-op Assessment: Report given to RN and Post -op Vital signs reviewed and stable  Post vital signs: Reviewed and stable  Last Vitals:  Vitals Value Taken Time  BP    Temp    Pulse    Resp    SpO2      Last Pain:  Vitals:   05/02/21 0823  TempSrc: Temporal  PainSc: 0-No pain         Complications: No notable events documented.

## 2021-05-02 NOTE — Op Note (Signed)
[facility] Gastroenterology Patient Name: Margaret Payne Procedure Date: 05/02/2021 8:56 AM MRN: [id number] Account #: [id number] Date of Birth: 1940/11/03 Admit Type: Outpatient Age: 81 Room: [facility] ENDO ROOM 3 Gender: Female Note Status: Finalized Instrument Name: [place] Riling [id number] Procedure:             Colonoscopy Indications:           Iron deficiency anemia Providers:             Jonathon Bellows MD, MD Referring MD:          Kirstie Peri. Caryn Section, MD (Referring MD) Medicines:             Monitored Anesthesia Care Complications:         No immediate complications. Procedure:             Pre-Anesthesia Assessment:                        - Prior to the procedure, a History and Physical was                         performed, and patient medications, allergies and                         sensitivities were reviewed. The patient's tolerance                         of previous anesthesia was reviewed.                        - The risks and benefits of the procedure and the                         sedation options and risks were discussed with the                         patient. All questions were answered and informed                         consent was obtained.                        - ASA Grade Assessment: II - A patient with mild                         systemic disease.                        After obtaining informed consent, the colonoscope was                         passed under direct vision. Throughout the procedure,                         the patient's blood pressure, pulse, and oxygen                         saturations were monitored continuously. The                         Colonoscope was introduced through  the anus and                         advanced to the the cecum, identified by the                         appendiceal orifice. The colonoscopy was performed                         with ease. The patient tolerated the procedure well.                          The quality of the bowel preparation was good. Findings:      The perianal and digital rectal examinations were normal.      Seven sessile polyps were found in the ascending colon. The polyps were       4 to 6 mm in size. These polyps were removed with a cold snare.       Resection and retrieval were complete.      Two sessile polyps were found in the rectum and sigmoid colon. The       polyps were 5 to 6 mm in size. These polyps were removed with a cold       snare. Resection and retrieval were complete.      Multiple small-mouthed diverticula were found in the entire colon.      A single large angioectasia without bleeding was found in the ascending       colon. Coagulation for bleeding prevention using argon beam at 0.5       liters/minute and 20 watts was successful.      The exam was otherwise without abnormality on direct and retroflexion       views. Impression:            - Seven 4 to 6 mm polyps in the ascending colon,                         removed with a cold snare. Resected and retrieved.                        - Two 5 to 6 mm polyps in the rectum and in the                         sigmoid colon, removed with a cold snare. Resected and                         retrieved.                        - Diverticulosis in the entire examined colon.                        - A single non-bleeding colonic angioectasia. Treated                         with argon beam coagulation.                        - The examination was otherwise normal on direct and  retroflexion views. Recommendation:        - Discharge patient to home (with escort).                        - Resume previous diet.                        - Continue present medications.                        - Await pathology results.                        - Repeat colonoscopy is not recommended due to current                         age (56 years or older) for surveillance.                        - Return  to GI office in 4 weeks. Procedure Code(s):     --- Professional ---                        [phone], 59, Colonoscopy, flexible; with control of                         bleeding, any method                        45385, Colonoscopy, flexible; with removal of                         tumor(s), polyp(s), or other lesion(s) by snare                         technique Diagnosis Code(s):     --- Professional ---                        K62.1, Rectal polyp                        K63.5, Polyp of colon                        K55.20, Angiodysplasia of colon without hemorrhage                        D50.9, Iron deficiency anemia, unspecified                        K57.30, Diverticulosis of large intestine without                         perforation or abscess without bleeding CPT copyright 2019 American Medical Association. All rights reserved. The codes documented in this report are preliminary and upon coder review may  be revised to meet current compliance requirements. Jonathon Bellows, MD Jonathon Bellows MD, MD 05/02/2021 9:37:12 AM This report has been signed electronically. Number of Addenda: 0 Note Initiated On: 05/02/2021 8:56 AM Scope Withdrawal Time: 0 hours 14 minutes 25 seconds  Total Procedure Duration: 0 hours 25 minutes 1 second  Estimated Blood Loss:  Estimated blood loss: none.      [facility]

## 2021-05-02 NOTE — H&P (Signed)
Jonathon Bellows, MD [address], [place], [place], [place], [address] [address], [place], [place], [place], [address] Phone: [phone]  Fax: [phone]  Primary Care Physician:  Birdie Sons, MD   Pre-Procedure History & Physical: HPI:  Margaret Payne is a 81 y.o. female is here for an endoscopy and colonoscopy    Past Medical History:  Diagnosis Date   Arthritis    CHF (congestive heart failure) ([place])    Chronic kidney disease    Diabetes mellitus without complication ([place])    type 2   Headache    migrains   HOH (hard of hearing)    Hyperlipidemia    Hypertension     Past Surgical History:  Procedure Laterality Date   ABDOMINAL HYSTERECTOMY     BREAST BIOPSY     CARDIAC CATHETERIZATION     CATARACT EXTRACTION W/PHACO Right 10/03/2014   Procedure: CATARACT EXTRACTION PHACO AND INTRAOCULAR LENS PLACEMENT ([place]);  Surgeon: Birder Robson, MD;  Location: [facility] ORS;  Service: Ophthalmology;  Laterality: Right;  [place] 00:57 AP% 16.7 CDE 9.52 Fluid pack QQI#[id number] H   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   Procedure: CORONARY STENT INTERVENTION;  Surgeon: Nelva Bush, MD;  Location: [place] CV LAB;  Service: Cardiovascular;  Laterality: N/A;   INJECTION KNEE     LEFT HEART CATH AND CORONARY ANGIOGRAPHY N/A 03/13/2020   Procedure: LEFT HEART CATH AND CORONARY ANGIOGRAPHY;  Surgeon: Nelva Bush, MD;  Location: [place] CV LAB;  Service: Cardiovascular;  Laterality: N/A;    Prior to Admission medications   Medication Sig Start Date End Date Taking? Authorizing Provider  acetaminophen (TYLENOL) 500 MG tablet Take 500 mg by mouth every 6 (six) hours as needed.   Yes [provider]  aspirin EC 81 MG EC tablet Take 1 tablet (81 mg total) by mouth daily. Swallow whole. 03/15/20  Yes Donne Hazel, MD  atorvastatin (LIPITOR) 80 MG tablet Take 1 tablet by mouth once daily 03/06/21  Yes Gollan, Kathlene November, MD  carvedilol (COREG)  3.125 MG tablet TAKE 1 TABLET BY MOUTH TWICE DAILY WITH MEALS 03/06/21  Yes Loel Dubonnet, NP  Cholecalciferol (VITAMIN D3) 5000 units TABS Take 5,000 Units by mouth daily.   Yes [provider]  Ferrous Sulfate (IRON) 325 (65 Fe) MG TABS Take by mouth.   Yes [provider]  insulin lispro protamine-lispro (HUMALOG 75/25 MIX) (75-25) 100 UNIT/ML SUSP injection Inject into the skin 2 (two) times daily with a meal. 40 units in AM and 20 units in PM   Yes [provider]  lisinopril (ZESTRIL) 10 MG tablet Take 1 tablet (10 mg total) by mouth daily. 12/19/20 12/14/21 Yes End, Harrell Gave, MD  Multiple Vitamin (MULTI-VITAMINS) TABS Take by mouth. One a day vitamin- womens health   Yes [provider]  Dulaglutide 3 MG/0.5ML SOPN Inject into the skin once a week.    [provider]    Allergies as of 03/27/2021 - Review Complete 03/26/2021  Allergen Reaction Noted   Codeine sulfate Nausea Only 09/27/2014    Family History  Problem Relation Age of Onset   Breast cancer Mother    Emphysema Father    Breast cancer Sister 3    Social History   Socioeconomic History   Marital status: Divorced    Spouse name: Not on file   Number of children: 1   Years of education: Not on file   Highest education level: Associate degree:  occupational, Hotel manager, or vocational program  Occupational History   Occupation: Retired  Tobacco Use   Smoking status: Never   Smokeless tobacco: Never  Vaping Use   Vaping Use: Never used  Substance and Sexual Activity   Alcohol use: No    Alcohol/week: 0.0 standard drinks   Drug use: No   Sexual activity: Not on file  Other Topics Concern   Not on file  Social History Narrative   Not on file   Social Determinants of Health   Financial Resource Strain: Not on file  Food Insecurity: Not on file  Transportation Needs: Not on file  Physical Activity: Not on file  Stress: Not on file  Social Connections: Not  on file  Intimate Partner Violence: Not on file    Review of Systems: See HPI, otherwise negative ROS  Physical Exam: BP (!) 147/65    Pulse 88    Temp 98.1 F (36.7 C) (Temporal)    Resp 20    Ht 5' (1.524 m)    Wt 71.2 kg    SpO2 100%    BMI 30.66 kg/m  General:   Alert,  pleasant and cooperative in NAD Head:  Normocephalic and atraumatic. Neck:  Supple; no masses or thyromegaly. Lungs:  Clear throughout to auscultation, normal respiratory effort.    Heart:  +S1, +S2, Regular rate and rhythm, No edema. Abdomen:  Soft, nontender and nondistended. Normal bowel sounds, without guarding, and without rebound.   Neurologic:  Alert and  oriented x4;  grossly normal neurologically.  Impression/Plan: Margaret Payne is here for an endoscopy and colonoscopy  to be performed for  evaluation of iron deficiency anemia and positive cologuard    Risks, benefits, limitations, and alternatives regarding endoscopy have been reviewed with the patient.  Questions have been answered.  All parties agreeable.   Jonathon Bellows, MD  05/02/2021, 8:45 AM

## 2021-05-02 NOTE — Anesthesia Preprocedure Evaluation (Signed)
Anesthesia Evaluation  Patient identified by MRN, date of birth, ID band Patient awake    Reviewed: Allergy & Precautions, NPO status , Patient's Chart, lab work & pertinent test results  History of Anesthesia Complications Negative for: history of anesthetic complications  Airway Mallampati: III   Neck ROM: Full    Dental  (+) Upper Dentures, Dental Advidsory Given, Missing   Pulmonary neg pulmonary ROS,           Cardiovascular hypertension, (-) angina+ CAD, + Past MI, + Cardiac Stents and +CHF  (-) dysrhythmias (-) Valvular Problems/Murmurs     Neuro/Psych negative neurological ROS  negative psych ROS   GI/Hepatic Neg liver ROS, GERD  ,  Endo/Other  diabetes, Oral Hypoglycemic Agents, Insulin Dependent  Renal/GU CRFRenal disease     Musculoskeletal   Abdominal   Peds  Hematology   Anesthesia Other Findings Past Medical History: No date: Arthritis No date: CHF (congestive heart failure) (HCC) No date: Chronic kidney disease No date: Diabetes mellitus without complication ([facility])     Comment:  type 2 No date: Headache     Comment:  migrains No date: HOH (hard of hearing) No date: Hyperlipidemia No date: Hypertension   Reproductive/Obstetrics negative OB ROS                             Anesthesia Physical  Anesthesia Plan  ASA: 3  Anesthesia Plan: General   Post-op Pain Management:    Induction: Intravenous  PONV Risk Score and Plan: 3 and Propofol infusion and TIVA  Airway Management Planned: Nasal Cannula and Natural Airway  Additional Equipment:   Intra-op Plan:   Post-operative Plan:   Informed Consent: I have reviewed the patients History and Physical, chart, labs and discussed the procedure including the risks, benefits and alternatives for the proposed anesthesia with the patient or authorized representative who has indicated his/her understanding and  acceptance.       Plan Discussed with:   Anesthesia Plan Comments:         Anesthesia Quick Evaluation

## 2021-05-03 ENCOUNTER — Encounter: Payer: Self-pay | Admitting: Family Medicine

## 2021-05-03 ENCOUNTER — Encounter: Payer: Self-pay | Admitting: Gastroenterology

## 2021-05-03 DIAGNOSIS — Z86010 Personal history of colonic polyps: Secondary | ICD-10-CM | POA: Insufficient documentation

## 2021-05-03 LAB — SURGICAL PATHOLOGY

## 2021-05-04 NOTE — Anesthesia Postprocedure Evaluation (Signed)
Anesthesia Post Note  Patient: Margaret Payne  Procedure(s) Performed: COLONOSCOPY WITH PROPOFOL ESOPHAGOGASTRODUODENOSCOPY (EGD)  Patient location during evaluation: Endoscopy Anesthesia Type: General Level of consciousness: awake and alert Pain management: pain level controlled Vital Signs Assessment: post-procedure vital signs reviewed and stable Respiratory status: spontaneous breathing, nonlabored ventilation, respiratory function stable and patient connected to nasal cannula oxygen Cardiovascular status: blood pressure returned to baseline and stable Postop Assessment: no apparent nausea or vomiting Anesthetic complications: no   No notable events documented.   Last Vitals:  Vitals:   05/02/21 0823 05/02/21 0936  BP: (!) 147/65 (!) 89/49  Pulse: 88 79  Resp: 20 16  Temp: 36.7 C 36.4 C  SpO2: 100% 96%    Last Pain:  Vitals:   05/03/21 0735  TempSrc:   PainSc: 0-No pain                 Martha Clan

## 2021-06-10 DIAGNOSIS — E1159 Type 2 diabetes mellitus with other circulatory complications: Secondary | ICD-10-CM | POA: Diagnosis not present

## 2021-06-19 ENCOUNTER — Ambulatory Visit (INDEPENDENT_AMBULATORY_CARE_PROVIDER_SITE_OTHER): Payer: HMO | Admitting: Internal Medicine

## 2021-06-19 ENCOUNTER — Encounter: Payer: Self-pay | Admitting: Internal Medicine

## 2021-06-19 ENCOUNTER — Other Ambulatory Visit: Payer: Self-pay

## 2021-06-19 VITALS — BP 130/70 | HR 87 | Ht 60.0 in | Wt 156.0 lb

## 2021-06-19 DIAGNOSIS — E1169 Type 2 diabetes mellitus with other specified complication: Secondary | ICD-10-CM | POA: Diagnosis not present

## 2021-06-19 DIAGNOSIS — N184 Chronic kidney disease, stage 4 (severe): Secondary | ICD-10-CM | POA: Diagnosis not present

## 2021-06-19 DIAGNOSIS — I5020 Unspecified systolic (congestive) heart failure: Secondary | ICD-10-CM

## 2021-06-19 DIAGNOSIS — I2510 Atherosclerotic heart disease of native coronary artery without angina pectoris: Secondary | ICD-10-CM | POA: Diagnosis not present

## 2021-06-19 DIAGNOSIS — E785 Hyperlipidemia, unspecified: Secondary | ICD-10-CM

## 2021-06-19 NOTE — Progress Notes (Signed)
? ?Follow-up Outpatient Visit ?Date: 06/19/2021 ? ?Primary Care Provider: ?Birdie Sons, MD ?[address] ?[place] [address] ? ?Chief Complaint: Follow-up coronary artery disease and heart failure ? ?HPI:  Margaret Payne is a 81 y.o. 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  I last saw her in 11/2020, at which time she noted occasional swelling in her feet that she attributed to excess sodium intake.  She also reported an episode of orthostatic lightheadedness with possible brief loss of consciousness.  Due to concern for symptomatic hypotension and orthostatic lightheadedness, we decreased lisinopril from 20 mg to 10 mg daily.  She underwent upper and lower endoscopies in January for work-up of iron deficiency.  Angiectasia was noted in the ascending colon, treated with laser coagulation. ? ?Today, Margaret Payne reports that she is feeling well.  She was placed on iron and feels like her energy is improving.  She denies chest pain, shortness of breath, palpitations, and lightheadedness.  She has occasional dependent leg edema, though this typically resolves when she elevates her legs.  She is tolerating her medications well. ? ?-------------------------------------------------------------------------------------------------- ? ?Past Medical History:  ?Diagnosis Date  ? Arthritis   ? CHF (congestive heart failure) ([place])   ? Chronic kidney disease   ? Diabetes mellitus without complication ([place])   ? type 2  ? Headache   ? migrains  ? HOH (hard of hearing)   ? Hyperlipidemia   ? Hypertension   ? Non-ST elevation (NSTEMI) myocardial infarction [facility])   ? ?Past Surgical History:  ?Procedure  Laterality Date  ? ABDOMINAL HYSTERECTOMY    ? BREAST BIOPSY    ? CARDIAC CATHETERIZATION    ? CATARACT EXTRACTION W/PHACO Right 10/03/2014  ? Procedure: CATARACT EXTRACTION PHACO AND INTRAOCULAR LENS PLACEMENT (IOC);  Surgeon: Birder Robson, MD;  Location: [facility] ORS;  Service: Ophthalmology;  Laterality: Right;  [place] 00:57 ?AP% 16.7 ?CDE 9.52 ?Fluid pack [id number] H  ? COLONOSCOPY WITH PROPOFOL N/A 05/02/2021  ? Procedure: COLONOSCOPY WITH PROPOFOL;  Surgeon: Jonathon Bellows, MD;  Location: [facility] ENDOSCOPY;  Service: Gastroenterology;  Laterality: N/A;  ? 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  ? Procedure: CORONARY STENT INTERVENTION;  Surgeon: Nelva Bush, MD;  Location: [place] CV LAB;  Service: Cardiovascular;  Laterality: N/A;  ? ESOPHAGOGASTRODUODENOSCOPY N/A 05/02/2021  ? Procedure: ESOPHAGOGASTRODUODENOSCOPY (EGD);  Surgeon: Jonathon Bellows, MD;  Location: [facility] ENDOSCOPY;  Service: Gastroenterology;  Laterality: N/A;  ? INJECTION KNEE    ? LEFT HEART CATH AND CORONARY ANGIOGRAPHY N/A 03/13/2020  ? Procedure: LEFT HEART CATH AND CORONARY ANGIOGRAPHY;  Surgeon: Nelva Bush, MD;  Location: [place] CV LAB;  Service: Cardiovascular;  Laterality: N/A;  ? ? ?Current Meds  ?Medication Sig  ? acetaminophen (TYLENOL) 500 MG tablet Take 500 mg by mouth every 6 (six) hours as needed.  ? aspirin EC 81 MG EC tablet Take 1 tablet (81 mg total) by mouth daily. Swallow whole.  ? atorvastatin (LIPITOR) 80 MG tablet Take 1 tablet by mouth once daily  ? carvedilol (COREG) 3.125 MG tablet TAKE 1 TABLET BY MOUTH TWICE DAILY WITH MEALS  ? Cholecalciferol (VITAMIN D3) 5000 units TABS Take 5,000 Units by mouth daily.  ? Docusate Calcium (  STOOL SOFTENER PO) Take by mouth daily as needed.  ? Dulaglutide 3 MG/0.5ML SOPN Inject into the skin once a week.  ? Ferrous Sulfate (IRON) 325 (65 Fe) MG TABS Take by mouth daily.  ? insulin lispro protamine-lispro (HUMALOG 75/25 MIX) (75-25) 100 UNIT/ML SUSP injection Inject into  the skin 2 (two) times daily with a meal. 40 units in AM and 20 units in PM  ? lisinopril (ZESTRIL) 10 MG tablet Take 1 tablet (10 mg total) by mouth daily.  ? Multiple Vitamin (MULTI-VITAMINS) TABS Take by mouth daily. One a day vitamin- womens health  ? omeprazole (PRILOSEC OTC) 20 MG tablet Take 40 mg by mouth daily.  ? ? ?Allergies: Codeine sulfate ? ?Social History  ? ?Tobacco Use  ? Smoking status: Never  ? Smokeless tobacco: Never  ?Vaping Use  ? Vaping Use: Never used  ?Substance Use Topics  ? Alcohol use: No  ?  Alcohol/week: 0.0 standard drinks  ? Drug use: No  ? ? ?Family History  ?Problem Relation Age of Onset  ? Breast cancer Mother   ? Emphysema Father   ? Breast cancer Sister 59  ? ? ?Review of Systems: ?A 12-system review of systems was performed and was negative except as noted in the HPI. ? ?-------------------------------------------------------------------------------------------------- ? ?Physical Exam: ?BP 130/70 (BP Location: Left Arm, Patient Position: Sitting, Cuff Size: Normal)   Pulse 87   Ht 5' (1.524 m)   Wt 156 lb (70.8 kg)   SpO2 98%   BMI 30.47 kg/m?  ? ?General:  NAD. ?Neck: No JVD or HJR. ?Lungs: Clear to auscultation bilaterally without wheezes or crackles. ?Heart: Regular rate and rhythm without murmurs, rubs, or gallops. ?Abdomen: Soft, nontender, nondistended. ?Extremities: Trace ankle edema. ? ?EKG: Normal sinus rhythm with poor R wave progression.  Heart rate has increased; otherwise, no significant change since 12/19/2020. ? ?Lab Results  ?Component Value Date  ? WBC 9.6 03/14/2020  ? HGB 11.4 (L) 03/14/2020  ? HCT 34.1 (L) 03/14/2020  ? MCV 89.7 03/14/2020  ? PLT 246 03/14/2020  ? ? ?Lab Results  ?Component Value Date  ? NA 140 03/14/2020  ? K 3.9 03/14/2020  ? CL 107 03/14/2020  ? CO2 23 03/14/2020  ? BUN 21 03/14/2020  ? CREATININE 1.55 (H) 03/14/2020  ? GLUCOSE 205 (H) 03/14/2020  ? ALT 22 03/13/2020  ? ? ?Lab Results  ?Component Value Date  ? CHOL 170 03/13/2020  ?  HDL 39 (L) 03/13/2020  ? [place] 91 03/13/2020  ? TRIG 198 (H) 03/13/2020  ? CHOLHDL 4.4 03/13/2020  ? ? ?-------------------------------------------------------------------------------------------------- ? ?ASSESSMENT AND PLAN: ?Coronary artery disease: ?No angina following PCI to the LAD in 02/2020.  Continue current medications for secondary prevention, including aspirin, carvedilol, and atorvastatin. ? ?Chronic HFrEF with recovered ejection fraction: ?Margaret Payne has trace dependent edema but otherwise appears euvolemic with stable NYHA class II symptoms.  Lightheadedness resolved with de-escalation of lisinopril at our last visit.  We will defer medication changes today continuing carvedilol and lisinopril at their current doses. ? ?Hyperlipidemia associated with type 2 diabetes mellitus: ?Lipids last checked through [facility] clinic in 01/2021, at which time LDL was 35 and triglycerides 141, both at goal.  Continue atorvastatin 80 mg daily. ? ?Chronic kidney disease stage IV: ?Most recent creatinine check last month actually showed slight improvement in creatinine down to 1.5 from 1.7 on last check in 02/2021.  Continue current dose of lisinopril with close follow-up through nephrology.  Nephrotoxic agents to  be avoided. ? ?Follow-up: Return to clinic in 1 year. ? ?Nelva Bush, MD ?06/19/2021 ?10:26 AM ? ?

## 2021-06-19 NOTE — Patient Instructions (Signed)
Medication Instructions:  ? ?Your physician recommends that you continue on your current medications as directed. Please refer to the Current Medication list given to you today. ? ?*If you need a refill on your cardiac medications before your next appointment, please call your pharmacy* ? ? ?Lab Work: ? ?None ordered ? ?Testing/Procedures: ? ?None ordered ? ? ?Follow-Up: ?At [facility], you and your health needs are our priority.  As part of our continuing mission to provide you with exceptional heart care, we have created designated Provider Care Teams.  These Care Teams include your primary Cardiologist (physician) and Advanced Practice Providers (APPs -  Physician Assistants and Nurse Practitioners) who all work together to provide you with the care you need, when you need it. ? ?We recommend signing up for the patient portal called "MyChart".  Sign up information is provided on this After Visit Summary.  MyChart is used to connect with patients for Virtual Visits (Telemedicine).  Patients are able to view lab/test results, encounter notes, upcoming appointments, etc.  Non-urgent messages can be sent to your provider as well.   ?To learn more about what you can do with MyChart, go to [email or web address].   ? ?Your next appointment:   ?1 year(s) ? ?The format for your next appointment:   ?In Person ? ?Provider:   ?You may see Nelva Bush, MD or one of the following Advanced Practice Providers on your designated Care Team:   ?Murray Hodgkins, NP ?Christell Faith, PA-C ?Cadence Kathlen Mody, PA-C ?

## 2021-06-21 DIAGNOSIS — E1159 Type 2 diabetes mellitus with other circulatory complications: Secondary | ICD-10-CM | POA: Diagnosis not present

## 2021-06-21 DIAGNOSIS — E11319 Type 2 diabetes mellitus with unspecified diabetic retinopathy without macular edema: Secondary | ICD-10-CM | POA: Diagnosis not present

## 2021-06-21 DIAGNOSIS — E1129 Type 2 diabetes mellitus with other diabetic kidney complication: Secondary | ICD-10-CM | POA: Diagnosis not present

## 2021-06-21 DIAGNOSIS — E1169 Type 2 diabetes mellitus with other specified complication: Secondary | ICD-10-CM | POA: Diagnosis not present

## 2021-06-21 DIAGNOSIS — E1122 Type 2 diabetes mellitus with diabetic chronic kidney disease: Secondary | ICD-10-CM | POA: Diagnosis not present

## 2021-06-21 DIAGNOSIS — R809 Proteinuria, unspecified: Secondary | ICD-10-CM | POA: Diagnosis not present

## 2021-06-21 DIAGNOSIS — E785 Hyperlipidemia, unspecified: Secondary | ICD-10-CM | POA: Diagnosis not present

## 2021-06-21 DIAGNOSIS — N184 Chronic kidney disease, stage 4 (severe): Secondary | ICD-10-CM | POA: Diagnosis not present

## 2021-06-21 DIAGNOSIS — Z794 Long term (current) use of insulin: Secondary | ICD-10-CM | POA: Diagnosis not present

## 2021-06-21 DIAGNOSIS — E782 Mixed hyperlipidemia: Secondary | ICD-10-CM | POA: Diagnosis not present

## 2021-06-24 ENCOUNTER — Encounter: Payer: Self-pay | Admitting: Gastroenterology

## 2021-06-24 ENCOUNTER — Other Ambulatory Visit: Payer: Self-pay

## 2021-06-24 ENCOUNTER — Ambulatory Visit (INDEPENDENT_AMBULATORY_CARE_PROVIDER_SITE_OTHER): Payer: HMO | Admitting: Gastroenterology

## 2021-06-24 VITALS — BP 118/71 | HR 77 | Temp 98.3°F | Wt 156.8 lb

## 2021-06-24 DIAGNOSIS — D649 Anemia, unspecified: Secondary | ICD-10-CM | POA: Diagnosis not present

## 2021-06-24 DIAGNOSIS — K209 Esophagitis, unspecified without bleeding: Secondary | ICD-10-CM | POA: Diagnosis not present

## 2021-06-24 NOTE — Patient Instructions (Signed)
High-Fiber Eating Plan °Fiber, also called dietary fiber, is a type of carbohydrate. It is found foods such as fruits, vegetables, whole grains, and beans. A high-fiber diet can have many health benefits. Your health care provider may recommend a high-fiber diet to help: °Prevent constipation. Fiber can make your bowel movements more regular. °Lower your cholesterol. °Relieve the following conditions: °Inflammation of veins in the anus (hemorrhoids). °Inflammation of specific areas of the digestive tract (uncomplicated diverticulosis). °A problem of the large intestine, also called the colon, that sometimes causes pain and diarrhea (irritable bowel syndrome, or IBS). °Prevent overeating as part of a weight-loss plan. °Prevent heart disease, type 2 diabetes, and certain cancers. °What are tips for following this plan? °Reading food labels ° °Check the nutrition facts label on food products for the amount of dietary fiber. Choose foods that have 5 grams of fiber or more per serving. °The goals for recommended daily fiber intake include: °Men (age 50 or younger): 34-38 g. °Men (over age 50): 28-34 g. °Women (age 50 or younger): 25-28 g. °Women (over age 50): 22-25 g. °Your daily fiber goal is _____________ g. °Shopping °Choose whole fruits and vegetables instead of processed forms, such as apple juice or applesauce. °Choose a wide variety of high-fiber foods such as avocados, lentils, oats, and kidney beans. °Read the nutrition facts label of the foods you choose. Be aware of foods with added fiber. These foods often have high sugar and sodium amounts per serving. °Cooking °Use whole-grain flour for baking and cooking. °Cook with brown rice instead of white rice. °Meal planning °Start the day with a breakfast that is high in fiber, such as a cereal that contains 5 g of fiber or more per serving. °Eat breads and cereals that are made with whole-grain flour instead of refined flour or white flour. °Eat brown rice, bulgur  wheat, or millet instead of white rice. °Use beans in place of meat in soups, salads, and pasta dishes. °Be sure that half of the grains you eat each day are whole grains. °General information °You can get the recommended daily intake of dietary fiber by: °Eating a variety of fruits, vegetables, grains, nuts, and beans. °Taking a fiber supplement if you are not able to take in enough fiber in your diet. It is better to get fiber through food than from a supplement. °Gradually increase how much fiber you consume. If you increase your intake of dietary fiber too quickly, you may have bloating, cramping, or gas. °Drink plenty of water to help you digest fiber. °Choose high-fiber snacks, such as berries, raw vegetables, nuts, and popcorn. °What foods should I eat? °Fruits °Berries. Pears. Apples. Oranges. Avocado. Prunes and raisins. Dried figs. °Vegetables °Sweet potatoes. Spinach. Kale. Artichokes. Cabbage. Broccoli. Cauliflower. Green peas. Carrots. Squash. °Grains °Whole-grain breads. Multigrain cereal. Oats and oatmeal. Brown rice. Barley. Bulgur wheat. Millet. Quinoa. Bran muffins. Popcorn. Rye wafer crackers. °Meats and other proteins °Navy beans, kidney beans, and pinto beans. Soybeans. Split peas. Lentils. Nuts and seeds. °Dairy °Fiber-fortified yogurt. °Beverages °Fiber-fortified soy milk. Fiber-fortified orange juice. °Other foods °Fiber bars. °The items listed above may not be a complete list of recommended foods and beverages. Contact a dietitian for more information. °What foods should I avoid? °Fruits °Fruit juice. Cooked, strained fruit. °Vegetables °Fried potatoes. Canned vegetables. Well-cooked vegetables. °Grains °White bread. Pasta made with refined flour. White rice. °Meats and other proteins °Fatty cuts of meat. Fried chicken or fried fish. °Dairy °Milk. Yogurt. Cream cheese. Sour cream. °Fats and   oils °Butters. °Beverages °Soft drinks. °Other foods °Cakes and pastries. °The items listed above may  not be a complete list of foods and beverages to avoid. Talk with your dietitian about what choices are best for you. °Summary °Fiber is a type of carbohydrate. It is found in foods such as fruits, vegetables, whole grains, and beans. °A high-fiber diet has many benefits. It can help to prevent constipation, lower blood cholesterol, aid weight loss, and reduce your risk of heart disease, diabetes, and certain cancers. °Increase your intake of fiber gradually. Increasing fiber too quickly may cause cramping, bloating, and gas. Drink plenty of water while you increase the amount of fiber you consume. °The best sources of fiber include whole fruits and vegetables, whole grains, nuts, seeds, and beans. °This information is not intended to replace advice given to you by your health care provider. Make sure you discuss any questions you have with your health care provider. °Document Revised: 08/11/2019 Document Reviewed: 08/11/2019 °Elsevier Patient Education © 2022 Elsevier Inc. ° °

## 2021-06-24 NOTE — Progress Notes (Signed)
?  ?Jonathon Bellows MD, MRCP(U.K) ?[address]  ?[address]  ?[place], [place] [address]  ?Main: [phone]  ?Fax: [phone] ? ? ?Primary Care Physician: Birdie Sons, MD ? ?Primary Gastroenterologist:  Dr. Jonathon Bellows  ? ?Chief Complaint  ?Patient presents with  ? IDA  ? ? ?HPI: Margaret Payne is a 81 y.o. female ? ?Summary of history : ? ?Initially referred and seen in December 2022 by Dr. Bonna Gains for a positive Cologuard.  Was noted to have iron deficiency as well as this point of time.  Also noted with slight elevation of alkaline phosphatase ? ?Interval history 03/26/2021-to 06/24/2021 ? ?05/02/2021: EGD: Esophageal ulcer with no stigmata of recent bleeding seen at the GE junction medium size hiatal hernia noted.  Biopsies of the duodenum taken.  Plan to repeat EGD after 8 weeks of PPI.  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 ? ? 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  8 polyps were adenomas. ? ? ?She is doing well since her endoscopy.  No symptoms of reflux after we placed her on omeprazole 40 mg a day.  She is eating better she is tolerating a diet well. ? ? ?Current Outpatient Medications  ?Medication Sig Dispense Refill  ? acetaminophen (TYLENOL) 500 MG tablet Take 500 mg by mouth every 6 (six) hours as needed.    ? aspirin EC 81 MG EC tablet Take 1 tablet (81 mg total) by mouth daily. Swallow whole. 30 tablet 0  ? atorvastatin (LIPITOR) 80 MG tablet Take 1 tablet by mouth once daily 30 tablet 3  ? carvedilol (COREG) 3.125 MG tablet TAKE 1 TABLET BY MOUTH TWICE DAILY WITH MEALS 180 tablet 2  ? Cholecalciferol (VITAMIN D3) 5000 units TABS Take 5,000 Units by mouth daily.    ? Docusate Calcium (STOOL SOFTENER PO) Take by mouth daily as needed.    ? Dulaglutide 3 MG/0.5ML SOPN Inject into the skin once a week.     ? ferrous sulfate 325 (65 FE) MG tablet Take 1 tablet by mouth daily with breakfast.    ? insulin lispro protamine-lispro (HUMALOG 75/25 MIX) (75-25) 100 UNIT/ML SUSP injection Inject into the skin 2 (two) times daily with a meal. 40 units in AM and 20 units in PM    ? lisinopril (ZESTRIL) 10 MG tablet Take 1 tablet (10 mg total) by mouth daily. 90 tablet 3  ? Multiple Vitamin (MULTI-VITAMINS) TABS Take by mouth daily. One a day vitamin- womens health    ? omeprazole (PRILOSEC OTC) 20 MG tablet Take 40 mg by mouth daily.    ? ?No current facility-administered medications for this visit.  ? ? ?Allergies as of 06/24/2021 - Review Complete 06/24/2021  ?Allergen Reaction Noted  ? Codeine sulfate Nausea Only 09/27/2014  ? ? ?ROS: ? ?General: Negative for anorexia, weight loss, fever, chills, fatigue, weakness. ?ENT: Negative for hoarseness, difficulty swallowing , nasal congestion. ?CV: Negative for chest pain, angina, palpitations, dyspnea on exertion, peripheral edema.  ?Respiratory: Negative for dyspnea at rest, dyspnea on exertion, cough, sputum, wheezing.  ?GI: See history of present illness. ?GU:  Negative for dysuria, hematuria, urinary incontinence, urinary frequency, nocturnal urination.  ?Endo: Negative for unusual weight change.  ?  ?Physical Examination: ? ? BP 118/71   Pulse 77   Temp  98.3 ?F (36.8 ?C) (Oral)   Wt 156 lb 12.8 oz (71.1 kg)   BMI 30.62 kg/m?  ? ?General: Well-nourished, well-developed in no acute distress.  ?Eyes: No icterus. Conjunctivae pink. ?Mouth: Oropharyngeal mucosa moist and pink , no lesions erythema or exudate. ?Neuro: Alert and oriented x 3.  Grossly intact. ?Skin: Warm and dry, no jaundice.   ?Psych: Alert and cooperative, normal mood and affect. ? ? ?Imaging Studies: ?No results found. ? ?Assessment and Plan:  ? ?Margaret Payne is a 81 y.o. y/o female here to follow-up for iron deficiency anemia and positive Cologuard.  Recent EGD showed esophagitis and an esophageal ulcer,  colonoscopy showed a large AVM nonbleeding that was ablated in about 8 adenomas were resected.  Significant improvement after starting PPI ? ?Plan ?1.  Repeat EGD after 8 weeks of PPI therapy to confirm resolution of esophagitis ?2.  Check iron studies, B12, folate, CBC today and if still iron deficient will require capsule study of the small bowel. ? ? ?I have discussed alternative options, risks & benefits,  which include, but are not limited to, bleeding, infection, perforation,respiratory complication & drug reaction.  The patient agrees with this plan & written consent will be obtained.   ? ? ?Dr Jonathon Bellows  MD,MRCP [facility]) ?Follow up in 3 months   ?

## 2021-06-25 LAB — IRON,TIBC AND FERRITIN PANEL
Ferritin: 50 ng/mL (ref 15–150)
Iron Saturation: 24 % (ref 15–55)
Iron: 72 ug/dL (ref 27–139)
Total Iron Binding Capacity: 302 ug/dL (ref 250–450)
UIBC: 230 ug/dL (ref 118–369)

## 2021-06-25 LAB — B12 AND FOLATE PANEL
Folate: >20 ng/mL (ref >3.0)
Vitamin B-12: 619 pg/mL (ref 232–1245)

## 2021-06-27 ENCOUNTER — Telehealth: Payer: Self-pay

## 2021-06-27 NOTE — Telephone Encounter (Signed)
Patient was contacted and was informed of her colonoscopy and EGD results and she is scheduled to have her repeat EGD for 07/16/2021. Patient had no further questions. ?

## 2021-06-27 NOTE — Telephone Encounter (Signed)
-----   Message from Jonathon Bellows, MD sent at 06/09/2021  2:13 PM EST ----- ?Inform the polyps were precancerous i.e. adenomas due to age would not recommend repeating colonoscopy due to age.  Repeat EGD in 8 weeks several ulcers at the GE junction to check for healing after she has been on PPI treatment ?

## 2021-06-28 NOTE — Progress Notes (Signed)
Inform iron studies are normal

## 2021-07-01 ENCOUNTER — Telehealth: Payer: Self-pay

## 2021-07-01 NOTE — Telephone Encounter (Signed)
Called patient to let her know that her iron studies were normal. Patient was also reminded that her EGD is on 07/16/21 and she agreed. ?

## 2021-07-01 NOTE — Telephone Encounter (Signed)
-----   Message from Jonathon Bellows, MD sent at 06/28/2021 11:14 AM EST ----- ?Inform iron studies are normal  ?

## 2021-07-02 DIAGNOSIS — E119 Type 2 diabetes mellitus without complications: Secondary | ICD-10-CM | POA: Diagnosis not present

## 2021-07-02 DIAGNOSIS — N2581 Secondary hyperparathyroidism of renal origin: Secondary | ICD-10-CM | POA: Diagnosis not present

## 2021-07-02 DIAGNOSIS — E1122 Type 2 diabetes mellitus with diabetic chronic kidney disease: Secondary | ICD-10-CM | POA: Diagnosis not present

## 2021-07-02 DIAGNOSIS — N184 Chronic kidney disease, stage 4 (severe): Secondary | ICD-10-CM | POA: Diagnosis not present

## 2021-07-02 DIAGNOSIS — M170 Bilateral primary osteoarthritis of knee: Secondary | ICD-10-CM | POA: Diagnosis not present

## 2021-07-02 DIAGNOSIS — Z794 Long term (current) use of insulin: Secondary | ICD-10-CM | POA: Diagnosis not present

## 2021-07-02 DIAGNOSIS — R809 Proteinuria, unspecified: Secondary | ICD-10-CM | POA: Diagnosis not present

## 2021-07-02 DIAGNOSIS — I129 Hypertensive chronic kidney disease with stage 1 through stage 4 chronic kidney disease, or unspecified chronic kidney disease: Secondary | ICD-10-CM | POA: Diagnosis not present

## 2021-07-02 DIAGNOSIS — D631 Anemia in chronic kidney disease: Secondary | ICD-10-CM | POA: Diagnosis not present

## 2021-07-15 DIAGNOSIS — R809 Proteinuria, unspecified: Secondary | ICD-10-CM | POA: Diagnosis not present

## 2021-07-15 DIAGNOSIS — N1832 Chronic kidney disease, stage 3b: Secondary | ICD-10-CM | POA: Diagnosis not present

## 2021-07-15 DIAGNOSIS — E1122 Type 2 diabetes mellitus with diabetic chronic kidney disease: Secondary | ICD-10-CM | POA: Diagnosis not present

## 2021-07-16 ENCOUNTER — Ambulatory Visit: Payer: HMO | Admitting: Certified Registered Nurse Anesthetist

## 2021-07-16 ENCOUNTER — Encounter
Admission: RE | Disposition: A | Discharge status: Home / Self Care | Payer: Self-pay | Source: Home / Self Care | Attending: Gastroenterology

## 2021-07-16 ENCOUNTER — Ambulatory Visit
Admission: RE | Admit: 2021-07-16 | Discharge: 2021-07-16 | Disposition: A | Discharge status: Home / Self Care | Payer: HMO | Attending: Gastroenterology | Admitting: Gastroenterology

## 2021-07-16 ENCOUNTER — Encounter: Payer: Self-pay | Admitting: Gastroenterology

## 2021-07-16 DIAGNOSIS — K209 Esophagitis, unspecified without bleeding: Secondary | ICD-10-CM | POA: Insufficient documentation

## 2021-07-16 DIAGNOSIS — I509 Heart failure, unspecified: Secondary | ICD-10-CM | POA: Insufficient documentation

## 2021-07-16 DIAGNOSIS — E785 Hyperlipidemia, unspecified: Secondary | ICD-10-CM | POA: Diagnosis not present

## 2021-07-16 DIAGNOSIS — I130 Hypertensive heart and chronic kidney disease with heart failure and stage 1 through stage 4 chronic kidney disease, or unspecified chronic kidney disease: Secondary | ICD-10-CM | POA: Diagnosis not present

## 2021-07-16 DIAGNOSIS — Z794 Long term (current) use of insulin: Secondary | ICD-10-CM | POA: Insufficient documentation

## 2021-07-16 DIAGNOSIS — Z7984 Long term (current) use of oral hypoglycemic drugs: Secondary | ICD-10-CM | POA: Diagnosis not present

## 2021-07-16 DIAGNOSIS — E1122 Type 2 diabetes mellitus with diabetic chronic kidney disease: Secondary | ICD-10-CM | POA: Diagnosis not present

## 2021-07-16 DIAGNOSIS — N189 Chronic kidney disease, unspecified: Secondary | ICD-10-CM | POA: Insufficient documentation

## 2021-07-16 DIAGNOSIS — K219 Gastro-esophageal reflux disease without esophagitis: Secondary | ICD-10-CM | POA: Insufficient documentation

## 2021-07-16 DIAGNOSIS — K449 Diaphragmatic hernia without obstruction or gangrene: Secondary | ICD-10-CM | POA: Diagnosis not present

## 2021-07-16 DIAGNOSIS — D649 Anemia, unspecified: Secondary | ICD-10-CM

## 2021-07-16 DIAGNOSIS — T182XXA Foreign body in stomach, initial encounter: Secondary | ICD-10-CM | POA: Diagnosis not present

## 2021-07-16 HISTORY — PX: ESOPHAGOGASTRODUODENOSCOPY (EGD) WITH PROPOFOL: SHX5813

## 2021-07-16 LAB — GLUCOSE, CAPILLARY: Glucose-Capillary: 170 mg/dL — ABNORMAL HIGH (ref 70–99)

## 2021-07-16 SURGERY — ESOPHAGOGASTRODUODENOSCOPY (EGD) WITH PROPOFOL
Anesthesia: General

## 2021-07-16 MED ORDER — PROPOFOL 10 MG/ML IV BOLUS
INTRAVENOUS | Status: DC | PRN
Start: 2021-07-16 — End: 2021-07-16
  Administered 2021-07-16: 40 mg via INTRAVENOUS

## 2021-07-16 MED ORDER — SODIUM CHLORIDE 0.9 % IV SOLN: INTRAVENOUS | Status: DC

## 2021-07-16 MED ORDER — GLYCOPYRROLATE 0.2 MG/ML IJ SOLN
INTRAMUSCULAR | Status: DC | PRN
  Administered 2021-07-16: .2 mg via INTRAVENOUS

## 2021-07-16 MED ORDER — PROPOFOL 500 MG/50ML IV EMUL
INTRAVENOUS | Status: DC | PRN
  Administered 2021-07-16: 150 ug/kg/min via INTRAVENOUS

## 2021-07-16 MED ORDER — LIDOCAINE HCL (CARDIAC) PF 100 MG/5ML IV SOSY
PREFILLED_SYRINGE | INTRAVENOUS | Status: DC | PRN
  Administered 2021-07-16: 50 mg via INTRAVENOUS

## 2021-07-16 NOTE — Transfer of Care (Signed)
Immediate Anesthesia Transfer of Care Note ? ?Patient: Margaret Payne ? ?Procedure(s) Performed: ESOPHAGOGASTRODUODENOSCOPY (EGD) WITH PROPOFOL ? ?Patient Location: PACU ? ?Anesthesia Type:General ? ?Level of Consciousness: awake, alert  and oriented ? ?Airway & Oxygen Therapy: Patient Spontanous Breathing and Patient connected to nasal cannula oxygen ? ?Post-op Assessment: Report given to RN and Post -op Vital signs reviewed and stable ? ?Post vital signs: Reviewed and stable ? ?Last Vitals:  ?Vitals Value Taken Time  ?BP    ?Temp    ?Pulse 118 07/16/21 0938  ?Resp 22 07/16/21 0938  ?SpO2 100 % 07/16/21 0938  ?Vitals shown include unvalidated device data. ? ?Last Pain:  ?Vitals:  ? 07/16/21 0853  ?TempSrc: Temporal  ?   ? ?  ? ?Complications: No notable events documented. ?

## 2021-07-16 NOTE — H&P (Signed)
? ? ? ?Jonathon Bellows, MD ?[address] [place], [place], [place], [address] ?[address] [place], [place], [place], [address] ?Phone: [phone]  ?Fax: [phone] ? ?Primary Care Physician:  Birdie Sons, MD ? ? ?Pre-Procedure History & Physical: ?HPI:  Margaret Payne is a 81 y.o. female is here for an endoscopy  ?  ?Past Medical History:  ?Diagnosis Date  ? Arthritis   ? CHF (congestive heart failure) ([place])   ? Chronic kidney disease   ? Diabetes mellitus without complication ([place])   ? type 2  ? Headache   ? migrains  ? HOH (hard of hearing)   ? Hyperlipidemia   ? Hypertension   ? Non-ST elevation (NSTEMI) myocardial infarction [facility])   ? ? ?Past Surgical History:  ?Procedure Laterality Date  ? ABDOMINAL HYSTERECTOMY    ? BREAST BIOPSY    ? CARDIAC CATHETERIZATION    ? CATARACT EXTRACTION W/PHACO Right 10/03/2014  ? Procedure: CATARACT EXTRACTION PHACO AND INTRAOCULAR LENS PLACEMENT (IOC);  Surgeon: Birder Robson, MD;  Location: [facility] ORS;  Service: Ophthalmology;  Laterality: Right;  [place] 00:57 ?AP% 16.7 ?CDE 9.52 ?Fluid pack [id number] H  ? COLONOSCOPY WITH PROPOFOL N/A 05/02/2021  ? Procedure: COLONOSCOPY WITH PROPOFOL;  Surgeon: Jonathon Bellows, MD;  Location: [facility] ENDOSCOPY;  Service: Gastroenterology;  Laterality: N/A;  ? 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  ? Procedure: CORONARY STENT INTERVENTION;  Surgeon: Nelva Bush, MD;  Location: [place] CV LAB;  Service: Cardiovascular;  Laterality: N/A;  ? ESOPHAGOGASTRODUODENOSCOPY N/A 05/02/2021  ? Procedure: ESOPHAGOGASTRODUODENOSCOPY (EGD);  Surgeon: Jonathon Bellows, MD;  Location: [facility] ENDOSCOPY;  Service: Gastroenterology;  Laterality: N/A;  ? INJECTION KNEE    ? LEFT HEART CATH AND CORONARY ANGIOGRAPHY N/A 03/13/2020  ? Procedure: LEFT HEART CATH AND CORONARY ANGIOGRAPHY;  Surgeon: Nelva Bush, MD;  Location: [place] CV LAB;  Service: Cardiovascular;  Laterality: N/A;  ? ? ?Prior to Admission medications   ?Medication Sig Start  Date End Date Taking? Authorizing Provider  ?aspirin EC 81 MG EC tablet Take 1 tablet (81 mg total) by mouth daily. Swallow whole. 03/15/20  Yes Donne Hazel, MD  ?atorvastatin (LIPITOR) 80 MG tablet Take 1 tablet by mouth once daily 03/06/21  Yes Gollan, Kathlene November, MD  ?carvedilol (COREG) 3.125 MG tablet TAKE 1 TABLET BY MOUTH TWICE DAILY WITH MEALS 03/06/21  Yes Loel Dubonnet, NP  ?Cholecalciferol (VITAMIN D3) 5000 units TABS Take 5,000 Units by mouth daily.   Yes [provider]  ?Docusate Calcium (STOOL SOFTENER PO) Take by mouth daily as needed.   Yes [provider]  ?Dulaglutide 3 MG/0.5ML SOPN Inject into the skin once a week.   Yes [provider]  ?ferrous sulfate 325 (65 FE) MG tablet Take 1 tablet by mouth daily with breakfast. 06/16/21  Yes [provider]  ?insulin lispro protamine-lispro (HUMALOG 75/25 MIX) (75-25) 100 UNIT/ML SUSP injection Inject into the skin 2 (two) times daily with a meal. 40 units in AM and 20 units in PM   Yes [provider]  ?lisinopril (ZESTRIL) 10 MG tablet Take 1 tablet (10 mg total) by mouth daily. 12/19/20 12/14/21 Yes End, Harrell Gave, MD  ?Multiple Vitamin (MULTI-VITAMINS) TABS Take by mouth daily. One a day vitamin- womens health   Yes [provider]  ?omeprazole (PRILOSEC OTC) 20 MG tablet Take 40 mg by mouth daily.   Yes [provider]  ?acetaminophen (TYLENOL) 500 MG tablet Take 500 mg by mouth every 6 (six)  hours as needed.    [provider]  ? ? ?Allergies as of 06/24/2021 - Review Complete 06/24/2021  ?Allergen Reaction Noted  ? Codeine sulfate Nausea Only 09/27/2014  ? ? ?Family History  ?Problem Relation Age of Onset  ? Breast cancer Mother   ? Emphysema Father   ? Breast cancer Sister 15  ? ? ?Social History  ? ?Socioeconomic History  ? Marital status: Divorced  ?  Spouse name: Not on file  ? Number of children: 1  ? Years of education: Not on file  ? Highest education level:  Associate degree: occupational, Hotel manager, or vocational program  ?Occupational History  ? Occupation: Retired  ?Tobacco Use  ? Smoking status: Never  ? Smokeless tobacco: Never  ?Vaping Use  ? Vaping Use: Never used  ?Substance and Sexual Activity  ? Alcohol use: No  ?  Alcohol/week: 0.0 standard drinks  ? Drug use: No  ? Sexual activity: Not on file  ?Other Topics Concern  ? Not on file  ?Social History Narrative  ? Not on file  ? ?Social Determinants of Health  ? ?Financial Resource Strain: Not on file  ?Food Insecurity: Not on file  ?Transportation Needs: Not on file  ?Physical Activity: Not on file  ?Stress: Not on file  ?Social Connections: Not on file  ?Intimate Partner Violence: Not on file  ? ? ?Review of Systems: ?See HPI, otherwise negative ROS ? ?Physical Exam: ?BP 136/63   Pulse 70   Temp (!) 96.3 ?F (35.7 ?C) (Temporal)   Resp 16   Ht 5' (1.524 m)   Wt 71.2 kg   SpO2 100%   BMI 30.66 kg/m?  ?General:   Alert,  pleasant and cooperative in NAD ?Head:  Normocephalic and atraumatic. ?Neck:  Supple; no masses or thyromegaly. ?Lungs:  Clear throughout to auscultation, normal respiratory effort.    ?Heart:  +S1, +S2, Regular rate and rhythm, No edema. ?Abdomen:  Soft, nontender and nondistended. Normal bowel sounds, without guarding, and without rebound.   ?Neurologic:  Alert and  oriented x4;  grossly normal neurologically. ? ?Impression/Plan: ?Margaret Payne is here for an endoscopy  to be performed for  evaluation of esophagitis ?   ?Risks, benefits, limitations, and alternatives regarding endoscopy have been reviewed with the patient.  Questions have been answered.  All parties agreeable. ? ? ?Jonathon Bellows, MD  07/16/2021, 9:22 AM ? ?

## 2021-07-16 NOTE — Anesthesia Preprocedure Evaluation (Signed)
Anesthesia Evaluation  ?Patient identified by MRN, date of birth, ID band ?Patient awake ? ? ? ?Reviewed: ?Allergy & Precautions, NPO status , Patient's Chart, lab work & pertinent test results ? ?History of Anesthesia Complications ?Negative for: history of anesthetic complications ? ?Airway ?Mallampati: III ? ?TM Distance: <3 FB ?Neck ROM: Full ? ? ? Dental ? ?(+) Dental Advidsory Given, Missing, Poor Dentition ?  ?Pulmonary ?neg pulmonary ROS, neg shortness of breath,  ?  ?Pulmonary exam normal ? ? ? ? ? ? ? Cardiovascular ?hypertension, (-) angina+ CAD, + Past MI, + Cardiac Stents and +CHF  ?Normal cardiovascular exam(-) dysrhythmias (-) Valvular Problems/Murmurs ? ? ?  ?Neuro/Psych ? Headaches, negative psych ROS  ? GI/Hepatic ?Neg liver ROS, GERD  Controlled,  ?Endo/Other  ?diabetes, Oral Hypoglycemic Agents, Insulin Dependent ? Renal/GU ?CRFRenal disease  ? ?  ?Musculoskeletal ? ?(+) Arthritis ,  ? Abdominal ?  ?Peds ? Hematology ?  ?Anesthesia Other Findings ?Past Medical History: ?No date: Arthritis ?No date: CHF (congestive heart failure) ([place]) ?No date: Chronic kidney disease ?No date: Diabetes mellitus without complication ([place]) ?    Comment:  type 2 ?No date: Headache ?    Comment:  migrains ?No date: HOH (hard of hearing) ?No date: Hyperlipidemia ?No date: Hypertension ? ? Reproductive/Obstetrics ?negative OB ROS ? ?  ? ? ? ? ? ? ? ? ? ? ? ? ? ?  ?  ? ? ? ? ? ? ? ? ?Anesthesia Physical ? ?Anesthesia Plan ? ?ASA: 3 ? ?Anesthesia Plan: General  ? ?Post-op Pain Management:   ? ?Induction: Intravenous ? ?PONV Risk Score and Plan: 3 and Propofol infusion and TIVA ? ?Airway Management Planned: Nasal Cannula and Natural Airway ? ?Additional Equipment:  ? ?Intra-op Plan:  ? ?Post-operative Plan:  ? ?Informed Consent: I have reviewed the patients History and Physical, chart, labs and discussed the procedure including the risks, benefits and alternatives for the proposed  anesthesia with the patient or authorized representative who has indicated his/her understanding and acceptance.  ? ? ? ?Dental Advisory Given ? ?Plan Discussed with: Anesthesiologist, CRNA and Surgeon ? ?Anesthesia Plan Comments: (Patient consented for risks of anesthesia including but not limited to:  ?- adverse reactions to medications ?- risk of airway placement if required ?- damage to eyes, teeth, lips or other oral mucosa ?- nerve damage due to positioning  ?- sore throat or hoarseness ?- Damage to heart, brain, nerves, lungs, other parts of body or loss of life ? ?Patient voiced understanding.)  ? ? ? ? ? ? ?Anesthesia Quick Evaluation ? ?

## 2021-07-16 NOTE — Op Note (Signed)
[facility] ?Gastroenterology ?Patient Name: Margaret Payne ?Procedure Date: 07/16/2021 9:22 AM ?MRN: [id number] ?Account #: [id number] ?Date of Birth: [birth year] ?Admit Type: Outpatient ?Age: 81 ?Room: [facility] ENDO ROOM 2 ?Gender: Female ?Note Status: Finalized ?Instrument Name: Upper Endoscope [id number] ?Procedure:             Upper GI endoscopy ?Indications:           Follow-up of esophagitis ?Providers:             Jonathon Bellows MD, MD ?Medicines:             Monitored Anesthesia Care ?Complications:         No immediate complications. ?Procedure:             Pre-Anesthesia Assessment: ?                       - Prior to the procedure, a History and Physical was  ?                       performed, and patient medications, allergies and  ?                       sensitivities were reviewed. The patient's tolerance  ?                       of previous anesthesia was reviewed. ?                       - The risks and benefits of the procedure and the  ?                       sedation options and risks were discussed with the  ?                       patient. All questions were answered and informed  ?                       consent was obtained. ?                       - ASA Grade Assessment: II - A patient with mild  ?                       systemic disease. ?                       After obtaining informed consent, the endoscope was  ?                       passed under direct vision. Throughout the procedure,  ?                       the patient's blood pressure, pulse, and oxygen  ?                       saturations were monitored continuously. The  ?                       Endosonoscope was introduced through the mouth, and  ?  advanced to the third part of duodenum. The upper GI  ?                       endoscopy was accomplished with ease. The patient  ?                       tolerated the procedure well. ?Findings: ?     The examined duodenum was normal. ?     The esophagus was  normal. ?     A medium-sized hiatal hernia was present. ?     A small amount of food (residue) was found on the greater curvature of  ?     the stomach. ?     The cardia and gastric fundus were normal on retroflexion. ?Impression:            - Normal examined duodenum. ?                       - Normal esophagus. ?                       - Medium-sized hiatal hernia. ?                       - A small amount of food (residue) in the stomach. ?                       - No specimens collected. ?Recommendation:        - Discharge patient to home (with escort). ?                       - Resume previous diet. ?                       - Continue present medications. ?                       - Return to my office as previously scheduled. ?Procedure Code(s):     --- Professional --- ?                       [phone], Esophagogastroduodenoscopy, flexible,  ?                       transoral; diagnostic, including collection of  ?                       specimen(s) by brushing or washing, when performed  ?                       (separate procedure) ?Diagnosis Code(s):     --- Professional --- ?                       K44.9, Diaphragmatic hernia without obstruction or  ?                       gangrene ?                       K20.90, Esophagitis, unspecified without bleeding ?CPT copyright 2019 American Medical Association. All rights reserved. ?The codes documented in this report are preliminary and  upon coder review may  ?be revised to meet current compliance requirements. ?Jonathon Bellows, MD ?Jonathon Bellows MD, MD ?07/16/2021 9:37:47 AM ?This report has been signed electronically. ?Number of Addenda: 0 ?Note Initiated On: 07/16/2021 9:22 AM ?Estimated Blood Loss:  Estimated blood loss: none. ?     [facility] ?

## 2021-07-16 NOTE — Anesthesia Postprocedure Evaluation (Signed)
Anesthesia Post Note ? ?Patient: Margaret Payne ? ?Procedure(s) Performed: ESOPHAGOGASTRODUODENOSCOPY (EGD) WITH PROPOFOL ? ?Patient location during evaluation: Endoscopy ?Anesthesia Type: General ?Level of consciousness: awake and alert ?Pain management: pain level controlled ?Vital Signs Assessment: post-procedure vital signs reviewed and stable ?Respiratory status: spontaneous breathing, nonlabored ventilation, respiratory function stable and patient connected to nasal cannula oxygen ?Cardiovascular status: blood pressure returned to baseline and stable ?Postop Assessment: no apparent nausea or vomiting ?Anesthetic complications: no ? ? ?No notable events documented. ? ? ?Last Vitals:  ?Vitals:  ? 07/16/21 0853 07/16/21 0938  ?BP: 136/63 113/63  ?Pulse: 70 (!) 118  ?Resp: 16 (!) 21  ?Temp: (!) 35.7 ?C (!) 36.2 ?C  ?SpO2: 100% 98%  ?  ?Last Pain:  ?Vitals:  ? 07/16/21 0958  ?TempSrc:   ?PainSc: 0-No pain  ? ? ?  ?  ?  ?  ?  ?  ? ?[name] ? ? ? ? ?

## 2021-07-17 ENCOUNTER — Encounter: Payer: Self-pay | Admitting: Gastroenterology

## 2021-08-05 ENCOUNTER — Other Ambulatory Visit: Payer: Self-pay | Admitting: Cardiovascular Disease

## 2021-08-05 DIAGNOSIS — E785 Hyperlipidemia, unspecified: Secondary | ICD-10-CM

## 2021-09-02 ENCOUNTER — Other Ambulatory Visit: Payer: Self-pay | Admitting: Cardiovascular Disease

## 2021-09-02 DIAGNOSIS — E785 Hyperlipidemia, unspecified: Secondary | ICD-10-CM

## 2021-09-06 DIAGNOSIS — E119 Type 2 diabetes mellitus without complications: Secondary | ICD-10-CM | POA: Diagnosis not present

## 2021-09-06 DIAGNOSIS — H2512 Age-related nuclear cataract, left eye: Secondary | ICD-10-CM | POA: Diagnosis not present

## 2021-09-06 LAB — HM DIABETES EYE EXAM

## 2021-10-24 DIAGNOSIS — E1159 Type 2 diabetes mellitus with other circulatory complications: Secondary | ICD-10-CM | POA: Diagnosis not present

## 2021-10-31 DIAGNOSIS — Z794 Long term (current) use of insulin: Secondary | ICD-10-CM | POA: Diagnosis not present

## 2021-10-31 DIAGNOSIS — E782 Mixed hyperlipidemia: Secondary | ICD-10-CM | POA: Diagnosis not present

## 2021-10-31 DIAGNOSIS — N184 Chronic kidney disease, stage 4 (severe): Secondary | ICD-10-CM | POA: Diagnosis not present

## 2021-10-31 DIAGNOSIS — E1159 Type 2 diabetes mellitus with other circulatory complications: Secondary | ICD-10-CM | POA: Diagnosis not present

## 2021-10-31 DIAGNOSIS — E1122 Type 2 diabetes mellitus with diabetic chronic kidney disease: Secondary | ICD-10-CM | POA: Diagnosis not present

## 2021-11-01 DIAGNOSIS — M1712 Unilateral primary osteoarthritis, left knee: Secondary | ICD-10-CM | POA: Diagnosis not present

## 2021-11-12 DIAGNOSIS — N1832 Chronic kidney disease, stage 3b: Secondary | ICD-10-CM | POA: Diagnosis not present

## 2021-11-12 DIAGNOSIS — R809 Proteinuria, unspecified: Secondary | ICD-10-CM | POA: Diagnosis not present

## 2021-11-12 DIAGNOSIS — E1122 Type 2 diabetes mellitus with diabetic chronic kidney disease: Secondary | ICD-10-CM | POA: Diagnosis not present

## 2021-11-18 DIAGNOSIS — R809 Proteinuria, unspecified: Secondary | ICD-10-CM | POA: Diagnosis not present

## 2021-11-18 DIAGNOSIS — N1832 Chronic kidney disease, stage 3b: Secondary | ICD-10-CM | POA: Diagnosis not present

## 2021-11-18 DIAGNOSIS — E1122 Type 2 diabetes mellitus with diabetic chronic kidney disease: Secondary | ICD-10-CM | POA: Diagnosis not present

## 2021-11-18 DIAGNOSIS — I10 Essential (primary) hypertension: Secondary | ICD-10-CM | POA: Diagnosis not present

## 2021-12-09 ENCOUNTER — Other Ambulatory Visit: Payer: Self-pay | Admitting: Family

## 2021-12-09 DIAGNOSIS — I25118 Atherosclerotic heart disease of native coronary artery with other forms of angina pectoris: Secondary | ICD-10-CM

## 2021-12-09 NOTE — Telephone Encounter (Signed)
Pt of Dr. Saunders Revel. Please review for refill. Thank you!

## 2022-01-10 ENCOUNTER — Other Ambulatory Visit: Payer: Self-pay | Admitting: Cardiovascular Disease

## 2022-01-10 DIAGNOSIS — E785 Hyperlipidemia, unspecified: Secondary | ICD-10-CM

## 2022-02-27 LAB — MICROALBUMIN / CREATININE URINE RATIO: Microalb Creat Ratio: 20.4

## 2022-02-27 LAB — COMPREHENSIVE METABOLIC PANEL: eGFR: 35

## 2022-03-25 ENCOUNTER — Other Ambulatory Visit: Payer: Self-pay | Admitting: Internal Medicine

## 2022-03-25 DIAGNOSIS — I255 Ischemic cardiomyopathy: Secondary | ICD-10-CM

## 2022-03-25 DIAGNOSIS — I2510 Atherosclerotic heart disease of native coronary artery without angina pectoris: Secondary | ICD-10-CM

## 2022-04-04 ENCOUNTER — Encounter: Payer: HMO | Admitting: Family Medicine

## 2022-04-08 NOTE — Progress Notes (Unsigned)
I,Margaret Payne,acting as a scribe for Lelon Huh, MD.,have documented all relevant documentation on the behalf of Lelon Huh, MD,as directed by  Lelon Huh, MD while in the presence of Lelon Huh, MD.   Complete Physical Exam      Patient: Margaret Payne, Female    DOB: 02-26-1941, 81 y.o.   MRN: [id number] Visit Date: 04/09/2022  Today's Provider: Lelon Huh, MD    Subjective    Margaret Payne is a 81 y.o. female who presents today for her complete physical examination. She reports consuming a general diet. The patient does not participate in regular exercise at present. She generally feels well. She reports sleeping fairly well. She does not have additional problems to discuss today.    Medications: Outpatient Medications Prior to Visit  Medication Sig   acetaminophen (TYLENOL) 500 MG tablet Take 500 mg by mouth every 6 (six) hours as needed.   aspirin EC 81 MG EC tablet Take 1 tablet (81 mg total) by mouth daily. Swallow whole.   atorvastatin (LIPITOR) 80 MG tablet Take 1 tablet by mouth once daily   carvedilol (COREG) 3.125 MG tablet TAKE 1 TABLET BY MOUTH TWICE DAILY WITH MEALS   Cholecalciferol (VITAMIN D3) 5000 units TABS Take 5,000 Units by mouth daily.   Docusate Calcium (STOOL SOFTENER PO) Take by mouth daily as needed.   Dulaglutide 3 MG/0.5ML SOPN Inject into the skin once a week.   ferrous sulfate 325 (65 FE) MG tablet Take 1 tablet by mouth daily with breakfast.   insulin lispro protamine-lispro (HUMALOG 75/25 MIX) (75-25) 100 UNIT/ML SUSP injection Inject into the skin 2 (two) times daily with a meal. 40 units in AM and 20 units in PM   lisinopril (ZESTRIL) 10 MG tablet Take 1 tablet by mouth once daily   Multiple Vitamin (MULTI-VITAMINS) TABS Take by mouth daily. One a day vitamin- womens health   omeprazole (PRILOSEC OTC) 20 MG tablet Take 40 mg by mouth daily.   No facility-administered medications prior to visit.    Allergies  Allergen  Reactions   Codeine Sulfate Nausea Only    Patient Care Team: Birdie Sons, MD as PCP - General (Family Medicine) End, Harrell Gave, MD as PCP - Cardiology (Cardiology) Solum, Betsey Holiday, MD as Physician Assistant (Internal Medicine) Birder Robson, MD as Referring Physician (Ophthalmology) Lovell Sheehan, MD as Consulting Physician (Orthopedic Surgery) Lavonia Dana, MD as Consulting Physician (Internal Medicine)  Review of Systems  Constitutional:  Negative for chills, diaphoresis and fever.  HENT:  Negative for congestion, ear discharge, ear pain, hearing loss, nosebleeds, sore throat and tinnitus.   Eyes:  Negative for photophobia, pain, discharge and redness.  Respiratory:  Negative for cough, shortness of breath, wheezing and stridor.   Cardiovascular:  Negative for chest pain, palpitations and leg swelling.  Gastrointestinal:  Negative for abdominal pain, blood in stool, constipation, diarrhea, nausea and vomiting.  Endocrine: Negative for polydipsia.  Genitourinary:  Negative for dysuria, flank pain, frequency, hematuria and urgency.  Musculoskeletal:  Negative for back pain, myalgias and neck pain.  Skin:  Negative for rash.  Allergic/Immunologic: Negative for environmental allergies.  Neurological:  Negative for dizziness, tremors, seizures, weakness and headaches.  Hematological:  Does not bruise/bleed easily.  Psychiatric/Behavioral:  Negative for hallucinations and suicidal ideas. The patient is not nervous/anxious.   All other systems reviewed and are negative.       Objective    Vitals: BP (!) 131/56 (BP Location: Right Arm, Patient  Position: Sitting, Cuff Size: Normal)   Pulse 85   Resp 16   Ht 5' (1.524 m)   Wt 164 lb (74.4 kg)   SpO2 99%   BMI 32.03 kg/m     Physical Exam  General Appearance:    Mildly obese female. Alert, cooperative, in no acute distress, appears stated age   Head:    Normocephalic, without obvious abnormality, atraumatic   Eyes:    PERRL, conjunctiva/corneas clear, EOM's intact, fundi    benign, both eyes  Ears:    Normal TM's and external ear canals, both ears  Nose:   Nares normal, septum midline, mucosa normal, no drainage    or sinus tenderness  Throat:   Lips, mucosa, and tongue normal; teeth and gums normal  Neck:   Supple, symmetrical, trachea midline, no adenopathy;    thyroid:  no enlargement/tenderness/nodules; no carotid   bruit or JVD  Back:     Symmetric, no curvature, ROM normal, no CVA tenderness  Lungs:     Clear to auscultation bilaterally, respirations unlabored  Chest Wall:    No tenderness or deformity   Heart:    Normal heart rate. Normal rhythm. No murmurs, rubs, or gallops.   Breast Exam:    deferred  Abdomen:     Soft, non-tender, bowel sounds active all four quadrants,    no masses, no organomegaly  Pelvic:    deferred  Extremities:   All extremities are intact. No cyanosis or edema  Pulses:   2+ and symmetric all extremities  Skin:   Skin color, texture, turgor normal, no rashes or lesions  Lymph nodes:   Cervical, supraclavicular, and axillary nodes normal  Neurologic:   CNII-XII intact, normal strength, sensation and reflexes    throughout      Assessment & Plan     1. Annual physical exam   2. Secondary hyperparathyroidism of renal origin ([place]) Stable continue regular follow up Dr. Abigail Butts  3. Coronary artery disease of native artery of native heart with stable angina pectoris (HCC) Asymptomatic. Compliant with medication.  Continue aggressive risk factor modification.    4. Mixed hyperlipidemia She is tolerating atorvastatin well with no adverse effects.    5. Type 2 diabetes mellitus with diabetic nephropathy, with long-term current use of insulin ([place]) Well controlled.  Continue regular follow up Dr. Nilda Simmer  6. Essential hypertension Well controlled.  Continue current medications.    7. Estrogen deficiency  - DG Bone Density; Future  8. Need for  influenza vaccination She reports she had flu vaccine at [facility]      The entirety of the information documented in the History of Present Illness, Review of Systems and Physical Exam were personally obtained by me. Portions of this information were initially documented by the CMA and reviewed by me for thoroughness and accuracy.     Lelon Huh, MD  [facility] [phone] (phone) [phone] (fax)  [place]

## 2022-04-09 ENCOUNTER — Ambulatory Visit (INDEPENDENT_AMBULATORY_CARE_PROVIDER_SITE_OTHER): Payer: HMO | Admitting: Family Medicine

## 2022-04-09 ENCOUNTER — Encounter: Payer: Self-pay | Admitting: Family Medicine

## 2022-04-09 VITALS — BP 131/56 | HR 85 | Resp 16 | Ht 60.0 in | Wt 164.0 lb

## 2022-04-09 DIAGNOSIS — N2581 Secondary hyperparathyroidism of renal origin: Secondary | ICD-10-CM

## 2022-04-09 DIAGNOSIS — E2839 Other primary ovarian failure: Secondary | ICD-10-CM

## 2022-04-09 DIAGNOSIS — Z0000 Encounter for general adult medical examination without abnormal findings: Secondary | ICD-10-CM

## 2022-04-09 DIAGNOSIS — Z794 Long term (current) use of insulin: Secondary | ICD-10-CM

## 2022-04-09 DIAGNOSIS — I10 Essential (primary) hypertension: Secondary | ICD-10-CM

## 2022-04-09 DIAGNOSIS — E782 Mixed hyperlipidemia: Secondary | ICD-10-CM

## 2022-04-09 DIAGNOSIS — I25118 Atherosclerotic heart disease of native coronary artery with other forms of angina pectoris: Secondary | ICD-10-CM | POA: Diagnosis not present

## 2022-04-09 DIAGNOSIS — Z23 Encounter for immunization: Secondary | ICD-10-CM

## 2022-04-09 DIAGNOSIS — E1121 Type 2 diabetes mellitus with diabetic nephropathy: Secondary | ICD-10-CM

## 2022-04-09 NOTE — Patient Instructions (Signed)
.   Please review the attached list of medications and notify my office if there are any errors.   . Please bring all of your medications to every appointment so we can make sure that our medication list is the same as yours.   

## 2022-04-09 NOTE — Progress Notes (Signed)
Annual Wellness Visit     Patient: Margaret Payne, Female    DOB: Dec 16, 1940, 81 y.o.   MRN: [id number] Visit Date: 04/09/2022  Today's Provider: Lelon Huh, MD   Chief Complaint  Patient presents with   Medicare Wellness   Subjective    Margaret Payne is a 81 y.o. female who presents today for her Annual Wellness Visit.    Medications: Outpatient Medications Prior to Visit  Medication Sig   acetaminophen (TYLENOL) 500 MG tablet Take 500 mg by mouth every 6 (six) hours as needed.   aspirin EC 81 MG EC tablet Take 1 tablet (81 mg total) by mouth daily. Swallow whole.   atorvastatin (LIPITOR) 80 MG tablet Take 1 tablet by mouth once daily   carvedilol (COREG) 3.125 MG tablet TAKE 1 TABLET BY MOUTH TWICE DAILY WITH MEALS   Cholecalciferol (VITAMIN D3) 5000 units TABS Take 5,000 Units by mouth daily.   Docusate Calcium (STOOL SOFTENER PO) Take by mouth daily as needed.   Dulaglutide 3 MG/0.5ML SOPN Inject into the skin once a week.   ferrous sulfate 325 (65 FE) MG tablet Take 1 tablet by mouth daily with breakfast.   insulin lispro protamine-lispro (HUMALOG 75/25 MIX) (75-25) 100 UNIT/ML SUSP injection Inject into the skin 2 (two) times daily with a meal. 40 units in AM and 20 units in PM   lisinopril (ZESTRIL) 10 MG tablet Take 1 tablet by mouth once daily   Multiple Vitamin (MULTI-VITAMINS) TABS Take by mouth daily. One a day vitamin- womens health   omeprazole (PRILOSEC OTC) 20 MG tablet Take 40 mg by mouth daily.   No facility-administered medications prior to visit.    Allergies  Allergen Reactions   Codeine Sulfate Nausea Only    Patient Care Team: Birdie Sons, MD as PCP - General (Family Medicine) End, Harrell Gave, MD as PCP - Cardiology (Cardiology) Gabriel Carina, Betsey Holiday, MD as Physician Assistant (Internal Medicine) Birder Robson, MD as Referring Physician (Ophthalmology) Lovell Sheehan, MD as Consulting Physician (Orthopedic Surgery) Lavonia Dana,  MD as Consulting Physician (Internal Medicine)        Objective     Most recent functional status assessment:    04/09/2022   10:54 AM  In your present state of health, do you have any difficulty performing the following activities:  Hearing? 1  Vision? 0  Difficulty concentrating or making decisions? 0  Walking or climbing stairs? 1  Dressing or bathing? 0  Doing errands, shopping? 0   Most recent fall risk assessment:    04/09/2022   10:53 AM  Fall Risk   Falls in the past year? 0    Most recent depression screenings:    04/09/2022   10:53 AM 04/03/2021   10:08 AM  PHQ 2/9 Scores  PHQ - 2 Score 0 0  PHQ- 9 Score  4   Most recent cognitive screening:    11/07/2016   10:17 AM  6CIT Screen  What Year? 0 points  What month? 0 points  What time? 0 points  Count back from 20 0 points  Months in reverse 0 points  Repeat phrase 0 points  Total Score 0 points   Most recent Audit-C alcohol use screening    04/09/2022   10:55 AM  Alcohol Use Disorder Test (AUDIT)  1. How often do you have a drink containing alcohol? 0   A score of 3 or more in women, and 4 or more in men indicates  increased risk for alcohol abuse, EXCEPT if all of the points are from question 1   Results for orders placed or performed in visit on 04/09/22  Comprehensive metabolic panel  Result Value Ref Range   eGFR 35   Microalbumin / creatinine urine ratio  Result Value Ref Range   Microalb Creat Ratio 20.4     Assessment & Plan     Annual wellness visit done today including the all of the following: Reviewed patient's Family Medical History Reviewed and updated list of patient's medical providers Assessment of cognitive impairment was done Assessed patient's functional ability Established a written schedule for health screening [place] Completed and Reviewed  Exercise Activities and Dietary recommendations  Goals      Exercise 3x per week (30 min per  time)     Recommend to exercise for 3 days a week for at least 30 minutes at a time.         Immunization History  Administered Date(s) Administered   Fluad Quad(high Dose 65+) 01/28/2019   Influenza, High Dose Seasonal PF 02/23/2016, 02/15/2018   Influenza,inj,Quad PF,6+ Mos 01/23/2017, 02/12/2021   Influenza-Unspecified 01/30/2020   Moderna Sars-Covid-2 Vaccination 06/18/2019, 07/16/2019, 02/21/2020   Pneumococcal Conjugate-13 10/11/2015   Pneumococcal Polysaccharide-23 11/10/2017   Respiratory Syncytial Virus Vaccine,Recomb Aduvanted(Arexvy) 02/22/2022   Zoster Recombinat (Shingrix) 11/29/2021   Zoster, Live 04/17/2008    Health Maintenance  Topic Date Due   DTaP/Tdap/Td (1 - Tdap) Never done   HEMOGLOBIN A1C  08/06/2021   COVID-19 Vaccine (4 - 2023-24 season) 12/20/2021   Zoster Vaccines- Shingrix (2 of 2) 01/24/2022   DEXA SCAN  02/23/2022   INFLUENZA VACCINE  07/20/2022 (Originally 11/19/2021)   OPHTHALMOLOGY EXAM  09/07/2022   Diabetic kidney evaluation - eGFR measurement  02/28/2023   Diabetic kidney evaluation - Urine ACR  02/28/2023   Medicare Annual Wellness (AWV)  04/10/2023   Pneumonia Vaccine 76+ Years old  Completed   HPV VACCINES  Aged Out     Discussed health benefits of physical activity, and encouraged her to engage in regular exercise appropriate for her age and condition.        The entirety of the information documented in the History of Present Illness, Review of Systems and Physical Exam were personally obtained by me. Portions of this information were initially documented by the CMA and reviewed by me for thoroughness and accuracy.     Lelon Huh, MD  [facility] [phone] (phone) [phone] (fax)  [place]

## 2022-04-17 ENCOUNTER — Other Ambulatory Visit: Payer: Self-pay | Admitting: Internal Medicine

## 2022-04-17 DIAGNOSIS — E785 Hyperlipidemia, unspecified: Secondary | ICD-10-CM

## 2022-05-13 ENCOUNTER — Ambulatory Visit
Admission: RE | Admit: 2022-05-13 | Discharge: 2022-05-13 | Disposition: A | Discharge status: Home / Self Care | Payer: PPO | Source: Ambulatory Visit | Attending: Family Medicine | Admitting: Family Medicine

## 2022-05-13 DIAGNOSIS — M810 Age-related osteoporosis without current pathological fracture: Secondary | ICD-10-CM | POA: Diagnosis not present

## 2022-05-13 DIAGNOSIS — E2839 Other primary ovarian failure: Secondary | ICD-10-CM | POA: Insufficient documentation

## 2022-05-17 ENCOUNTER — Encounter: Payer: Self-pay | Admitting: Family Medicine

## 2022-05-19 DIAGNOSIS — R809 Proteinuria, unspecified: Secondary | ICD-10-CM | POA: Diagnosis not present

## 2022-05-19 DIAGNOSIS — E1122 Type 2 diabetes mellitus with diabetic chronic kidney disease: Secondary | ICD-10-CM | POA: Diagnosis not present

## 2022-05-19 DIAGNOSIS — N1832 Chronic kidney disease, stage 3b: Secondary | ICD-10-CM | POA: Diagnosis not present

## 2022-05-20 ENCOUNTER — Other Ambulatory Visit: Payer: Self-pay | Admitting: *Deleted

## 2022-05-20 MED ORDER — ALENDRONATE SODIUM 70 MG PO TABS: 70.0000 mg | ORAL_TABLET | ORAL | 12 refills | Status: DC

## 2022-05-26 DIAGNOSIS — I10 Essential (primary) hypertension: Secondary | ICD-10-CM | POA: Diagnosis not present

## 2022-05-26 DIAGNOSIS — M810 Age-related osteoporosis without current pathological fracture: Secondary | ICD-10-CM | POA: Diagnosis not present

## 2022-05-26 DIAGNOSIS — N1832 Chronic kidney disease, stage 3b: Secondary | ICD-10-CM | POA: Diagnosis not present

## 2022-05-26 DIAGNOSIS — E1122 Type 2 diabetes mellitus with diabetic chronic kidney disease: Secondary | ICD-10-CM | POA: Diagnosis not present

## 2022-05-28 DIAGNOSIS — M1712 Unilateral primary osteoarthritis, left knee: Secondary | ICD-10-CM | POA: Diagnosis not present

## 2022-06-13 ENCOUNTER — Encounter: Payer: Self-pay | Admitting: Internal Medicine

## 2022-06-13 ENCOUNTER — Ambulatory Visit: Payer: PPO | Attending: Internal Medicine | Admitting: Internal Medicine

## 2022-06-13 VITALS — BP 126/60 | HR 78 | Ht 59.0 in | Wt 164.5 lb

## 2022-06-13 DIAGNOSIS — E785 Hyperlipidemia, unspecified: Secondary | ICD-10-CM

## 2022-06-13 DIAGNOSIS — I10 Essential (primary) hypertension: Secondary | ICD-10-CM

## 2022-06-13 DIAGNOSIS — N1832 Chronic kidney disease, stage 3b: Secondary | ICD-10-CM

## 2022-06-13 DIAGNOSIS — E1169 Type 2 diabetes mellitus with other specified complication: Secondary | ICD-10-CM | POA: Diagnosis not present

## 2022-06-13 DIAGNOSIS — I2510 Atherosclerotic heart disease of native coronary artery without angina pectoris: Secondary | ICD-10-CM | POA: Diagnosis not present

## 2022-06-13 DIAGNOSIS — I5022 Chronic systolic (congestive) heart failure: Secondary | ICD-10-CM

## 2022-06-13 NOTE — Patient Instructions (Signed)
Medication Instructions:  No changes  *If you need a refill on your cardiac medications before your next appointment, please call your pharmacy*   Lab Work: None ordered If you have labs (blood work) drawn today and your tests are completely normal, you will receive your results only by: [place] (if you have MyChart) OR A paper copy in the mail If you have any lab test that is abnormal or we need to change your treatment, we will call you to review the results.   Testing/Procedures: None ordered   Follow-Up: At [facility], you and your health needs are our priority.  As part of our continuing mission to provide you with exceptional heart care, we have created designated Provider Care Teams.  These Care Teams include your primary Cardiologist (physician) and Advanced Practice Providers (APPs -  Physician Assistants and Nurse Practitioners) who all work together to provide you with the care you need, when you need it.  We recommend signing up for the patient portal called "MyChart".  Sign up information is provided on this After Visit Summary.  MyChart is used to connect with patients for Virtual Visits (Telemedicine).  Patients are able to view lab/test results, encounter notes, upcoming appointments, etc.  Non-urgent messages can be sent to your provider as well.   To learn more about what you can do with MyChart, go to [email or web address].    Your next appointment:   12 month(s)  Provider:   You may see Nelva Bush, MD or one of the following Advanced Practice Providers on your designated Care Team:   Murray Hodgkins, NP Christell Faith, PA-C Cadence Kathlen Mody, PA-C Gerrie Nordmann, NP

## 2022-06-13 NOTE — Progress Notes (Unsigned)
Follow-up Outpatient Visit Date: 06/13/2022  Primary Care Provider: Birdie Sons, MD [address] [place] [address]  Chief Complaint: Follow-up coronary artery disease and cardiomyopathy  HPI:  [name] is a 82 y.o. 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  I last saw her a year ago, at which time she was feeling well.  Today, [name] reports that she has continued to do well without chest pain, shortness of breath, palpitations, and lightheadedness.  She notes mild ankle swelling from time to time, particularly when she is on her feet a lot.  Her weight has been stable.  Home blood pressures are typically around 128/80.  She does not exercise regularly but walks quite a bit at [place].  She remains compliant with her medications and has not noticed any side effects.  --------------------------------------------------------------------------------------------------  Past Medical History:  Diagnosis Date   Arthritis    CHF (congestive heart failure) (HCC)    Chronic kidney disease    Diabetes mellitus without complication (HCC)    type 2   Headache    migrains   HOH (hard of hearing)    Hyperlipidemia    Hypertension    Non-ST elevation (NSTEMI) myocardial infarction [facility])    Past Surgical History:  Procedure Laterality Date   ABDOMINAL HYSTERECTOMY     BREAST BIOPSY     CARDIAC CATHETERIZATION     CATARACT EXTRACTION W/PHACO Right 10/03/2014   Procedure: CATARACT EXTRACTION PHACO AND INTRAOCULAR LENS PLACEMENT ([place]);  Surgeon: Birder Robson, MD;  Location: [facility] ORS;  Service: Ophthalmology;  Laterality: Right;   [place] 00:57 AP% 16.7 CDE 9.52 Fluid pack IN:[id number] H   COLONOSCOPY WITH PROPOFOL N/A 05/02/2021   Procedure: COLONOSCOPY WITH PROPOFOL;  Surgeon: Jonathon Bellows, MD;  Location: [facility] ENDOSCOPY;  Service: Gastroenterology;  Laterality: N/A;   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   Procedure: CORONARY STENT INTERVENTION;  Surgeon: Nelva Bush, MD;  Location: [place] CV LAB;  Service: Cardiovascular;  Laterality: N/A;   ESOPHAGOGASTRODUODENOSCOPY N/A 05/02/2021   Procedure: ESOPHAGOGASTRODUODENOSCOPY (EGD);  Surgeon: Jonathon Bellows, MD;  Location: [facility] ENDOSCOPY;  Service: Gastroenterology;  Laterality: N/A;   ESOPHAGOGASTRODUODENOSCOPY (EGD) WITH PROPOFOL N/A 07/16/2021   Procedure: ESOPHAGOGASTRODUODENOSCOPY (EGD) WITH PROPOFOL;  Surgeon: Jonathon Bellows, MD;  Location: [facility] ENDOSCOPY;  Service: Gastroenterology;  Laterality: N/A;   INJECTION KNEE     LEFT HEART CATH AND CORONARY ANGIOGRAPHY N/A 03/13/2020   Procedure: LEFT HEART CATH AND CORONARY ANGIOGRAPHY;  Surgeon: Nelva Bush, MD;  Location: [place] CV LAB;  Service: Cardiovascular;  Laterality: N/A;    Current Meds  Medication Sig   acetaminophen (TYLENOL) 500 MG tablet Take 500 mg by mouth every 6 (six) hours as needed.   alendronate (FOSAMAX) 70 MG tablet Take 1 tablet (70 mg total) by mouth once a week. Take with a full glass of water on an empty stomach.   aspirin EC 81 MG EC tablet Take 1 tablet (81 mg total) by mouth daily. Swallow whole.   atorvastatin (LIPITOR) 80 MG tablet Take 1 tablet by mouth once daily   carvedilol (COREG) 3.125 MG tablet TAKE 1 TABLET BY MOUTH TWICE DAILY WITH MEALS  Cholecalciferol (VITAMIN D3) 5000 units TABS Take 5,000 Units by mouth daily.   Docusate Calcium (STOOL SOFTENER PO) Take by mouth daily as needed.   Dulaglutide 3 MG/0.5ML SOPN Inject into the skin once a week.   ferrous sulfate 325 (65 FE) MG tablet Take 1 tablet by mouth daily with breakfast.   insulin lispro  protamine-lispro (HUMALOG 75/25 MIX) (75-25) 100 UNIT/ML SUSP injection Inject into the skin 2 (two) times daily with a meal. 40 units in AM and 25 units in PM   lisinopril (ZESTRIL) 10 MG tablet Take 1 tablet by mouth once daily   Multiple Vitamin (MULTI-VITAMINS) TABS Take by mouth daily. One a day vitamin- womens health   omeprazole (PRILOSEC OTC) 20 MG tablet Take 40 mg by mouth daily.    Allergies: Codeine sulfate  Social History   Tobacco Use   Smoking status: Never   Smokeless tobacco: Never  Vaping Use   Vaping Use: Never used  Substance Use Topics   Alcohol use: No    Alcohol/week: 0.0 standard drinks of alcohol   Drug use: No    Family History  Problem Relation Age of Onset   Breast cancer Mother    Emphysema Father    Breast cancer Sister 46    Review of Systems: A 12-system review of systems was performed and was negative except as noted in the HPI.  --------------------------------------------------------------------------------------------------  Physical Exam: BP 126/60 (BP Location: Left Arm, Patient Position: Sitting, Cuff Size: Normal)   Pulse 78   Ht '4\' 11"'$  (1.499 m)   Wt 164 lb 8 oz (74.6 kg)   SpO2 96%   BMI 33.22 kg/m   General:  NAD. Neck: No JVD or HJR. Lungs: Clear to auscultation bilaterally without wheezes or crackles. Heart: Regular rate and rhythm without murmurs, rubs, or gallops. Abdomen: Soft, nontender, nondistended. Extremities: No lower extremity edema.  EKG: Normal sinus rhythm without abnormalities.  Lab Results  Component Value Date   WBC 9.6 03/14/2020   HGB 11.4 (L) 03/14/2020   HCT 34.1 (L) 03/14/2020   MCV 89.7 03/14/2020   PLT 246 03/14/2020    Lab Results  Component Value Date   NA 140 03/14/2020   K 3.9 03/14/2020   CL 107 03/14/2020   CO2 23 03/14/2020   BUN 21 03/14/2020   CREATININE 1.55 (H) 03/14/2020   GLUCOSE 205 (H) 03/14/2020   ALT 22 03/13/2020    Lab Results  Component Value Date   CHOL  170 03/13/2020   HDL 39 (L) 03/13/2020   LDLCALC 91 03/13/2020   TRIG 198 (H) 03/13/2020   CHOLHDL 4.4 03/13/2020    --------------------------------------------------------------------------------------------------  ASSESSMENT AND PLAN: Coronary artery disease: [name] continues to do well without recurrent angina.  Continue aspirin, carvedilol, and atorvastatin for secondary prevention.  Chronic HFrEF with recovered ejection fraction: [name] appears euvolemic today and notes only occasional dependent leg edema.  She does not exercise regularly but is able to walk through stores without any difficulty, consistent with NYHA class II symptoms.  We will continue current doses of carvedilol and lisinopril, as higher doses of GDMT have led to lightheadedness in the past.  Hypertension: Blood pressure well-controlled today.  No medication change at this time.  Hyperlipidemia associated with type 2 diabetes mellitus: Most recent lipid panel in 02/2022 notable for total cholesterol 122, triglycerides 160, HDL 41, and LDL 49.  Continue atorvastatin 80 mg daily.  Chronic kidney disease stage 3b: Renal function stable.  Continue current  dose of lisinopril and ongoing follow-up with nephrology.  Avoid nephrotoxic agents.  Follow-up: Return to clinic in 1 year.   Nelva Bush, MD 06/14/2022 11:35 AM

## 2022-06-14 ENCOUNTER — Encounter: Payer: Self-pay | Admitting: Internal Medicine

## 2022-06-14 DIAGNOSIS — N1832 Chronic kidney disease, stage 3b: Secondary | ICD-10-CM | POA: Insufficient documentation

## 2022-06-14 DIAGNOSIS — I5022 Chronic systolic (congestive) heart failure: Secondary | ICD-10-CM | POA: Insufficient documentation

## 2022-06-19 ENCOUNTER — Other Ambulatory Visit: Payer: Self-pay | Admitting: Internal Medicine

## 2022-06-19 DIAGNOSIS — I2510 Atherosclerotic heart disease of native coronary artery without angina pectoris: Secondary | ICD-10-CM

## 2022-06-19 DIAGNOSIS — I255 Ischemic cardiomyopathy: Secondary | ICD-10-CM

## 2022-06-30 DIAGNOSIS — N184 Chronic kidney disease, stage 4 (severe): Secondary | ICD-10-CM | POA: Diagnosis not present

## 2022-06-30 DIAGNOSIS — Z794 Long term (current) use of insulin: Secondary | ICD-10-CM | POA: Diagnosis not present

## 2022-06-30 DIAGNOSIS — E1122 Type 2 diabetes mellitus with diabetic chronic kidney disease: Secondary | ICD-10-CM | POA: Diagnosis not present

## 2022-07-07 DIAGNOSIS — E1159 Type 2 diabetes mellitus with other circulatory complications: Secondary | ICD-10-CM | POA: Diagnosis not present

## 2022-07-07 DIAGNOSIS — E1122 Type 2 diabetes mellitus with diabetic chronic kidney disease: Secondary | ICD-10-CM | POA: Diagnosis not present

## 2022-07-07 DIAGNOSIS — E11319 Type 2 diabetes mellitus with unspecified diabetic retinopathy without macular edema: Secondary | ICD-10-CM | POA: Diagnosis not present

## 2022-07-07 DIAGNOSIS — N184 Chronic kidney disease, stage 4 (severe): Secondary | ICD-10-CM | POA: Diagnosis not present

## 2022-07-07 DIAGNOSIS — Z794 Long term (current) use of insulin: Secondary | ICD-10-CM | POA: Diagnosis not present

## 2022-07-07 DIAGNOSIS — E1129 Type 2 diabetes mellitus with other diabetic kidney complication: Secondary | ICD-10-CM | POA: Diagnosis not present

## 2022-07-07 DIAGNOSIS — R809 Proteinuria, unspecified: Secondary | ICD-10-CM | POA: Diagnosis not present

## 2022-07-16 ENCOUNTER — Other Ambulatory Visit: Payer: Self-pay

## 2022-07-16 ENCOUNTER — Ambulatory Visit (INDEPENDENT_AMBULATORY_CARE_PROVIDER_SITE_OTHER): Payer: HMO | Admitting: Family Medicine

## 2022-07-16 ENCOUNTER — Telehealth: Payer: Self-pay

## 2022-07-16 ENCOUNTER — Emergency Department: Payer: PPO

## 2022-07-16 ENCOUNTER — Inpatient Hospital Stay
Admission: EM | Admit: 2022-07-16 | Discharge: 2022-07-18 | DRG: 418 | Disposition: A | Discharge status: Home / Self Care | Payer: PPO | Attending: Hospitalist | Admitting: Hospitalist

## 2022-07-16 VITALS — BP 141/59 | HR 114 | Temp 99.5°F | Wt 163.9 lb

## 2022-07-16 DIAGNOSIS — Z955 Presence of coronary angioplasty implant and graft: Secondary | ICD-10-CM

## 2022-07-16 DIAGNOSIS — R1011 Right upper quadrant pain: Secondary | ICD-10-CM

## 2022-07-16 DIAGNOSIS — I5020 Unspecified systolic (congestive) heart failure: Secondary | ICD-10-CM | POA: Diagnosis present

## 2022-07-16 DIAGNOSIS — Z7982 Long term (current) use of aspirin: Secondary | ICD-10-CM

## 2022-07-16 DIAGNOSIS — K810 Acute cholecystitis: Secondary | ICD-10-CM | POA: Diagnosis not present

## 2022-07-16 DIAGNOSIS — K8012 Calculus of gallbladder with acute and chronic cholecystitis without obstruction: Principal | ICD-10-CM | POA: Diagnosis present

## 2022-07-16 DIAGNOSIS — Z79899 Other long term (current) drug therapy: Secondary | ICD-10-CM

## 2022-07-16 DIAGNOSIS — K660 Peritoneal adhesions (postprocedural) (postinfection): Secondary | ICD-10-CM | POA: Diagnosis not present

## 2022-07-16 DIAGNOSIS — N1832 Chronic kidney disease, stage 3b: Secondary | ICD-10-CM

## 2022-07-16 DIAGNOSIS — N184 Chronic kidney disease, stage 4 (severe): Secondary | ICD-10-CM | POA: Diagnosis present

## 2022-07-16 DIAGNOSIS — K8000 Calculus of gallbladder with acute cholecystitis without obstruction: Secondary | ICD-10-CM | POA: Diagnosis not present

## 2022-07-16 DIAGNOSIS — Z7985 Long-term (current) use of injectable non-insulin antidiabetic drugs: Secondary | ICD-10-CM | POA: Diagnosis not present

## 2022-07-16 DIAGNOSIS — Z794 Long term (current) use of insulin: Secondary | ICD-10-CM

## 2022-07-16 DIAGNOSIS — Z885 Allergy status to narcotic agent status: Secondary | ICD-10-CM | POA: Diagnosis not present

## 2022-07-16 DIAGNOSIS — Z6832 Body mass index (BMI) 32.0-32.9, adult: Secondary | ICD-10-CM | POA: Diagnosis not present

## 2022-07-16 DIAGNOSIS — I5022 Chronic systolic (congestive) heart failure: Secondary | ICD-10-CM | POA: Diagnosis present

## 2022-07-16 DIAGNOSIS — Z7983 Long term (current) use of bisphosphonates: Secondary | ICD-10-CM

## 2022-07-16 DIAGNOSIS — M1990 Unspecified osteoarthritis, unspecified site: Secondary | ICD-10-CM | POA: Diagnosis present

## 2022-07-16 DIAGNOSIS — K8001 Calculus of gallbladder with acute cholecystitis with obstruction: Secondary | ICD-10-CM | POA: Diagnosis not present

## 2022-07-16 DIAGNOSIS — I252 Old myocardial infarction: Secondary | ICD-10-CM

## 2022-07-16 DIAGNOSIS — I25118 Atherosclerotic heart disease of native coronary artery with other forms of angina pectoris: Secondary | ICD-10-CM | POA: Diagnosis present

## 2022-07-16 DIAGNOSIS — K828 Other specified diseases of gallbladder: Secondary | ICD-10-CM | POA: Diagnosis present

## 2022-07-16 DIAGNOSIS — I255 Ischemic cardiomyopathy: Secondary | ICD-10-CM | POA: Diagnosis present

## 2022-07-16 DIAGNOSIS — K82A1 Gangrene of gallbladder in cholecystitis: Secondary | ICD-10-CM | POA: Diagnosis not present

## 2022-07-16 DIAGNOSIS — E1165 Type 2 diabetes mellitus with hyperglycemia: Secondary | ICD-10-CM | POA: Diagnosis not present

## 2022-07-16 DIAGNOSIS — E1121 Type 2 diabetes mellitus with diabetic nephropathy: Secondary | ICD-10-CM

## 2022-07-16 DIAGNOSIS — E1122 Type 2 diabetes mellitus with diabetic chronic kidney disease: Secondary | ICD-10-CM | POA: Diagnosis present

## 2022-07-16 DIAGNOSIS — I130 Hypertensive heart and chronic kidney disease with heart failure and stage 1 through stage 4 chronic kidney disease, or unspecified chronic kidney disease: Secondary | ICD-10-CM | POA: Diagnosis present

## 2022-07-16 DIAGNOSIS — I2510 Atherosclerotic heart disease of native coronary artery without angina pectoris: Secondary | ICD-10-CM | POA: Diagnosis not present

## 2022-07-16 DIAGNOSIS — I10 Essential (primary) hypertension: Secondary | ICD-10-CM | POA: Diagnosis not present

## 2022-07-16 DIAGNOSIS — K8020 Calculus of gallbladder without cholecystitis without obstruction: Secondary | ICD-10-CM | POA: Diagnosis not present

## 2022-07-16 DIAGNOSIS — E669 Obesity, unspecified: Secondary | ICD-10-CM | POA: Diagnosis present

## 2022-07-16 DIAGNOSIS — E785 Hyperlipidemia, unspecified: Secondary | ICD-10-CM | POA: Diagnosis present

## 2022-07-16 DIAGNOSIS — Z90710 Acquired absence of both cervix and uterus: Secondary | ICD-10-CM

## 2022-07-16 DIAGNOSIS — I509 Heart failure, unspecified: Secondary | ICD-10-CM | POA: Diagnosis not present

## 2022-07-16 HISTORY — DX: Acute cholecystitis: K81.0

## 2022-07-16 LAB — URINALYSIS, ROUTINE W REFLEX MICROSCOPIC
Bilirubin Urine: NEGATIVE
Glucose, UA: 150 mg/dL — AB
Hgb urine dipstick: NEGATIVE
Ketones, ur: NEGATIVE mg/dL
Leukocytes,Ua: NEGATIVE
Nitrite: NEGATIVE
Protein, ur: NEGATIVE mg/dL
Specific Gravity, Urine: 1.01 (ref 1.005–1.030)
pH: 6 (ref 5.0–8.0)

## 2022-07-16 LAB — CBC
HCT: 39.7 % (ref 36.0–46.0)
Hemoglobin: 13.3 g/dL (ref 12.0–15.0)
MCH: 30.6 pg (ref 26.0–34.0)
MCHC: 33.5 g/dL (ref 30.0–36.0)
MCV: 91.5 fL (ref 80.0–100.0)
Platelets: 300 10*3/uL (ref 150–400)
RBC: 4.34 MIL/uL (ref 3.87–5.11)
RDW: 12.9 % (ref 11.5–15.5)
WBC: 21.1 10*3/uL — ABNORMAL HIGH (ref 4.0–10.5)
nRBC: 0 % (ref 0.0–0.2)

## 2022-07-16 LAB — COMPREHENSIVE METABOLIC PANEL
ALT: 17 U/L (ref 0–44)
AST: 22 U/L (ref 15–41)
Albumin: 3.8 g/dL (ref 3.5–5.0)
Alkaline Phosphatase: 112 U/L (ref 38–126)
Anion gap: 10 (ref 5–15)
BUN: 20 mg/dL (ref 8–23)
CO2: 23 mmol/L (ref 22–32)
Calcium: 9.7 mg/dL (ref 8.9–10.3)
Chloride: 99 mmol/L (ref 98–111)
Creatinine, Ser: 1.5 mg/dL — ABNORMAL HIGH (ref 0.44–1.00)
GFR, Estimated: 35 mL/min — ABNORMAL LOW (ref >60)
Glucose, Bld: 287 mg/dL — ABNORMAL HIGH (ref 70–99)
Potassium: 4.6 mmol/L (ref 3.5–5.1)
Sodium: 132 mmol/L — ABNORMAL LOW (ref 135–145)
Total Bilirubin: 0.9 mg/dL (ref 0.3–1.2)
Total Protein: 7.5 g/dL (ref 6.5–8.1)

## 2022-07-16 LAB — LIPASE, BLOOD: Lipase: 31 U/L (ref 11–51)

## 2022-07-16 LAB — GLUCOSE, CAPILLARY: Glucose-Capillary: 386 mg/dL — ABNORMAL HIGH (ref 70–99)

## 2022-07-16 MED ORDER — CARVEDILOL 6.25 MG PO TABS
3.1250 mg | ORAL_TABLET | Freq: Two times a day (BID) | ORAL | Status: DC
  Administered 2022-07-16 – 2022-07-18 (×2): 3.125 mg via ORAL
  Filled 2022-07-16 (×2): qty 1

## 2022-07-16 MED ORDER — ONDANSETRON HCL 4 MG/2ML IJ SOLN: 4.0000 mg | Freq: Four times a day (QID) | INTRAMUSCULAR | Status: DC | PRN

## 2022-07-16 MED ORDER — ACETAMINOPHEN 325 MG PO TABS
650.0000 mg | ORAL_TABLET | Freq: Four times a day (QID) | ORAL | Status: DC | PRN
  Administered 2022-07-17 – 2022-07-18 (×2): 650 mg via ORAL
  Filled 2022-07-16 (×3): qty 2

## 2022-07-16 MED ORDER — ASPIRIN 81 MG PO TBEC
81.0000 mg | DELAYED_RELEASE_TABLET | Freq: Every day | ORAL | Status: DC
  Administered 2022-07-16 – 2022-07-18 (×2): 81 mg via ORAL
  Filled 2022-07-16 (×2): qty 1

## 2022-07-16 MED ORDER — LISINOPRIL 10 MG PO TABS
10.0000 mg | ORAL_TABLET | Freq: Every day | ORAL | Status: DC
  Administered 2022-07-18: 10 mg via ORAL
  Filled 2022-07-16: qty 1

## 2022-07-16 MED ORDER — ADULT MULTIVITAMIN W/MINERALS CH
1.0000 | ORAL_TABLET | Freq: Every day | ORAL | Status: DC
  Administered 2022-07-16 – 2022-07-18 (×2): 1 via ORAL
  Filled 2022-07-16 (×2): qty 1

## 2022-07-16 MED ORDER — ATORVASTATIN CALCIUM 20 MG PO TABS
80.0000 mg | ORAL_TABLET | Freq: Every day | ORAL | Status: DC
  Administered 2022-07-16 – 2022-07-18 (×2): 80 mg via ORAL
  Filled 2022-07-16 (×2): qty 4

## 2022-07-16 MED ORDER — METRONIDAZOLE 500 MG/100ML IV SOLN
500.0000 mg | Freq: Two times a day (BID) | INTRAVENOUS | Status: DC
  Administered 2022-07-16 – 2022-07-18 (×4): 500 mg via INTRAVENOUS
  Filled 2022-07-16 (×4): qty 100

## 2022-07-16 MED ORDER — SODIUM CHLORIDE 0.9% FLUSH
3.0000 mL | Freq: Two times a day (BID) | INTRAVENOUS | Status: DC
  Administered 2022-07-17 (×2): 3 mL via INTRAVENOUS

## 2022-07-16 MED ORDER — SODIUM CHLORIDE 0.9 % IV SOLN
2.0000 g | INTRAVENOUS | Status: DC
  Administered 2022-07-16 – 2022-07-17 (×2): 2 g via INTRAVENOUS
  Filled 2022-07-16 (×2): qty 20

## 2022-07-16 MED ORDER — PIPERACILLIN-TAZOBACTAM 3.375 G IVPB
3.3750 g | Freq: Once | INTRAVENOUS | Status: DC
  Administered 2022-07-16: 3.375 g via INTRAVENOUS
  Filled 2022-07-16: qty 50

## 2022-07-16 MED ORDER — SODIUM CHLORIDE 0.9 % IV BOLUS
500.0000 mL | Freq: Once | INTRAVENOUS | Status: AC
  Administered 2022-07-16: 500 mL via INTRAVENOUS

## 2022-07-16 MED ORDER — INSULIN GLARGINE-YFGN 100 UNIT/ML ~~LOC~~ SOLN
10.0000 [IU] | Freq: Every day | SUBCUTANEOUS | Status: DC
  Administered 2022-07-16: 10 [IU] via SUBCUTANEOUS
  Filled 2022-07-16 (×2): qty 0.1

## 2022-07-16 MED ORDER — FERROUS SULFATE 325 (65 FE) MG PO TABS
325.0000 mg | ORAL_TABLET | Freq: Every day | ORAL | Status: DC
  Administered 2022-07-18: 325 mg via ORAL
  Filled 2022-07-16: qty 1

## 2022-07-16 MED ORDER — ONDANSETRON HCL 4 MG PO TABS: 4.0000 mg | ORAL_TABLET | Freq: Four times a day (QID) | ORAL | Status: DC | PRN

## 2022-07-16 MED ORDER — ACETAMINOPHEN 650 MG RE SUPP: 650.0000 mg | Freq: Four times a day (QID) | RECTAL | Status: DC | PRN

## 2022-07-16 MED ORDER — POLYETHYLENE GLYCOL 3350 17 G PO PACK: 17.0000 g | PACK | Freq: Every day | ORAL | Status: DC | PRN

## 2022-07-16 MED ORDER — HYDROMORPHONE HCL 1 MG/ML IJ SOLN: 0.5000 mg | INTRAMUSCULAR | Status: DC | PRN

## 2022-07-16 MED ORDER — INSULIN ASPART 100 UNIT/ML IJ SOLN
0.0000 [IU] | Freq: Three times a day (TID) | INTRAMUSCULAR | Status: DC
  Administered 2022-07-17 (×2): 8 [IU] via SUBCUTANEOUS
  Administered 2022-07-18: 2 [IU] via SUBCUTANEOUS
  Filled 2022-07-16 (×3): qty 1

## 2022-07-16 NOTE — Progress Notes (Signed)
I,Joseline E Rosas,acting as a scribe for Lelon Huh, MD.,have documented all relevant documentation on the behalf of Lelon Huh, MD,as directed by  Lelon Huh, MD while in the presence of Lelon Huh, MD.   Established patient visit   Patient: Margaret Payne   DOB: [birth year]   82 y.o. Female  MRN: [id number] Visit Date: 07/16/2022  Today's healthcare provider: Lelon Huh, MD    Subjective    HPI  Patient is an 82 year old female who presents for evaluation of RUQ pain.  She reports that the pain started about 6 pm last night and lasted about 2 hours.  It was preceded by back pain on the same side. She had last eaten about hours before sx onset which was sweet potato. She reports the pain was  10/10 last night and today it is 2/10 (describes as discomfort and soreness). She describes the pain like giving birth.  She took some Tylenol and Pepto which helped a little bit. No Nausea, vomiting, fevers, chills, sweats or bowel changes. Last BM was yesterday morning of normal consistency.   Medications: Outpatient Medications Prior to Visit  Medication Sig   acetaminophen (TYLENOL) 500 MG tablet Take 500 mg by mouth every 6 (six) hours as needed.   alendronate (FOSAMAX) 70 MG tablet Take 1 tablet (70 mg total) by mouth once a week. Take with a full glass of water on an empty stomach.   aspirin EC 81 MG EC tablet Take 1 tablet (81 mg total) by mouth daily. Swallow whole.   atorvastatin (LIPITOR) 80 MG tablet Take 1 tablet by mouth once daily   carvedilol (COREG) 3.125 MG tablet TAKE 1 TABLET BY MOUTH TWICE DAILY WITH MEALS   Cholecalciferol (VITAMIN D3) 5000 units TABS Take 5,000 Units by mouth daily.   Docusate Calcium (STOOL SOFTENER PO) Take by mouth daily as needed.   Dulaglutide 3 MG/0.5ML SOPN Inject into the skin once a week.   ferrous sulfate 325 (65 FE) MG tablet Take 1 tablet by mouth daily with breakfast.   insulin lispro protamine-lispro (HUMALOG 75/25 MIX)  (75-25) 100 UNIT/ML SUSP injection Inject into the skin 2 (two) times daily with a meal. 40 units in AM and 25 units in PM   lisinopril (ZESTRIL) 10 MG tablet Take 1 tablet by mouth once daily   Multiple Vitamin (MULTI-VITAMINS) TABS Take by mouth daily. One a day vitamin- womens health   omeprazole (PRILOSEC OTC) 20 MG tablet Take 40 mg by mouth daily.   No facility-administered medications prior to visit.    Review of Systems  Gastrointestinal:  Negative for blood in stool, constipation, nausea and vomiting.  Genitourinary:  Negative for dysuria and hematuria.       Objective    BP (!) 141/59   Pulse (!) 114   Temp 99.5 F (37.5 C) (Oral)   Wt 163 lb 14.4 oz (74.3 kg)   SpO2 97%   BMI 33.10 kg/m    Physical Exam   General Appearance:    Obese female, alert, cooperative, in no acute distress  Eyes:    PERRL, conjunctiva/corneas clear, EOM's intact       Lungs:     Clear to auscultation bilaterally, respirations unlabored  Heart:    Tachycardic. Normal rhythm. No murmurs, rubs, or gallops.    Abdomen:   bowel sounds present and normal in all 4 quadrants, soft, round. Moderate RUQ tenderness, no rebound. Mild right CVA tenderness.  Assessment & Plan     1. RUQ abdominal pain Avoid fatty foods. Push clear liquids.  - CBC - Comprehensive metabolic panel - Gamma GT - Lipase - US Abdomen Limited RUQ (LIVER/GB); Future      The entirety of the information documented in the History of Present Illness, Review of Systems and Physical Exam were personally obtained by me. Portions of this information were initially documented by the CMA and reviewed by me for thoroughness and accuracy.     Lelon Huh, MD  [place] [phone] (phone) [phone] (fax)  [place]

## 2022-07-16 NOTE — Progress Notes (Signed)
PHARMACY -  BRIEF ANTIBIOTIC NOTE   Pharmacy has received consult(s) for Zosyn from an ED provider.  The patient's profile has been reviewed for ht/wt/allergies/indication/available labs.    One time order(s) placed for Zosyn 3.375 grams IV x 1  Further antibiotics/pharmacy consults should be ordered by admitting physician if indicated.                       Thank you,  Dallie Piles 07/16/2022  3:38 PM

## 2022-07-16 NOTE — Patient Instructions (Signed)
.   Please review the attached list of medications and notify my office if there are any errors.   . Please bring all of your medications to every appointment so we can make sure that our medication list is the same as yours.   

## 2022-07-16 NOTE — Assessment & Plan Note (Signed)
History of HFrEF with recovered EF in the setting of ischemic cardiomyopathy.  She appears euvolemic at this time.

## 2022-07-16 NOTE — ED Triage Notes (Signed)
Pt to ED POV stating "I want a scan of my gallbladder". States last night had pain to mid R abdomen which resolved. No pain at this time.   Pt states had bloodwork today at PCP (Dr Caryn Section who is with [facility]). Pt stating does not want to have blood redrawn but other results are not showing up in chart yet.

## 2022-07-16 NOTE — Telephone Encounter (Signed)
FYI

## 2022-07-16 NOTE — Assessment & Plan Note (Signed)
Patient denies any chest pain, pressure or shortness of breath with exertion or at rest.  She follows up yearly with cardiology.  - Continue home aspirin and statin

## 2022-07-16 NOTE — Assessment & Plan Note (Signed)
>>  ASSESSMENT AND PLAN FOR STAGE 3B CHRONIC KIDNEY DISEASE (CKD) (HCC) WRITTEN ON 07/16/2022  5:27 PM BY ARNETT SAUNDERS, MD  Renal function currently at baseline.  Will follow closely during admission.  - Repeat BMP in the a.m.

## 2022-07-16 NOTE — Telephone Encounter (Signed)
Copied from [place] [id number]. Topic: General - Other >> Jul 16, 2022  3:53 PM Carrielelia G wrote: Reason for CRM: pt will be admitted to the hospital per daughter

## 2022-07-16 NOTE — ED Notes (Signed)
Pt reports abdominal pain on the right side that started about 1800 last night, that felt like "child birth." The pain started in her back and moved around a bit before settling in her abdomin. Pt also reports some abdominal pain on the left side but it was infrequent and has gone away. Pt reports pain being very severe and not being able to sleep last night. Pt went to Dr. Caryn Section and they mentioned getting a scan, but pt didn't want to wait on him, so pt came here to get the scan in case it was her gallbladder because she didn't want it to burst. Pt reports 3/10 abdomen currently. Pt denies any N/V/D. Pt appears in NAD.

## 2022-07-16 NOTE — Consult Note (Signed)
SURGICAL CONSULTATION NOTE   HISTORY OF PRESENT ILLNESS (HPI):  82 y.o. female presented to [facility] ED for evaluation of abdominal pain. Patient reports she developed abdominal pain yesterday at 6 PM.  Pain lasted for about 2 hours.  The pain improved.  The pain was localized to the right upper quadrant.  The pain radiates to her back.  The pain was 10 out of 10.  This morning she went to see her primary care physician who ordered labs and abdomen ultrasound but unable to be done today.  She decided to come to the ED for urgent evaluation due to the persistent pain.  Patient cannot identify any alleviating or aggravating factors.  She denies fever.  At the ED she was found with tenderness on palpation in the right upper quadrant.  She was found with leukocytosis, normal liver enzymes.  She had an ultrasound of the abdomen and pelvis that shows cholelithiasis with large stone in the gallbladder neck.  I personally evaluated the images.  Surgery is consulted by Dr. Charleen Kirks in this context for evaluation and management of acute cholecystitis.  PAST MEDICAL HISTORY (PMH):  Past Medical History:  Diagnosis Date   Arthritis    CHF (congestive heart failure) ([place])    Chronic kidney disease    Diabetes mellitus without complication ([place])    type 2   Headache    migrains   HOH (hard of hearing)    Hyperlipidemia    Hypertension    Non-ST elevation (NSTEMI) myocardial infarction ([place])      PAST SURGICAL HISTORY ([place]):  Past Surgical History:  Procedure Laterality Date   ABDOMINAL HYSTERECTOMY     BREAST BIOPSY     CARDIAC CATHETERIZATION     CATARACT EXTRACTION W/PHACO Right 10/03/2014   Procedure: CATARACT EXTRACTION PHACO AND INTRAOCULAR LENS PLACEMENT ([place]);  Surgeon: Birder Robson, MD;  Location: [facility] ORS;  Service: Ophthalmology;  Laterality: Right;  [place] 00:57 AP% 16.7 CDE 9.52 Fluid pack IN:[id number] H   COLONOSCOPY WITH PROPOFOL N/A 05/02/2021   Procedure: COLONOSCOPY WITH PROPOFOL;   Surgeon: Jonathon Bellows, MD;  Location: [facility] ENDOSCOPY;  Service: Gastroenterology;  Laterality: N/A;   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   Procedure: CORONARY STENT INTERVENTION;  Surgeon: Nelva Bush, MD;  Location: [place] CV LAB;  Service: Cardiovascular;  Laterality: N/A;   ESOPHAGOGASTRODUODENOSCOPY N/A 05/02/2021   Procedure: ESOPHAGOGASTRODUODENOSCOPY (EGD);  Surgeon: Jonathon Bellows, MD;  Location: [facility] ENDOSCOPY;  Service: Gastroenterology;  Laterality: N/A;   ESOPHAGOGASTRODUODENOSCOPY (EGD) WITH PROPOFOL N/A 07/16/2021   Procedure: ESOPHAGOGASTRODUODENOSCOPY (EGD) WITH PROPOFOL;  Surgeon: Jonathon Bellows, MD;  Location: [facility] ENDOSCOPY;  Service: Gastroenterology;  Laterality: N/A;   INJECTION KNEE     LEFT HEART CATH AND CORONARY ANGIOGRAPHY N/A 03/13/2020   Procedure: LEFT HEART CATH AND CORONARY ANGIOGRAPHY;  Surgeon: Nelva Bush, MD;  Location: [place] CV LAB;  Service: Cardiovascular;  Laterality: N/A;     MEDICATIONS:  Prior to Admission medications   Medication Sig Start Date End Date Taking? Authorizing Provider  acetaminophen (TYLENOL) 500 MG tablet Take 1,000 mg by mouth every 4 (four) hours as needed for mild pain.   Yes [provider]  alendronate (FOSAMAX) 70 MG tablet Take 1 tablet (70 mg total) by mouth once a week. Take with a full glass of water on an empty stomach. Patient taking differently: Take 70 mg by mouth every Wednesday. Take with a full glass of water on an empty stomach. 05/20/22  Yes Birdie Sons, MD  aspirin  EC 81 MG EC tablet Take 1 tablet (81 mg total) by mouth daily. Swallow whole. 03/15/20  Yes Donne Hazel, MD  atorvastatin (LIPITOR) 80 MG tablet Take 1 tablet by mouth once daily Patient taking differently: Take 80 mg by mouth daily. 04/17/22  Yes End, Harrell Gave, MD  carvedilol (COREG) 3.125 MG tablet TAKE 1 TABLET BY MOUTH TWICE DAILY WITH MEALS Patient taking differently: Take 3.125 mg by mouth 2 (two) times  daily with a meal. 12/09/21  Yes End, Harrell Gave, MD  Cholecalciferol (VITAMIN D3) 5000 units TABS Take 5,000 Units by mouth daily.   Yes [provider]  Dulaglutide 3 MG/0.5ML SOPN Inject 3 mg into the skin every Thursday.   Yes [provider]  ferrous sulfate 325 (65 FE) MG tablet Take 325 mg by mouth daily with breakfast. 06/16/21  Yes [provider]  insulin lispro protamine-lispro (HUMALOG 75/25 MIX) (75-25) 100 UNIT/ML SUSP injection Inject 25-40 Units into the skin See admin instructions. 40 units in AM and 25 units in PM   Yes [provider]  lisinopril (ZESTRIL) 10 MG tablet Take 1 tablet by mouth once daily 06/19/22  Yes End, Harrell Gave, MD  Multiple Vitamin (MULTI-VITAMINS) TABS Take 1 tablet by mouth daily.   Yes [provider]  omeprazole (PRILOSEC OTC) 20 MG tablet Take 40 mg by mouth daily.   Yes [provider]     ALLERGIES:  Allergies  Allergen Reactions   Codeine Sulfate Nausea Only     SOCIAL HISTORY:  Social History   Socioeconomic History   Marital status: Divorced    Spouse name: Not on file   Number of children: 1   Years of education: Not on file   Highest education level: Associate degree: occupational, Hotel manager, or vocational program  Occupational History   Occupation: Retired  Tobacco Use   Smoking status: Never   Smokeless tobacco: Never  Vaping Use   Vaping Use: Never used  Substance and Sexual Activity   Alcohol use: No    Alcohol/week: 0.0 standard drinks of alcohol   Drug use: No   Sexual activity: Not on file  Other Topics Concern   Not on file  Social History Narrative   Not on file   Social Determinants of Health   Financial Resource Strain: Low Risk  (11/29/2019)   Overall Financial Resource Strain (CARDIA)    Difficulty of Paying Living Expenses: Not hard at all  Food Insecurity: No Food Insecurity (11/29/2019)   Hunger Vital Sign    Worried About Running Out of Food in the  Last Year: Never true    [place] in the Last Year: Never true  Transportation Needs: No Transportation Needs (11/29/2019)   PRAPARE - Hydrologist (Medical): No    Lack of Transportation (Non-Medical): No  Physical Activity: Inactive (11/29/2019)   Exercise Vital Sign    Days of Exercise per Week: 0 days    Minutes of Exercise per Session: 0 min  Stress: No Stress Concern Present (11/29/2019)   [place]    Feeling of Stress : Not at all  Social Connections: Moderately Isolated (11/29/2019)   Social Connection and Isolation Panel [NHANES]    Frequency of Communication with Friends and Family: More than three times a week    Frequency of Social Gatherings with Friends and Family: More than three times a week    Attends Religious Services: Never  Active Member of Clubs or Organizations: Yes    Attends Archivist Meetings: More than 4 times per year    Marital Status: Divorced  Intimate Partner Violence: Not At Risk (11/29/2019)   Humiliation, Afraid, Rape, and Kick questionnaire    Fear of Current or Ex-Partner: No    Emotionally Abused: No    Physically Abused: No    Sexually Abused: No      FAMILY HISTORY:  Family History  Problem Relation Age of Onset   Breast cancer Mother    Emphysema Father    Breast cancer Sister 75     REVIEW OF SYSTEMS:  Constitutional: denies weight loss, fever, chills, or sweats  Eyes: denies any other vision changes, history of eye injury  ENT: denies sore throat, hearing problems  Respiratory: denies shortness of breath, wheezing  Cardiovascular: denies chest pain, palpitations  Gastrointestinal: Positive abdominal pain, nausea and vomiting Genitourinary: denies burning with urination or urinary frequency Musculoskeletal: denies any other joint pains or cramps  Skin: denies any other rashes or skin discolorations  Neurological:  denies any other headache, dizziness, weakness  Psychiatric: denies any other depression, anxiety   All other review of systems were negative   VITAL SIGNS:  Temp:  [98.6 F (37 C)-99.5 F (37.5 C)] 99.4 F (37.4 C) (03/27 1549) Pulse Rate:  [37-114] 99 (03/27 1700) Resp:  [16-31] 31 (03/27 1700) BP: (111-141)/(54-72) 119/56 (03/27 1700) SpO2:  [92 %-100 %] 95 % (03/27 1700) Weight:  [73.9 kg-74.3 kg] 73.9 kg (03/27 1116)     Height: 4\' 11"  (149.9 cm) Weight: 73.9 kg BMI (Calculated): 32.9   INTAKE/OUTPUT:  This shift: No intake/output data recorded.  Last 2 shifts: @IOLAST2SHIFTS @   PHYSICAL EXAM:  Constitutional:  -- Normal body habitus  -- Awake, alert, and oriented x3  Eyes:  -- Pupils equally round and reactive to light  -- No scleral icterus  Ear, nose, and throat:  -- No jugular venous distension  Pulmonary:  -- No crackles  -- Equal breath sounds bilaterally -- Breathing non-labored at rest Cardiovascular:  -- S1, S2 present  -- No pericardial rubs Gastrointestinal:  -- Abdomen soft, tender to palpation in the right upper quadrant, non-distended, no guarding or rebound tenderness -- No abdominal masses appreciated, pulsatile or otherwise  Musculoskeletal and Integumentary:  -- Wounds: None appreciated -- Extremities: B/L UE and LE FROM, hands and feet warm, no edema  Neurologic:  -- Motor function: intact and symmetric -- Sensation: intact and symmetric   Labs:     Latest Ref Rng & Units 07/16/2022   11:23 AM 03/14/2020    4:45 AM 03/13/2020    5:10 AM  CBC  WBC 4.0 - 10.5 K/uL 21.1  9.6  10.3   Hemoglobin 12.0 - 15.0 g/dL 13.3  11.4  12.2   Hematocrit 36.0 - 46.0 % 39.7  34.1  37.1   Platelets 150 - 400 K/uL 300  246  286       Latest Ref Rng & Units 07/16/2022   11:23 AM 03/14/2020    4:45 AM 03/13/2020    5:10 AM  CMP  Glucose 70 - 99 mg/dL 287  205  177   BUN 8 - 23 mg/dL 20  21  21    Creatinine 0.44 - 1.00 mg/dL 1.50  1.55  1.60   Sodium  135 - 145 mmol/L 132  140  137   Potassium 3.5 - 5.1 mmol/L 4.6  3.9  4.1  Chloride 98 - 111 mmol/L 99  107  108   CO2 22 - 32 mmol/L 23  23  23    Calcium 8.9 - 10.3 mg/dL 9.7  8.8  8.9   Total Protein 6.5 - 8.1 g/dL 7.5   6.7   Total Bilirubin 0.3 - 1.2 mg/dL 0.9   0.7   Alkaline Phos 38 - 126 U/L 112   74   AST 15 - 41 U/L 22   68   ALT 0 - 44 U/L 17   22     Imaging studies:  EXAM: ABDOMEN ULTRASOUND COMPLETE   COMPARISON:  Renal ultrasound dated 01/28/2019   FINDINGS: Gallbladder: Cholelithiasis with large stone at the gallbladder neck. No gallbladder wall thickening. No sonographic Murphy sign noted by sonographer.   Common bile duct: Diameter: 2 mm   Liver: No focal lesion identified. Within normal limits in parenchymal echogenicity. Portal vein is patent on color Doppler imaging with normal direction of blood flow towards the liver.   IVC: No abnormality visualized.   Pancreas: Diffusely increased parenchymal echogenicity.   Spleen: Size and appearance within normal limits.   Right Kidney: Length: 11.1 cm. No urinary tract dilation or shadowing calculi. Renal cortical thinning.   Left Kidney: Length: 10.0 cm. No urinary tract dilation or shadowing calculi. Renal cortical thinning.   Abdominal aorta: No aneurysm visualized proximally. Not well seen distally due to overlying bowel gas.   Other findings: None.   IMPRESSION: 1. Cholelithiasis with large stone at the gallbladder neck. No sonographic finding of acute cholecystitis. 2. Partially imaged pancreas appears diffusely echogenic. While nonspecific, this finding can be associated with hepatic steatosis and prediabetes/diabetes. 3. Bilateral renal cortical thinning.  No hydronephrosis.     Electronically Signed   By: Darrin Nipper M.D.   On: 07/16/2022 13:22  Assessment/Plan:  82 y.o. female with acute cholecystitis, complicated by pertinent comorbidities including type 2 diabetes, coronary artery  disease, hypertension, chronic kidney disease.  Patient with history, physical exam and images consistent with acute cholecystitis. Patient oriented about diagnosis and surgical management as treatment.   Discussed the risk of surgery including post-op infxn, seroma, biloma, chronic pain, poor-delayed wound healing, retained gallstone, conversion to open procedure, post-op SBO or ileus, and need for additional procedures to address said risks.  The risks of general anesthetic including MI, CVA, sudden death or even reaction to anesthetic medications also discussed. Alternatives include continued observation.  Benefits include possible symptom relief, prevention of complications including acute cholecystitis, pancreatitis.  Patient seems to be medically stable.  I reviewed the last evaluation by cardiology on 06/13/2022.  As per the cardiologist she has been stable from her chronic CHF.  No adjustment of medication or any therapy.  I think that the benefits of the surgery are higher than the risk since patient had acute cholecystitis with leukocytosis and the gallstone stuck in the gallbladder neck.  I discussed these recommendation and the risks of surgery with the patient and her daughter and they agreed to proceed with cholecystectomy.  Arnold Long, MD

## 2022-07-16 NOTE — Assessment & Plan Note (Signed)
>>  ASSESSMENT AND PLAN FOR CORONARY ARTERY DISEASE WRITTEN ON 07/16/2022  5:29 PM BY Avi Body, MD  Patient denies any chest pain, pressure or shortness of breath with exertion or at rest.  She follows up yearly with cardiology.  - Continue home aspirin  and statin

## 2022-07-16 NOTE — Assessment & Plan Note (Signed)
-   Hold home antiglycemic agents - A1c pending - SSI, moderate - Semglee 10 units at bedtime

## 2022-07-16 NOTE — Assessment & Plan Note (Signed)
Continue home antihypertensives 

## 2022-07-16 NOTE — ED Provider Notes (Signed)
[facility] Provider Note    Event Date/Time   First MD Initiated Contact with Patient 07/16/22 1203     (approximate)   History   Abdominal Pain   HPI  Margaret Payne is a 82 y.o. female past medical history of diabetes congestive heart failure chronic kidney disease NSTEMI  Patient reports last night about 6 PM she had started having fairly severe pain in her right upper abdomen.  A little bit of radiation towards the back but most of the right upper abdomen.  She took Tylenol and reports the pain was very severe and now this morning it is calm down she is feels a little bit sore in the area but much better.  Does not wish for any pain medicine.  She never had pain like this before.  She went and saw Dr. Caryn Section today and he advised that per the patient she was to get an ultrasound of the gallbladder.  Patient talked to her daughter who recommend she come to the ER to get that test completed relatively quickly.  She currently endorses her symptoms are much better but still little bit of a slight aching in the area points towards the middle of her right upper abdomen.  Denies radiation to the back no pain or burning with urination has not noticed any fevers       Physical Exam   Triage Vital Signs: ED Triage Vitals [07/16/22 1116]  Enc Vitals Group     BP 119/72     Pulse Rate (!) 109     Resp 16     Temp 98.6 F (37 C)     Temp Source Oral     SpO2 96 %     Weight 163 lb (73.9 kg)     Height 4\' 11"  (1.499 m)     Head Circumference      Peak Flow      Pain Score 0     Pain Loc      Pain Edu?      Excl. in [place]?     Most recent vital signs: Vitals:   07/16/22 1530 07/16/22 1549  BP: (!) 125/54 (!) 125/54  Pulse: 96 98  Resp:  (!) 24  Temp:  99.4 F (37.4 C)  SpO2: 96% 98%     General: Awake, no distress.  CV:  Good peripheral perfusion.  Normal tones and rate Resp:  Normal effort.  Speaking full clear sentences without  distress Abd:  No distention.  Mild discomfort she reports it feels like a soreness or aching when palpated in the right upper quadrant.  No pain in other quadrants or epigastrium. Other:     ED Results / Procedures / Treatments   Labs (all labs ordered are listed, but only abnormal results are displayed) Labs Reviewed  COMPREHENSIVE METABOLIC PANEL - Abnormal; Notable for the following components:      Result Value   Sodium 132 (*)    Glucose, Bld 287 (*)    Creatinine, Ser 1.50 (*)    GFR, Estimated 35 (*)    All other components within normal limits  CBC - Abnormal; Notable for the following components:   WBC 21.1 (*)    All other components within normal limits  URINALYSIS, ROUTINE W REFLEX MICROSCOPIC - Abnormal; Notable for the following components:   Color, Urine STRAW (*)    APPearance CLEAR (*)    Glucose, UA 150 (*)    All other  components within normal limits  LIPASE, BLOOD     EKG     RADIOLOGY  Ultrasound interpreted by me as positive for gallstone.  Defer to radiologist for detailed read  US Abdomen Complete  Result Date: 07/16/2022 CLINICAL DATA:  One day history of right upper quadrant pain EXAM: ABDOMEN ULTRASOUND COMPLETE COMPARISON:  Renal ultrasound dated 01/28/2019 FINDINGS: Gallbladder: Cholelithiasis with large stone at the gallbladder neck. No gallbladder wall thickening. No sonographic Murphy sign noted by sonographer. Common bile duct: Diameter: 2 mm Liver: No focal lesion identified. Within normal limits in parenchymal echogenicity. Portal vein is patent on color Doppler imaging with normal direction of blood flow towards the liver. IVC: No abnormality visualized. Pancreas: Diffusely increased parenchymal echogenicity. Spleen: Size and appearance within normal limits. Right Kidney: Length: 11.1 cm. No urinary tract dilation or shadowing calculi. Renal cortical thinning. Left Kidney: Length: 10.0 cm. No urinary tract dilation or shadowing calculi.  Renal cortical thinning. Abdominal aorta: No aneurysm visualized proximally. Not well seen distally due to overlying bowel gas. Other findings: None. IMPRESSION: 1. Cholelithiasis with large stone at the gallbladder neck. No sonographic finding of acute cholecystitis. 2. Partially imaged pancreas appears diffusely echogenic. While nonspecific, this finding can be associated with hepatic steatosis and prediabetes/diabetes. 3. Bilateral renal cortical thinning.  No hydronephrosis. Electronically Signed   By: Darrin Nipper M.D.   On: 07/16/2022 13:22      PROCEDURES:  Critical Care performed: No  Procedures   MEDICATIONS ORDERED IN ED: Medications  piperacillin-tazobactam (ZOSYN) IVPB 3.375 g (3.375 g Intravenous New Bag/Given 07/16/22 1549)  sodium chloride 0.9 % bolus 500 mL (500 mLs Intravenous New Bag/Given 07/16/22 1447)     IMPRESSION / MDM / ASSESSMENT AND PLAN / ED COURSE  I reviewed the triage vital signs and the nursing notes.                              Differential diagnosis includes, but is not limited to, cholelithiasis, cholecystitis, hepatitis, diverticulitis, nephrolithiasis pyelonephritis, or other acute intra-abdominal causation's.  No midline abdominal pain.  Appears warm well-perfused.  Does not appear in distress reports pain is improved.  Labs are notable for significant leukocytosis and also chronic renal disease.  Normal LFTs and lipase  Will proceed by obtaining ultrasound of the abdomen in particular detail to the right upper quadrant and right kidney.  However, if this imaging is negative given her leukocytosis and report of relatively severe pain that she had last night I would proceed with CT imaging.  Patient agreeable understanding with the plan moving forward  Patient's presentation is most consistent with acute complicated illness / injury requiring diagnostic workup.   Consulted with and patient requested to be admitted to the hospital assertiveness with  surgery providing consult today per Dr. Peyton Najjar.  He does recommend start patient on Zosyn as we await surgical consultation  Patient and her daughter both understanding agreeable with plan for admission.  Currently pain-free.  Zosyn initiating.  Affirm no allergies except codeine.  Dr. Charleen Kirks has been consulted and will admit patient to the hospitalist service  Symptomatic cholelithiasis, suspect perhaps having early evidence of cholecystitis potentially developing.  No evidence by labs of acute biliary obstruction     FINAL CLINICAL IMPRESSION(S) / ED DIAGNOSES   Final diagnoses:  Calculus of gallbladder with acute cholecystitis without obstruction     Rx / DC Orders   ED Discharge Orders  None        Note:  This document was prepared using Dragon voice recognition software and may include unintentional dictation errors.   Delman Kitten, MD 07/16/22 1556

## 2022-07-16 NOTE — Assessment & Plan Note (Signed)
Renal function currently at baseline.  Will follow closely during admission.  - Repeat BMP in the a.m.

## 2022-07-16 NOTE — Assessment & Plan Note (Addendum)
Patient presenting with sudden onset, waxing and waning right upper quadrant pain with evidence of a large gallbladder calculus in the gallbladder neck. Significantly elevated WBC concerning for acute cholecystitis despite lack of negative sonographic findings  Revised cardiac risk index score of 4, class IV risk.  - General surgery consulted; appreciate their recommendations - N.p.o. - Given history of CKD, will switch from Zosyn and metronidazole

## 2022-07-16 NOTE — H&P (View-Only) (Signed)
SURGICAL CONSULTATION NOTE   HISTORY OF PRESENT ILLNESS (HPI):  82 y.o. female presented to [facility] ED for evaluation of abdominal pain. Patient reports she developed abdominal pain yesterday at 6 PM.  Pain lasted for about 2 hours.  The pain improved.  The pain was localized to the right upper quadrant.  The pain radiates to her back.  The pain was 10 out of 10.  This morning she went to see her primary care physician who ordered labs and abdomen ultrasound but unable to be done today.  She decided to come to the ED for urgent evaluation due to the persistent pain.  Patient cannot identify any alleviating or aggravating factors.  She denies fever.  At the ED she was found with tenderness on palpation in the right upper quadrant.  She was found with leukocytosis, normal liver enzymes.  She had an ultrasound of the abdomen and pelvis that shows cholelithiasis with large stone in the gallbladder neck.  I personally evaluated the images.  Surgery is consulted by Dr. Charleen Kirks in this context for evaluation and management of acute cholecystitis.  PAST MEDICAL HISTORY (PMH):  Past Medical History:  Diagnosis Date   Arthritis    CHF (congestive heart failure) ([place])    Chronic kidney disease    Diabetes mellitus without complication ([place])    type 2   Headache    migrains   HOH (hard of hearing)    Hyperlipidemia    Hypertension    Non-ST elevation (NSTEMI) myocardial infarction ([place])      PAST SURGICAL HISTORY ([place]):  Past Surgical History:  Procedure Laterality Date   ABDOMINAL HYSTERECTOMY     BREAST BIOPSY     CARDIAC CATHETERIZATION     CATARACT EXTRACTION W/PHACO Right 10/03/2014   Procedure: CATARACT EXTRACTION PHACO AND INTRAOCULAR LENS PLACEMENT ([place]);  Surgeon: Birder Robson, MD;  Location: [facility] ORS;  Service: Ophthalmology;  Laterality: Right;  [place] 00:57 AP% 16.7 CDE 9.52 Fluid pack IN:[id number] H   COLONOSCOPY WITH PROPOFOL N/A 05/02/2021   Procedure: COLONOSCOPY WITH PROPOFOL;   Surgeon: Jonathon Bellows, MD;  Location: [facility] ENDOSCOPY;  Service: Gastroenterology;  Laterality: N/A;   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   Procedure: CORONARY STENT INTERVENTION;  Surgeon: Nelva Bush, MD;  Location: [facility] LAB;  Service: Cardiovascular;  Laterality: N/A;   ESOPHAGOGASTRODUODENOSCOPY N/A 05/02/2021   Procedure: ESOPHAGOGASTRODUODENOSCOPY (EGD);  Surgeon: Jonathon Bellows, MD;  Location: [facility] ENDOSCOPY;  Service: Gastroenterology;  Laterality: N/A;   ESOPHAGOGASTRODUODENOSCOPY (EGD) WITH PROPOFOL N/A 07/16/2021   Procedure: ESOPHAGOGASTRODUODENOSCOPY (EGD) WITH PROPOFOL;  Surgeon: Jonathon Bellows, MD;  Location: [facility] ENDOSCOPY;  Service: Gastroenterology;  Laterality: N/A;   INJECTION KNEE     LEFT HEART CATH AND CORONARY ANGIOGRAPHY N/A 03/13/2020   Procedure: LEFT HEART CATH AND CORONARY ANGIOGRAPHY;  Surgeon: Nelva Bush, MD;  Location: [place] [facility];  Service: Cardiovascular;  Laterality: N/A;     MEDICATIONS:  Prior to Admission medications   Medication Sig Start Date End Date Taking? Authorizing Provider  acetaminophen (TYLENOL) 500 MG tablet Take 1,000 mg by mouth every 4 (four) hours as needed for mild pain.   Yes [provider]  alendronate (FOSAMAX) 70 MG tablet Take 1 tablet (70 mg total) by mouth once a week. Take with a full glass of water on an empty stomach. Patient taking differently: Take 70 mg by mouth every Wednesday. Take with a full glass of water on an empty stomach. 05/20/22  Yes Birdie Sons, MD  aspirin  EC 81 MG EC tablet Take 1 tablet (81 mg total) by mouth daily. Swallow whole. 03/15/20  Yes Donne Hazel, MD  atorvastatin (LIPITOR) 80 MG tablet Take 1 tablet by mouth once daily Patient taking differently: Take 80 mg by mouth daily. 04/17/22  Yes End, Harrell Gave, MD  carvedilol (COREG) 3.125 MG tablet TAKE 1 TABLET BY MOUTH TWICE DAILY WITH MEALS Patient taking differently: Take 3.125 mg by mouth 2 (two) times  daily with a meal. 12/09/21  Yes End, Harrell Gave, MD  Cholecalciferol (VITAMIN D3) 5000 units TABS Take 5,000 Units by mouth daily.   Yes [provider]  Dulaglutide 3 MG/0.5ML SOPN Inject 3 mg into the skin every Thursday.   Yes [provider]  ferrous sulfate 325 (65 FE) MG tablet Take 325 mg by mouth daily with breakfast. 06/16/21  Yes [provider]  insulin lispro protamine-lispro (HUMALOG 75/25 MIX) (75-25) 100 UNIT/ML SUSP injection Inject 25-40 Units into the skin See admin instructions. 40 units in AM and 25 units in PM   Yes [provider]  lisinopril (ZESTRIL) 10 MG tablet Take 1 tablet by mouth once daily 06/19/22  Yes End, Harrell Gave, MD  Multiple Vitamin (MULTI-VITAMINS) TABS Take 1 tablet by mouth daily.   Yes [provider]  omeprazole (PRILOSEC OTC) 20 MG tablet Take 40 mg by mouth daily.   Yes [provider]     ALLERGIES:  Allergies  Allergen Reactions   Codeine Sulfate Nausea Only     SOCIAL HISTORY:  Social History   Socioeconomic History   Marital status: Divorced    Spouse name: Not on file   Number of children: 1   Years of education: Not on file   Highest education level: Associate degree: occupational, Hotel manager, or vocational program  Occupational History   Occupation: Retired  Tobacco Use   Smoking status: Never   Smokeless tobacco: Never  Vaping Use   Vaping Use: Never used  Substance and Sexual Activity   Alcohol use: No    Alcohol/week: 0.0 standard drinks of alcohol   Drug use: No   Sexual activity: Not on file  Other Topics Concern   Not on file  Social History Narrative   Not on file   Social Determinants of Health   Financial Resource Strain: Low Risk  (11/29/2019)   Overall Financial Resource Strain (CARDIA)    Difficulty of Paying Living Expenses: Not hard at all  Food Insecurity: No Food Insecurity (11/29/2019)   Hunger Vital Sign    Worried About Running Out of Food in the  Last Year: Never true    [place] in the Last Year: Never true  Transportation Needs: No Transportation Needs (11/29/2019)   PRAPARE - Hydrologist (Medical): No    Lack of Transportation (Non-Medical): No  Physical Activity: Inactive (11/29/2019)   Exercise Vital Sign    Days of Exercise per Week: 0 days    Minutes of Exercise per Session: 0 min  Stress: No Stress Concern Present (11/29/2019)   [place]    Feeling of Stress : Not at all  Social Connections: Moderately Isolated (11/29/2019)   Social Connection and Isolation Panel [NHANES]    Frequency of Communication with Friends and Family: More than three times a week    Frequency of Social Gatherings with Friends and Family: More than three times a week    Attends Religious Services: Never  Active Member of Clubs or Organizations: Yes    Attends Archivist Meetings: More than 4 times per year    Marital Status: Divorced  Intimate Partner Violence: Not At Risk (11/29/2019)   Humiliation, Afraid, Rape, and Kick questionnaire    Fear of Current or Ex-Partner: No    Emotionally Abused: No    Physically Abused: No    Sexually Abused: No      FAMILY HISTORY:  Family History  Problem Relation Age of Onset   Breast cancer Mother    Emphysema Father    Breast cancer Sister 69     REVIEW OF SYSTEMS:  Constitutional: denies weight loss, fever, chills, or sweats  Eyes: denies any other vision changes, history of eye injury  ENT: denies sore throat, hearing problems  Respiratory: denies shortness of breath, wheezing  Cardiovascular: denies chest pain, palpitations  Gastrointestinal: Positive abdominal pain, nausea and vomiting Genitourinary: denies burning with urination or urinary frequency Musculoskeletal: denies any other joint pains or cramps  Skin: denies any other rashes or skin discolorations  Neurological:  denies any other headache, dizziness, weakness  Psychiatric: denies any other depression, anxiety   All other review of systems were negative   VITAL SIGNS:  Temp:  [98.6 F (37 C)-99.5 F (37.5 C)] 99.4 F (37.4 C) (03/27 1549) Pulse Rate:  [37-114] 99 (03/27 1700) Resp:  [16-31] 31 (03/27 1700) BP: (111-141)/(54-72) 119/56 (03/27 1700) SpO2:  [92 %-100 %] 95 % (03/27 1700) Weight:  [73.9 kg-74.3 kg] 73.9 kg (03/27 1116)     Height: 4\' 11"  (149.9 cm) Weight: 73.9 kg BMI (Calculated): 32.9   INTAKE/OUTPUT:  This shift: No intake/output data recorded.  Last 2 shifts: @IOLAST2SHIFTS @   PHYSICAL EXAM:  Constitutional:  -- Normal body habitus  -- Awake, alert, and oriented x3  Eyes:  -- Pupils equally round and reactive to light  -- No scleral icterus  Ear, nose, and throat:  -- No jugular venous distension  Pulmonary:  -- No crackles  -- Equal breath sounds bilaterally -- Breathing non-labored at rest Cardiovascular:  -- S1, S2 present  -- No pericardial rubs Gastrointestinal:  -- Abdomen soft, tender to palpation in the right upper quadrant, non-distended, no guarding or rebound tenderness -- No abdominal masses appreciated, pulsatile or otherwise  Musculoskeletal and Integumentary:  -- Wounds: None appreciated -- Extremities: B/L UE and LE FROM, hands and feet warm, no edema  Neurologic:  -- Motor function: intact and symmetric -- Sensation: intact and symmetric   Labs:     Latest Ref Rng & Units 07/16/2022   11:23 AM 03/14/2020    4:45 AM 03/13/2020    5:10 AM  CBC  WBC 4.0 - 10.5 K/uL 21.1  9.6  10.3   Hemoglobin 12.0 - 15.0 g/dL 13.3  11.4  12.2   Hematocrit 36.0 - 46.0 % 39.7  34.1  37.1   Platelets 150 - 400 K/uL 300  246  286       Latest Ref Rng & Units 07/16/2022   11:23 AM 03/14/2020    4:45 AM 03/13/2020    5:10 AM  CMP  Glucose 70 - 99 mg/dL 287  205  177   BUN 8 - 23 mg/dL 20  21  21    Creatinine 0.44 - 1.00 mg/dL 1.50  1.55  1.60   Sodium  135 - 145 mmol/L 132  140  137   Potassium 3.5 - 5.1 mmol/L 4.6  3.9  4.1  Chloride 98 - 111 mmol/L 99  107  108   CO2 22 - 32 mmol/L 23  23  23    Calcium 8.9 - 10.3 mg/dL 9.7  8.8  8.9   Total Protein 6.5 - 8.1 g/dL 7.5   6.7   Total Bilirubin 0.3 - 1.2 mg/dL 0.9   0.7   Alkaline Phos 38 - 126 U/L 112   74   AST 15 - 41 U/L 22   68   ALT 0 - 44 U/L 17   22     Imaging studies:  EXAM: ABDOMEN ULTRASOUND COMPLETE   COMPARISON:  Renal ultrasound dated 01/28/2019   FINDINGS: Gallbladder: Cholelithiasis with large stone at the gallbladder neck. No gallbladder wall thickening. No sonographic Murphy sign noted by sonographer.   Common bile duct: Diameter: 2 mm   Liver: No focal lesion identified. Within normal limits in parenchymal echogenicity. Portal vein is patent on color Doppler imaging with normal direction of blood flow towards the liver.   IVC: No abnormality visualized.   Pancreas: Diffusely increased parenchymal echogenicity.   Spleen: Size and appearance within normal limits.   Right Kidney: Length: 11.1 cm. No urinary tract dilation or shadowing calculi. Renal cortical thinning.   Left Kidney: Length: 10.0 cm. No urinary tract dilation or shadowing calculi. Renal cortical thinning.   Abdominal aorta: No aneurysm visualized proximally. Not well seen distally due to overlying bowel gas.   Other findings: None.   IMPRESSION: 1. Cholelithiasis with large stone at the gallbladder neck. No sonographic finding of acute cholecystitis. 2. Partially imaged pancreas appears diffusely echogenic. While nonspecific, this finding can be associated with hepatic steatosis and prediabetes/diabetes. 3. Bilateral renal cortical thinning.  No hydronephrosis.     Electronically Signed   By: Darrin Nipper M.D.   On: 07/16/2022 13:22  Assessment/Plan:  82 y.o. female with acute cholecystitis, complicated by pertinent comorbidities including type 2 diabetes, coronary artery  disease, hypertension, chronic kidney disease.  Patient with history, physical exam and images consistent with acute cholecystitis. Patient oriented about diagnosis and surgical management as treatment.   Discussed the risk of surgery including post-op infxn, seroma, biloma, chronic pain, poor-delayed wound healing, retained gallstone, conversion to open procedure, post-op SBO or ileus, and need for additional procedures to address said risks.  The risks of general anesthetic including MI, CVA, sudden death or even reaction to anesthetic medications also discussed. Alternatives include continued observation.  Benefits include possible symptom relief, prevention of complications including acute cholecystitis, pancreatitis.  Patient seems to be medically stable.  I reviewed the last evaluation by cardiology on 06/13/2022.  As per the cardiologist she has been stable from her chronic CHF.  No adjustment of medication or any therapy.  I think that the benefits of the surgery are higher than the risk since patient had acute cholecystitis with leukocytosis and the gallstone stuck in the gallbladder neck.  I discussed these recommendation and the risks of surgery with the patient and her daughter and they agreed to proceed with cholecystectomy.  Arnold Long, MD

## 2022-07-16 NOTE — H&P (Signed)
History and Physical    Patient: Margaret Payne DOB: [birth year] DOA: 07/16/2022 DOS: the patient was seen and examined on 07/16/2022 PCP: Birdie Sons, MD  Patient coming from: Home  Chief Complaint:  Chief Complaint  Patient presents with   Abdominal Pain   HPI: Margaret Payne is a 82 y.o. female with medical history significant of type 2 diabetes, CKD stage IV, hypertension, hyperlipidemia, CAD s/p DES to mid LAD (2021), who presents to the ED due to right upper quadrant pain.  Ms. Hosea states that yesterday at approximately 6 PM, she had sudden onset severe right upper quadrant abdominal pain approximately 6 hours after her last meal.  The pain has been intermittent but has never completely resolved.  She has tried Tylenol, heat, and ambulation without significant success.  She denies any prior history of similar symptoms after eating.  She denies any other symptoms at this time including nausea, vomiting, diarrhea, fever, chills, shortness of breath, chest pain or palpitations.  She denies any chest pain or shortness of breath with exertion either.  ED course: On arrival to the ED, patient was hypertensive at 141/59 with heart rate of 114.  She was afebrile at 99.5.  She was saturating at 97% on room air.  Initial workup notable for WBC of 21.1, hemoglobin of 13.3, sodium of 132, glucose of 287, BUN 20, creatinine 1.50, GFR of 35. Right upper quadrant ultrasound was obtained that demonstrated a large stone at the gallbladder neck but no sonographic findings of acute cholecystitis.  General surgery consulted with recommendation to start on Zosyn.  TRH contacted for admission.  Review of Systems: As mentioned in the history of present illness. All other systems reviewed and are negative.  Past Medical History:  Diagnosis Date   Arthritis    CHF (congestive heart failure) (HCC)    Chronic kidney disease    Diabetes mellitus without complication ([place])    type 2    Headache    migrains   HOH (hard of hearing)    Hyperlipidemia    Hypertension    Non-ST elevation (NSTEMI) myocardial infarction [facility])    Past Surgical History:  Procedure Laterality Date   ABDOMINAL HYSTERECTOMY     BREAST BIOPSY     CARDIAC CATHETERIZATION     CATARACT EXTRACTION W/PHACO Right 10/03/2014   Procedure: CATARACT EXTRACTION PHACO AND INTRAOCULAR LENS PLACEMENT ([place]);  Surgeon: Birder Robson, MD;  Location: [facility] ORS;  Service: Ophthalmology;  Laterality: Right;  [place] 00:57 AP% 16.7 CDE 9.52 Fluid pack IN:[id number] H   COLONOSCOPY WITH PROPOFOL N/A 05/02/2021   Procedure: COLONOSCOPY WITH PROPOFOL;  Surgeon: Jonathon Bellows, MD;  Location: [facility] ENDOSCOPY;  Service: Gastroenterology;  Laterality: N/A;   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   Procedure: CORONARY STENT INTERVENTION;  Surgeon: Nelva Bush, MD;  Location: [place] [facility] LAB;  Service: Cardiovascular;  Laterality: N/A;   ESOPHAGOGASTRODUODENOSCOPY N/A 05/02/2021   Procedure: ESOPHAGOGASTRODUODENOSCOPY (EGD);  Surgeon: Jonathon Bellows, MD;  Location: [facility] ENDOSCOPY;  Service: Gastroenterology;  Laterality: N/A;   ESOPHAGOGASTRODUODENOSCOPY (EGD) WITH PROPOFOL N/A 07/16/2021   Procedure: ESOPHAGOGASTRODUODENOSCOPY (EGD) WITH PROPOFOL;  Surgeon: Jonathon Bellows, MD;  Location: [facility] ENDOSCOPY;  Service: Gastroenterology;  Laterality: N/A;   INJECTION KNEE     LEFT HEART CATH AND CORONARY ANGIOGRAPHY N/A 03/13/2020   Procedure: LEFT HEART CATH AND CORONARY ANGIOGRAPHY;  Surgeon: Nelva Bush, MD;  Location: [place] [facility] LAB;  Service: Cardiovascular;  Laterality: N/A;   Social History:  reports that she  has never smoked. She has never used smokeless tobacco. She reports that she does not drink alcohol and does not use drugs.  Allergies  Allergen Reactions   Codeine Sulfate Nausea Only    Family History  Problem Relation Age of Onset   Breast cancer Mother    Emphysema Father    Breast cancer Sister 65     Prior to Admission medications   Medication Sig Start Date End Date Taking? Authorizing Provider  acetaminophen (TYLENOL) 500 MG tablet Take 1,000 mg by mouth every 4 (four) hours as needed for mild pain.   Yes [provider]  alendronate (FOSAMAX) 70 MG tablet Take 1 tablet (70 mg total) by mouth once a week. Take with a full glass of water on an empty stomach. Patient taking differently: Take 70 mg by mouth every Wednesday. Take with a full glass of water on an empty stomach. 05/20/22  Yes Birdie Sons, MD  aspirin EC 81 MG EC tablet Take 1 tablet (81 mg total) by mouth daily. Swallow whole. 03/15/20  Yes Donne Hazel, MD  atorvastatin (LIPITOR) 80 MG tablet Take 1 tablet by mouth once daily Patient taking differently: Take 80 mg by mouth daily. 04/17/22  Yes End, Harrell Gave, MD  carvedilol (COREG) 3.125 MG tablet TAKE 1 TABLET BY MOUTH TWICE DAILY WITH MEALS Patient taking differently: Take 3.125 mg by mouth 2 (two) times daily with a meal. 12/09/21  Yes End, Harrell Gave, MD  Cholecalciferol (VITAMIN D3) 5000 units TABS Take 5,000 Units by mouth daily.   Yes [provider]  Dulaglutide 3 MG/0.5ML SOPN Inject 3 mg into the skin every Thursday.   Yes [provider]  ferrous sulfate 325 (65 FE) MG tablet Take 325 mg by mouth daily with breakfast. 06/16/21  Yes [provider]  insulin lispro protamine-lispro (HUMALOG 75/25 MIX) (75-25) 100 UNIT/ML SUSP injection Inject 25-40 Units into the skin See admin instructions. 40 units in AM and 25 units in PM   Yes [provider]  lisinopril (ZESTRIL) 10 MG tablet Take 1 tablet by mouth once daily 06/19/22  Yes End, Harrell Gave, MD  Multiple Vitamin (MULTI-VITAMINS) TABS Take 1 tablet by mouth daily.   Yes [provider]  omeprazole (PRILOSEC OTC) 20 MG tablet Take 40 mg by mouth daily.   Yes [provider]    Physical Exam: Vitals:   07/16/22 1530 07/16/22 1549 07/16/22 1630  07/16/22 1700  BP: (!) 125/54 (!) 125/54 (!) 115/54 (!) 119/56  Pulse: 96 98 98 99  Resp:  (!) 24 (!) 27 (!) 31  Temp:  99.4 F (37.4 C)    TempSrc:  Oral    SpO2: 96% 98% 100% 95%  Weight:      Height:       Physical Exam Vitals and nursing note reviewed.  Constitutional:      General: She is not in acute distress.    Appearance: She is normal weight. She is not toxic-appearing.  HENT:     Head: Normocephalic and atraumatic.  Cardiovascular:     Rate and Rhythm: Normal rate and regular rhythm.     Heart sounds: No murmur heard.    No gallop.  Pulmonary:     Effort: Pulmonary effort is normal. No respiratory distress.     Breath sounds: Normal breath sounds. No wheezing, rhonchi or rales.  Abdominal:     General: Bowel sounds are decreased. There is no distension.     Palpations: Abdomen is  soft.     Tenderness: There is abdominal tenderness (mild) in the right upper quadrant and epigastric area. There is no guarding or rebound. Negative signs include Murphy's sign.  Musculoskeletal:     Right lower leg: No edema.     Left lower leg: No edema.  Skin:    General: Skin is warm and dry.  Neurological:     General: No focal deficit present.     Mental Status: She is alert and oriented to person, place, and time. Mental status is at baseline.  Psychiatric:        Mood and Affect: Mood normal.        Behavior: Behavior normal.    Data Reviewed: CBC with WBC of 21.1, hemoglobin of 13.3, platelets of 300 CMP with sodium of 132, potassium 4.6, bicarb 23, glucose 287, BUN 20, creatinine 1.50, anion gap 10, alkaline phosphatase 112, AST 22, ALT 17, GFR of 35 Urinalysis with glucosuria only  US Abdomen Complete  Result Date: 07/16/2022 CLINICAL DATA:  One day history of right upper quadrant pain EXAM: ABDOMEN ULTRASOUND COMPLETE COMPARISON:  Renal ultrasound dated 01/28/2019 FINDINGS: Gallbladder: Cholelithiasis with large stone at the gallbladder neck. No gallbladder wall  thickening. No sonographic Murphy sign noted by sonographer. Common bile duct: Diameter: 2 mm Liver: No focal lesion identified. Within normal limits in parenchymal echogenicity. Portal vein is patent on color Doppler imaging with normal direction of blood flow towards the liver. IVC: No abnormality visualized. Pancreas: Diffusely increased parenchymal echogenicity. Spleen: Size and appearance within normal limits. Right Kidney: Length: 11.1 cm. No urinary tract dilation or shadowing calculi. Renal cortical thinning. Left Kidney: Length: 10.0 cm. No urinary tract dilation or shadowing calculi. Renal cortical thinning. Abdominal aorta: No aneurysm visualized proximally. Not well seen distally due to overlying bowel gas. Other findings: None. IMPRESSION: 1. Cholelithiasis with large stone at the gallbladder neck. No sonographic finding of acute cholecystitis. 2. Partially imaged pancreas appears diffusely echogenic. While nonspecific, this finding can be associated with hepatic steatosis and prediabetes/diabetes. 3. Bilateral renal cortical thinning.  No hydronephrosis. Electronically Signed   By: Darrin Nipper M.D.   On: 07/16/2022 13:22    There are no new results to review at this time.  Assessment and Plan:  * Acute cholecystitis Patient presenting with sudden onset, waxing and waning right upper quadrant pain with evidence of a large gallbladder calculus in the gallbladder neck. Significantly elevated WBC concerning for acute cholecystitis despite lack of negative sonographic findings  Revised cardiac risk index score of 4, class IV risk.  - General surgery consulted; appreciate their recommendations - N.p.o. - Given history of CKD, will switch from Zosyn and metronidazole  HFrEF (heart failure with reduced ejection fraction) (HCC) History of HFrEF with recovered EF in the setting of ischemic cardiomyopathy.  She appears euvolemic at this time.  Coronary artery disease Patient denies any chest  pain, pressure or shortness of breath with exertion or at rest.  She follows up yearly with cardiology.  - Continue home aspirin and statin  Stage 3b chronic kidney disease (CKD) ([place]) Renal function currently at baseline.  Will follow closely during admission.  - Repeat BMP in the a.m.  Essential hypertension - Continue home antihypertensives  Type 2 diabetes mellitus with diabetic nephropathy, with long-term current use of insulin (HCC) - Hold home antiglycemic agents - A1c pending - SSI, moderate - Semglee 10 units at bedtime  Advance Care Planning:   Code Status: Full Code verified by patient  Consults: General surgery  Family Communication: Patient's daughter updated at bedside.   Severity of Illness: The appropriate patient status for this patient is INPATIENT. Inpatient status is judged to be reasonable and necessary in order to provide the required intensity of service to ensure the patient's safety. The patient's presenting symptoms, physical exam findings, and initial radiographic and laboratory data in the context of their chronic comorbidities is felt to place them at high risk for further clinical deterioration. Furthermore, it is not anticipated that the patient will be medically stable for discharge from the hospital within 2 midnights of admission.   * I certify that at the point of admission it is my clinical judgment that the patient will require inpatient hospital care spanning beyond 2 midnights from the point of admission due to high intensity of service, high risk for further deterioration and high frequency of surveillance required.*  Author: Jose Persia, MD 07/16/2022 5:29 PM  For on call review [email or web address].

## 2022-07-17 ENCOUNTER — Other Ambulatory Visit: Payer: Self-pay

## 2022-07-17 ENCOUNTER — Inpatient Hospital Stay: Payer: PPO | Admitting: Certified Registered Nurse Anesthetist

## 2022-07-17 ENCOUNTER — Encounter: Payer: Self-pay | Admitting: Internal Medicine

## 2022-07-17 ENCOUNTER — Encounter
Admission: EM | Disposition: A | Discharge status: Home / Self Care | Payer: Self-pay | Source: Home / Self Care | Attending: Hospitalist

## 2022-07-17 DIAGNOSIS — K810 Acute cholecystitis: Secondary | ICD-10-CM | POA: Diagnosis not present

## 2022-07-17 LAB — COMPREHENSIVE METABOLIC PANEL
ALT: 14 IU/L (ref 0–32)
ALT: 13 U/L (ref 0–44)
AST: 14 IU/L (ref 0–40)
AST: 16 U/L (ref 15–41)
Albumin/Globulin Ratio: 1.5 (ref 1.2–2.2)
Albumin: 4.3 g/dL (ref 3.8–4.8)
Albumin: 3.2 g/dL — ABNORMAL LOW (ref 3.5–5.0)
Alkaline Phosphatase: 158 IU/L — ABNORMAL HIGH (ref 44–121)
Alkaline Phosphatase: 88 U/L (ref 38–126)
Anion gap: 11 (ref 5–15)
BUN/Creatinine Ratio: 11 — ABNORMAL LOW (ref 12–28)
BUN: 18 mg/dL (ref 8–27)
BUN: 22 mg/dL (ref 8–23)
Bilirubin Total: 0.7 mg/dL (ref 0.0–1.2)
CO2: 19 mmol/L — ABNORMAL LOW (ref 20–29)
CO2: 22 mmol/L (ref 22–32)
Calcium: 10.1 mg/dL (ref 8.7–10.3)
Calcium: 9 mg/dL (ref 8.9–10.3)
Chloride: 96 mmol/L (ref 96–106)
Chloride: 101 mmol/L (ref 98–111)
Creatinine, Ser: 1.57 mg/dL — ABNORMAL HIGH (ref 0.57–1.00)
Creatinine, Ser: 1.6 mg/dL — ABNORMAL HIGH (ref 0.44–1.00)
GFR, Estimated: 32 mL/min — ABNORMAL LOW (ref >60)
Globulin, Total: 2.8 g/dL (ref 1.5–4.5)
Glucose, Bld: 281 mg/dL — ABNORMAL HIGH (ref 70–99)
Glucose: 277 mg/dL — ABNORMAL HIGH (ref 70–99)
Potassium: 4.9 mmol/L (ref 3.5–5.2)
Potassium: 4.1 mmol/L (ref 3.5–5.1)
Sodium: 135 mmol/L (ref 134–144)
Sodium: 134 mmol/L — ABNORMAL LOW (ref 135–145)
Total Bilirubin: 0.8 mg/dL (ref 0.3–1.2)
Total Protein: 7.1 g/dL (ref 6.0–8.5)
Total Protein: 6.8 g/dL (ref 6.5–8.1)
eGFR: 33 mL/min/{1.73_m2} — ABNORMAL LOW (ref >59)

## 2022-07-17 LAB — CBC WITH DIFFERENTIAL/PLATELET
Abs Immature Granulocytes: 0.11 10*3/uL — ABNORMAL HIGH (ref 0.00–0.07)
Basophils Absolute: 0.1 10*3/uL (ref 0.0–0.1)
Basophils Relative: 0 %
Eosinophils Absolute: 0 10*3/uL (ref 0.0–0.5)
Eosinophils Relative: 0 %
HCT: 35.6 % — ABNORMAL LOW (ref 36.0–46.0)
Hemoglobin: 12 g/dL (ref 12.0–15.0)
Immature Granulocytes: 1 %
Lymphocytes Relative: 9 %
Lymphs Abs: 1.9 10*3/uL (ref 0.7–4.0)
MCH: 30.8 pg (ref 26.0–34.0)
MCHC: 33.7 g/dL (ref 30.0–36.0)
MCV: 91.5 fL (ref 80.0–100.0)
Monocytes Absolute: 1.8 10*3/uL — ABNORMAL HIGH (ref 0.1–1.0)
Monocytes Relative: 9 %
Neutro Abs: 16.3 10*3/uL — ABNORMAL HIGH (ref 1.7–7.7)
Neutrophils Relative %: 81 %
Platelets: 258 10*3/uL (ref 150–400)
RBC: 3.89 MIL/uL (ref 3.87–5.11)
RDW: 13.4 % (ref 11.5–15.5)
WBC: 20.1 10*3/uL — ABNORMAL HIGH (ref 4.0–10.5)
nRBC: 0 % (ref 0.0–0.2)

## 2022-07-17 LAB — CBC
Hematocrit: 38.6 % (ref 34.0–46.6)
Hemoglobin: 13 g/dL (ref 11.1–15.9)
MCH: 31.1 pg (ref 26.6–33.0)
MCHC: 33.7 g/dL (ref 31.5–35.7)
MCV: 92 fL (ref 79–97)
Platelets: 299 10*3/uL (ref 150–450)
RBC: 4.18 x10E6/uL (ref 3.77–5.28)
RDW: 12.4 % (ref 11.7–15.4)
WBC: 20.6 10*3/uL (ref 3.4–10.8)

## 2022-07-17 LAB — GLUCOSE, CAPILLARY
Glucose-Capillary: 273 mg/dL — ABNORMAL HIGH (ref 70–99)
Glucose-Capillary: 227 mg/dL — ABNORMAL HIGH (ref 70–99)
Glucose-Capillary: 275 mg/dL — ABNORMAL HIGH (ref 70–99)
Glucose-Capillary: 288 mg/dL — ABNORMAL HIGH (ref 70–99)
Glucose-Capillary: 339 mg/dL — ABNORMAL HIGH (ref 70–99)

## 2022-07-17 LAB — LIPASE: Lipase: 27 U/L (ref 14–85)

## 2022-07-17 LAB — GAMMA GT: GGT: 25 IU/L (ref 0–60)

## 2022-07-17 SURGERY — CHOLECYSTECTOMY, ROBOT-ASSISTED, LAPAROSCOPIC
Anesthesia: General

## 2022-07-17 MED ORDER — PROPOFOL 10 MG/ML IV BOLUS
INTRAVENOUS | Status: DC | PRN
  Administered 2022-07-17: 130 mg via INTRAVENOUS

## 2022-07-17 MED ORDER — OXYCODONE HCL 5 MG PO TABS: 5.0000 mg | ORAL_TABLET | Freq: Once | ORAL | Status: DC | PRN

## 2022-07-17 MED ORDER — DEXAMETHASONE SODIUM PHOSPHATE 10 MG/ML IJ SOLN
INTRAMUSCULAR | Status: AC
  Filled 2022-07-17: qty 1

## 2022-07-17 MED ORDER — HEPARIN SODIUM (PORCINE) 5000 UNIT/ML IJ SOLN
5000.0000 [IU] | Freq: Three times a day (TID) | INTRAMUSCULAR | Status: DC
  Administered 2022-07-17 – 2022-07-18 (×2): 5000 [IU] via SUBCUTANEOUS
  Filled 2022-07-17 (×2): qty 1

## 2022-07-17 MED ORDER — ONDANSETRON HCL 4 MG/2ML IJ SOLN
INTRAMUSCULAR | Status: DC | PRN
  Administered 2022-07-17: 4 mg via INTRAVENOUS

## 2022-07-17 MED ORDER — INDOCYANINE GREEN 25 MG IV SOLR
1.2500 mg | Freq: Once | INTRAVENOUS | Status: AC
  Administered 2022-07-17: 1.25 mg via INTRAVENOUS
  Filled 2022-07-17: qty 0.5

## 2022-07-17 MED ORDER — BUPIVACAINE-EPINEPHRINE 0.25% -1:200000 IJ SOLN
INTRAMUSCULAR | Status: DC | PRN
  Administered 2022-07-17: 20 mL

## 2022-07-17 MED ORDER — LACTATED RINGERS IV SOLN: INTRAVENOUS | Status: DC

## 2022-07-17 MED ORDER — INSULIN ASPART 100 UNIT/ML IJ SOLN
8.0000 [IU] | Freq: Once | INTRAMUSCULAR | Status: AC
  Administered 2022-07-17: 8 [IU] via SUBCUTANEOUS

## 2022-07-17 MED ORDER — ROCURONIUM BROMIDE 100 MG/10ML IV SOLN
INTRAVENOUS | Status: DC | PRN
  Administered 2022-07-17: 50 mg via INTRAVENOUS
  Administered 2022-07-17 (×2): 10 mg via INTRAVENOUS

## 2022-07-17 MED ORDER — SODIUM CHLORIDE 0.9 % IV SOLN: INTRAVENOUS | Status: DC

## 2022-07-17 MED ORDER — ACETAMINOPHEN 10 MG/ML IV SOLN
INTRAVENOUS | Status: AC
  Filled 2022-07-17: qty 100

## 2022-07-17 MED ORDER — FENTANYL CITRATE (PF) 100 MCG/2ML IJ SOLN
INTRAMUSCULAR | Status: AC
  Filled 2022-07-17: qty 2

## 2022-07-17 MED ORDER — OXYCODONE HCL 5 MG/5ML PO SOLN: 5.0000 mg | Freq: Once | ORAL | Status: DC | PRN

## 2022-07-17 MED ORDER — ACETAMINOPHEN 10 MG/ML IV SOLN
INTRAVENOUS | Status: DC | PRN
  Administered 2022-07-17: 1000 mg via INTRAVENOUS

## 2022-07-17 MED ORDER — ORAL CARE MOUTH RINSE: 15.0000 mL | Freq: Once | OROMUCOSAL | Status: DC

## 2022-07-17 MED ORDER — ACETAMINOPHEN 10 MG/ML IV SOLN: 1000.0000 mg | Freq: Once | INTRAVENOUS | Status: DC | PRN

## 2022-07-17 MED ORDER — PROPOFOL 10 MG/ML IV BOLUS
INTRAVENOUS | Status: AC
  Filled 2022-07-17: qty 20

## 2022-07-17 MED ORDER — ESMOLOL HCL 100 MG/10ML IV SOLN
INTRAVENOUS | Status: AC
  Filled 2022-07-17: qty 10

## 2022-07-17 MED ORDER — 0.9 % SODIUM CHLORIDE (POUR BTL) OPTIME
TOPICAL | Status: DC | PRN
  Administered 2022-07-17: 500 mL

## 2022-07-17 MED ORDER — BUPIVACAINE HCL (PF) 0.25 % IJ SOLN
INTRAMUSCULAR | Status: AC
  Filled 2022-07-17: qty 30

## 2022-07-17 MED ORDER — INSULIN ASPART PROT & ASPART (70-30 MIX) 100 UNIT/ML ~~LOC~~ SUSP
25.0000 [IU] | Freq: Every day | SUBCUTANEOUS | Status: DC
  Administered 2022-07-17: 25 [IU] via SUBCUTANEOUS
  Filled 2022-07-17: qty 10

## 2022-07-17 MED ORDER — DEXMEDETOMIDINE HCL IN NACL 200 MCG/50ML IV SOLN
INTRAVENOUS | Status: DC | PRN
  Administered 2022-07-17: 4 ug via INTRAVENOUS
  Administered 2022-07-17 (×2): 8 ug via INTRAVENOUS

## 2022-07-17 MED ORDER — INSULIN ASPART PROT & ASPART (70-30 MIX) 100 UNIT/ML ~~LOC~~ SUSP: 35.0000 [IU] | Freq: Every day | SUBCUTANEOUS | Status: DC

## 2022-07-17 MED ORDER — FENTANYL CITRATE (PF) 100 MCG/2ML IJ SOLN: 25.0000 ug | INTRAMUSCULAR | Status: DC | PRN

## 2022-07-17 MED ORDER — CHLORHEXIDINE GLUCONATE 0.12 % MT SOLN: 15.0000 mL | Freq: Once | OROMUCOSAL | Status: DC

## 2022-07-17 MED ORDER — MIDAZOLAM HCL 2 MG/2ML IJ SOLN
INTRAMUSCULAR | Status: AC
  Filled 2022-07-17: qty 2

## 2022-07-17 MED ORDER — INSULIN ASPART 100 UNIT/ML IJ SOLN
INTRAMUSCULAR | Status: AC
  Filled 2022-07-17: qty 1

## 2022-07-17 MED ORDER — ONDANSETRON HCL 4 MG/2ML IJ SOLN: 4.0000 mg | Freq: Once | INTRAMUSCULAR | Status: DC | PRN

## 2022-07-17 MED ORDER — FENTANYL CITRATE (PF) 100 MCG/2ML IJ SOLN
INTRAMUSCULAR | Status: DC | PRN
  Administered 2022-07-17: 50 ug via INTRAVENOUS
  Administered 2022-07-17 (×2): 25 ug via INTRAVENOUS
  Administered 2022-07-17 (×2): 50 ug via INTRAVENOUS

## 2022-07-17 MED ORDER — MORPHINE SULFATE (PF) 2 MG/ML IV SOLN: 2.0000 mg | INTRAVENOUS | Status: DC | PRN

## 2022-07-17 MED ORDER — DEXAMETHASONE SODIUM PHOSPHATE 10 MG/ML IJ SOLN
INTRAMUSCULAR | Status: DC | PRN
  Administered 2022-07-17: 4 mg via INTRAVENOUS

## 2022-07-17 MED ORDER — LIDOCAINE HCL (PF) 2 % IJ SOLN
INTRAMUSCULAR | Status: AC
  Filled 2022-07-17: qty 5

## 2022-07-17 MED ORDER — ESMOLOL HCL 100 MG/10ML IV SOLN
INTRAVENOUS | Status: DC | PRN
  Administered 2022-07-17 (×5): 20 mg via INTRAVENOUS

## 2022-07-17 MED ORDER — LIDOCAINE HCL (CARDIAC) PF 100 MG/5ML IV SOSY
PREFILLED_SYRINGE | INTRAVENOUS | Status: DC | PRN
  Administered 2022-07-17: 80 mg via INTRAVENOUS

## 2022-07-17 MED ORDER — EPINEPHRINE PF 1 MG/ML IJ SOLN
INTRAMUSCULAR | Status: AC
  Filled 2022-07-17: qty 1

## 2022-07-17 MED ORDER — PHENYLEPHRINE HCL (PRESSORS) 10 MG/ML IV SOLN
INTRAVENOUS | Status: DC | PRN
  Administered 2022-07-17: 80 ug via INTRAVENOUS
  Administered 2022-07-17 (×2): 160 ug via INTRAVENOUS

## 2022-07-17 MED ORDER — TRAMADOL HCL 50 MG PO TABS: 50.0000 mg | ORAL_TABLET | Freq: Four times a day (QID) | ORAL | Status: DC | PRN

## 2022-07-17 MED ORDER — SUGAMMADEX SODIUM 200 MG/2ML IV SOLN
INTRAVENOUS | Status: DC | PRN
  Administered 2022-07-17: 200 mg via INTRAVENOUS

## 2022-07-17 MED ORDER — SODIUM CHLORIDE 0.9 % IR SOLN
Status: DC | PRN
  Administered 2022-07-17: 1000 mL

## 2022-07-17 MED ORDER — ONDANSETRON HCL 4 MG/2ML IJ SOLN
INTRAMUSCULAR | Status: AC
  Filled 2022-07-17: qty 2

## 2022-07-17 MED ORDER — OXYCODONE-ACETAMINOPHEN 5-325 MG PO TABS: 1.0000 | ORAL_TABLET | ORAL | Status: DC | PRN

## 2022-07-17 SURGICAL SUPPLY — 57 items
BAG PRESSURE INF REUSE 1000 (BAG) IMPLANT
BLADE SURG SZ11 CARB STEEL (BLADE) ×1 IMPLANT
CANNULA REDUC XI 12-8 STAPL (CANNULA) ×1
CANNULA REDUCER 12-8 DVNC XI (CANNULA) ×1 IMPLANT
CATH REDDICK CHOLANGI 4FR 50CM (CATHETERS) IMPLANT
CLIP LIGATING HEM O LOK PURPLE (MISCELLANEOUS) IMPLANT
CLIP LIGATING HEMO O LOK GREEN (MISCELLANEOUS) ×1 IMPLANT
DERMABOND ADVANCED .7 DNX12 (GAUZE/BANDAGES/DRESSINGS) ×1 IMPLANT
DRAPE ARM DVNC X/XI (DISPOSABLE) ×4 IMPLANT
DRAPE C-ARM XRAY 36X54 (DRAPES) IMPLANT
DRAPE COLUMN DVNC XI (DISPOSABLE) ×1 IMPLANT
DRAPE DA VINCI XI ARM (DISPOSABLE) ×4
DRAPE DA VINCI XI COLUMN (DISPOSABLE) ×1
ELECT BLADE 4.0 EZ CLEAN MEGAD (MISCELLANEOUS) ×1
ELECT REM PT RETURN 9FT ADLT (ELECTROSURGICAL) ×1
ELECTRODE BLDE 4.0 EZ CLN MEGD (MISCELLANEOUS) IMPLANT
ELECTRODE REM PT RTRN 9FT ADLT (ELECTROSURGICAL) ×1 IMPLANT
GLOVE BIO SURGEON STRL SZ 6.5 (GLOVE) ×2 IMPLANT
GLOVE BIOGEL PI IND STRL 6.5 (GLOVE) ×2 IMPLANT
GOWN STRL REUS W/ TWL LRG LVL3 (GOWN DISPOSABLE) ×3 IMPLANT
GOWN STRL REUS W/TWL LRG LVL3 (GOWN DISPOSABLE) ×3
GRASPER SUT TROCAR 14GX15 (MISCELLANEOUS) ×1 IMPLANT
IRRIGATOR SUCT 8 DISP DVNC XI (IRRIGATION / IRRIGATOR) IMPLANT
IRRIGATOR SUCTION 8MM XI DISP (IRRIGATION / IRRIGATOR) ×1
IV CATH ANGIO 12GX3 LT BLUE (NEEDLE) IMPLANT
IV NS 1000ML (IV SOLUTION) ×1
IV NS 1000ML BAXH (IV SOLUTION) IMPLANT
KIT PINK PAD W/HEAD ARE REST (MISCELLANEOUS) ×1 IMPLANT
KIT PINK PAD W/HEAD ARM REST (MISCELLANEOUS) ×1 IMPLANT
LABEL OR SOLS (LABEL) ×1 IMPLANT
MANIFOLD NEPTUNE II (INSTRUMENTS) ×1 IMPLANT
NDL HYPO 22X1.5 SAFETY MO (MISCELLANEOUS) ×1 IMPLANT
NDL INSUFFLATION 14GA 120MM (NEEDLE) ×1 IMPLANT
NEEDLE HYPO 22X1.5 SAFETY MO (MISCELLANEOUS) ×1 IMPLANT
NEEDLE INSUFFLATION 14GA 120MM (NEEDLE) ×1 IMPLANT
NS IRRIG 500ML POUR BTL (IV SOLUTION) ×1 IMPLANT
OBTURATOR OPTICAL STANDARD 8MM (TROCAR) ×1
OBTURATOR OPTICAL STND 8 DVNC (TROCAR) ×1
OBTURATOR OPTICALSTD 8 DVNC (TROCAR) ×1 IMPLANT
PACK LAP CHOLECYSTECTOMY (MISCELLANEOUS) ×1 IMPLANT
PENCIL SMOKE EVACUATOR (MISCELLANEOUS) IMPLANT
SEAL UNIV 5-12 XI (MISCELLANEOUS) ×4 IMPLANT
SEAL XI UNIVERSAL 5-12 (MISCELLANEOUS) ×4
SET TUBE SMOKE EVAC HIGH FLOW (TUBING) ×1 IMPLANT
SOL ELECTROSURG ANTI STICK (MISCELLANEOUS) ×1
SOLUTION ELECTROSURG ANTI STCK (MISCELLANEOUS) ×1 IMPLANT
SPIKE FLUID TRANSFER (MISCELLANEOUS) ×2 IMPLANT
SPONGE T-LAP 4X18 ~~LOC~~+RFID (SPONGE) IMPLANT
SUT MNCRL 4-0 (SUTURE) ×1
SUT MNCRL 4-0 27XMFL (SUTURE) ×1
SUT VICRYL 0 AB UR-6 (SUTURE) IMPLANT
SUT VICRYL 0 UR6 27IN ABS (SUTURE) ×1 IMPLANT
SUTURE MNCRL 4-0 27XMF (SUTURE) ×1 IMPLANT
SYS BAG RETRIEVAL 10MM (BASKET) ×1
SYSTEM BAG RETRIEVAL 10MM (BASKET) ×1 IMPLANT
TRAP FLUID SMOKE EVACUATOR (MISCELLANEOUS) ×1 IMPLANT
WATER STERILE IRR 500ML POUR (IV SOLUTION) ×1 IMPLANT

## 2022-07-17 NOTE — Op Note (Signed)
Preoperative diagnosis: Acute cholecystitis  Postoperative diagnosis: Acute over chronic cholecystitis  Procedure: Robotic Assisted Laparoscopic Cholecystectomy.   Anesthesia: GETA   Surgeon: Dr. Windell Moment  Wound Classification: Clean Contaminated  Indications: Patient is a 82 y.o. female developed right upper quadrant pain, leukocytosis and on workup was found to have cholelithiasis with a normal common duct and cholecystitis. Robotic Assisted Laparoscopic cholecystectomy was elected.  Findings: Severely dilated and tense gallbladder Critical view of safety achieved Cystic duct and artery identified, ligated and divided Adequate hemostasis  Description of procedure: The patient was placed on the operating table in the supine position. General anesthesia was induced. A time-out was completed verifying correct patient, procedure, site, positioning, and implant(s) and/or special equipment prior to beginning this procedure. An orogastric tube was placed. The abdomen was prepped and draped in the usual sterile fashion.  An incision was made in a natural skin line below the umbilicus.  The fascia was elevated and the Veress needle inserted. Proper position was confirmed by aspiration and saline meniscus test.  The abdomen was insufflated with carbon dioxide to a pressure of 15 mmHg. The patient tolerated insufflation well. A 8-mm trocar was then inserted in optiview fashion.  The laparoscope was inserted and the abdomen inspected. No injuries from initial trocar placement were noted. Additional trocars were then inserted in the following locations: an 8-mm trocar in the left lateral abdomen, and another two 8-mm trocars to the right side of the abdomen 5 cm appart. The umbilical trocar was changed to a 12 mm trocar all under direct visualization. The abdomen was inspected and no abnormalities were found. The table was placed in the reverse Trendelenburg position with the right side up. The  robotic arms were docked and target anatomy identified. Instrument inserted under direct visualization.  Hard and difficult adhesions between the gallbladder and omentum, duodenum and transverse colon were lysed with electrocautery. This was very difficult and time consuming. The dome of the gallbladder was not able to be grasped with a prograsp so the gallbladder needed to be drained. Once drained, it was retracted over the dome of the liver. The infundibulum was also grasped with an atraumatic grasper and retracted toward the right lower quadrant. This maneuver exposed Calot's triangle. The peritoneum overlying the gallbladder infundibulum was then incised and the cystic duct and cystic artery identified and circumferentially dissected. Critical view of safety reviewed before ligating any structure. Firefly images taken to visualize biliary ducts. The cystic duct and cystic artery were then doubly clipped and divided close to the gallbladder.  The gallbladder was then dissected from its peritoneal attachments by electrocautery. Hemostasis was checked and the gallbladder and contained stones were removed using an endoscopic retrieval bag. The gallbladder was passed off the table as a specimen. The gallbladder fossa was copiously irrigated with saline and hemostasis was obtained. There was no evidence of bleeding from the gallbladder fossa or cystic artery or leakage of the bile from the cystic duct stump. Secondary trocars were removed under direct vision. No bleeding was noted. The robotic arms were undoked. The scope was withdrawn and the umbilical trocar removed. The abdomen was allowed to collapse. The fascia of the 40mm trocar sites was closed with figure-of-eight 0 vicryl sutures. The skin was closed with subcuticular sutures of 4-0 monocryl and topical skin adhesive. The orogastric tube was removed.  The patient tolerated the procedure well and was taken to the postanesthesia care unit in stable  condition.   Specimen: Gallbladder  Complications: None  EBL: 25 mL

## 2022-07-17 NOTE — Progress Notes (Addendum)
  PROGRESS NOTE    Margaret Payne  [id number] DOB: 1940/06/20 DOA: 07/16/2022 PCP: Birdie Sons, MD  [id number]/[id number]-AA  LOS: 1 day   Brief hospital course:   Assessment & Plan: Margaret Payne is a 82 y.o. 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    * Acute cholecystitis Patient presenting with sudden onset, waxing and waning right upper quadrant pain with evidence of a large gallbladder calculus in the gallbladder neck. Significantly elevated WBC concerning for acute cholecystitis despite lack of negative sonographic findings --cholecystectomy today --cont ceftriaxone and flagyl  HFrEF (heart failure with reduced ejection fraction) (HCC) History of HFrEF with recovered EF in the setting of ischemic cardiomyopathy.  She appears euvolemic at this time. --cont coreg and Lisinopril  Coronary artery disease Patient denies any chest pain, pressure or shortness of breath with exertion or at rest.  She follows up yearly with cardiology.  - Continue home aspirin and statin  Stage 3b chronic kidney disease (CKD) ([place]) Renal function currently at baseline.  Will follow closely during admission.  Essential hypertension --cont coreg and Lisinopril  Type 2 diabetes mellitus with diabetic nephropathy, with long-term current use of insulin (HCC) - SSI, moderate --70/30 35u q am and 25u q pm.  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 --Cr stable and at baseline  Obesity, BMI: 32.91    DVT prophylaxis: Heparin SQ Code Status: Full code  Family Communication:  Level of care: Med-Surg Dispo:   The patient is from: home Anticipated d/c is to: home Anticipated d/c date is: possible tomorrow   Subjective and Interval History:  Pt had cholecystectomy today.  Tolerated it well.  No pain complaints right afterwards.   Objective: Vitals:   07/17/22 1515 07/17/22  1530 07/17/22 1605 07/17/22 1651  BP: (!) 111/56 (!) 103/59 (!) 102/51 (!) 106/57  Pulse: 94 94 95 90  Resp: 18 15 16 18   Temp:  97.9 F (36.6 C) 98 F (36.7 C) 98.2 F (36.8 C)  TempSrc:  Oral    SpO2: 95% 94% 92% 91%  Weight:      Height:        Intake/Output Summary (Last 24 hours) at 07/17/2022 1855 Last data filed at 07/17/2022 1518 Gross per 24 hour  Intake 1300 ml  Output 20 ml  Net 1280 ml   Filed Weights   07/16/22 1116 07/17/22 1123  Weight: 73.9 kg 73.9 kg    Examination:   Constitutional: NAD, AAOx3 HEENT: conjunctivae and lids normal, EOMI CV: No cyanosis.   RESP: normal respiratory effort, on RA Neuro: II - XII grossly intact.     Data Reviewed: I have personally reviewed labs and imaging studies  Time spent: 50 minutes  Enzo Bi, MD [facility] If 7PM-7AM, please contact night-coverage 07/17/2022, 6:55 PM

## 2022-07-17 NOTE — Inpatient Diabetes Management (Signed)
Inpatient Diabetes Program Recommendations  AACE/ADA: New Consensus Statement on Inpatient Glycemic Control   Target Ranges:  Prepandial:   less than 140 mg/dL      Peak postprandial:   less than 180 mg/dL (1-2 hours)      Critically ill patients:  140 - 180 mg/dL    Latest Reference Range & Units 07/16/22 22:04 07/17/22 07:25  Glucose-Capillary 70 - 99 mg/dL 386 (H) 273 (H)   Review of Glycemic Control  Diabetes history: DM2 Outpatient Diabetes medications: Humalog 75/25 40 units QAM, Humalog 75/25 25 units QPM, Trulicity 3 mg Qweek (Thursday) Current orders for Inpatient glycemic control: Semglee 10 units QHS, Novolog 0-15 units TID with meals  Inpatient Diabetes Program Recommendations:    Insulin: Please consider increasing Semglee to 15 units daily and adding Novolog 0-5 units QHS for bedtime correction.  Thanks, Barnie Alderman, RN, MSN, [place] [facility] Coordinator Inpatient Diabetes Program [phone] (Team Pager from 8am to [place])

## 2022-07-17 NOTE — Anesthesia Preprocedure Evaluation (Addendum)
Anesthesia Evaluation  Patient identified by MRN, date of birth, ID band Patient awake    Reviewed: Allergy & Precautions, NPO status , Patient's Chart, lab work & pertinent test results  History of Anesthesia Complications Negative for: history of anesthetic complications  Airway Mallampati: IV   Neck ROM: Full    Dental  (+) Edentulous Upper, Edentulous Lower   Pulmonary neg pulmonary ROS   Pulmonary exam normal breath sounds clear to auscultation       Cardiovascular hypertension, + CAD (s/p MI and stents) and +CHF (ICM)  Normal cardiovascular exam Rhythm:Regular Rate:Normal  ECG 07/16/22: Sinus tachycardia (disagree with auto read; Dr. Fletcher Anon agrees this is sinus tachycardia)   Neuro/Psych  Headaches HOH, retinopathy    GI/Hepatic ,GERD  ,,  Endo/Other  diabetes, Type 2, Insulin Dependent    Renal/GU Renal disease (stage III CKD)     Musculoskeletal  (+) Arthritis ,    Abdominal   Peds  Hematology   Anesthesia Other Findings Last dose of Dulaglutide 07/10/22   Cardiology note 06/13/22:  ASSESSMENT AND PLAN: Coronary artery disease: Ms. Roehrig continues to do well without recurrent angina.  Continue aspirin, carvedilol, and atorvastatin for secondary prevention.   Chronic HFrEF with recovered ejection fraction: Ms. Borjas appears euvolemic today and notes only occasional dependent leg edema.  She does not exercise regularly but is able to walk through stores without any difficulty, consistent with NYHA class II symptoms.  We will continue current doses of carvedilol and lisinopril, as higher doses of GDMT have led to lightheadedness in the past.   Hypertension: Blood pressure well-controlled today.  No medication change at this time.   Hyperlipidemia associated with type 2 diabetes mellitus: Most recent lipid panel in 02/2022 notable for total cholesterol 122, triglycerides 160, HDL 41, and LDL 49.  Continue  atorvastatin 80 mg daily.   Chronic kidney disease stage 3b: Renal function stable.  Continue current dose of lisinopril and ongoing follow-up with nephrology.  Avoid nephrotoxic agents.   Follow-up: Return to clinic in 1 year.   Reproductive/Obstetrics                             Anesthesia Physical Anesthesia Plan  ASA: 3  Anesthesia Plan: General   Post-op Pain Management:    Induction: Intravenous  PONV Risk Score and Plan: 3 and Ondansetron, Dexamethasone and Treatment may vary due to age or medical condition  Airway Management Planned: Oral ETT  Additional Equipment:   Intra-op Plan:   Post-operative Plan: Extubation in OR  Informed Consent: I have reviewed the patients History and Physical, chart, labs and discussed the procedure including the risks, benefits and alternatives for the proposed anesthesia with the patient or authorized representative who has indicated his/her understanding and acceptance.     Dental advisory given  Plan Discussed with: CRNA  Anesthesia Plan Comments: (Patient consented for risks of anesthesia including but not limited to:  - adverse reactions to medications - damage to eyes, teeth, lips or other oral mucosa - nerve damage due to positioning  - sore throat or hoarseness - damage to heart, brain, nerves, lungs, other parts of body or loss of life  Informed patient about role of CRNA in peri- and intra-operative care.  Patient voiced understanding.)        Anesthesia Quick Evaluation

## 2022-07-17 NOTE — Interval H&P Note (Signed)
History and Physical Interval Note:  07/17/2022 11:44 AM  Margaret Payne  has presented today for surgery, with the diagnosis of ACUTE CHOLECYSTITIS.  The various methods of treatment have been discussed with the patient and family. After consideration of risks, benefits and other options for treatment, the patient has consented to  Procedure(s): XI ROBOTIC ASSISTED LAPAROSCOPIC CHOLECYSTECTOMY (N/A) [place] (ICG) (N/A) as a surgical intervention.  The patient's history has been reviewed, patient examined, no change in status, stable for surgery.  I have reviewed the patient's chart and labs.  Questions were answered to the patient's satisfaction.     Herbert Pun

## 2022-07-17 NOTE — Anesthesia Procedure Notes (Signed)
Procedure Name: Intubation Date/Time: 07/17/2022 11:56 AM  Performed by: Tollie Eth, CRNAPre-anesthesia Checklist: Patient identified, Patient being monitored, Timeout performed, Emergency Drugs available and Suction available Patient Re-evaluated:Patient Re-evaluated prior to induction Oxygen Delivery Method: Circle system utilized Preoxygenation: Pre-oxygenation with 100% oxygen Induction Type: IV induction Ventilation: Mask ventilation without difficulty Laryngoscope Size: 3 and McGraph Grade View: Grade I Tube type: Oral Tube size: 7.0 mm Number of attempts: 1 Airway Equipment and Method: Stylet and Video-laryngoscopy Placement Confirmation: ETT inserted through vocal cords under direct vision, positive ETCO2 and breath sounds checked- equal and bilateral Secured at: 18 cm Tube secured with: Tape Dental Injury: Teeth and Oropharynx as per pre-operative assessment

## 2022-07-17 NOTE — Transfer of Care (Signed)
Immediate Anesthesia Transfer of Care Note  Patient: Margaret Payne  Procedure(s) Performed: XI ROBOTIC ASSISTED LAPAROSCOPIC CHOLECYSTECTOMY INDOCYANINE GREEN FLUORESCENCE IMAGING (ICG)  Patient Location: PACU  Anesthesia Type:General  Level of Consciousness: awake, alert , and oriented  Airway & Oxygen Therapy: Patient Spontanous Breathing and Patient connected to face mask oxygen  Post-op Assessment: Report given to RN and Post -op Vital signs reviewed and stable  Post vital signs: Reviewed and stable  Last Vitals:  Vitals Value Taken Time  BP 143/58 07/17/22 1424  Temp 36.5 C 07/17/22 1424  Pulse 103 07/17/22 1425  Resp 23 07/17/22 1425  SpO2 100 % 07/17/22 1425  Vitals shown include unvalidated device data.  Last Pain:  Vitals:   07/17/22 1424  TempSrc:   PainSc: Asleep      Patients Stated Pain Goal: 0 ([id number])  Complications: No notable events documented.

## 2022-07-18 LAB — BASIC METABOLIC PANEL
Anion gap: 10 (ref 5–15)
BUN: 24 mg/dL — ABNORMAL HIGH (ref 8–23)
CO2: 23 mmol/L (ref 22–32)
Calcium: 8.2 mg/dL — ABNORMAL LOW (ref 8.9–10.3)
Chloride: 104 mmol/L (ref 98–111)
Creatinine, Ser: 1.56 mg/dL — ABNORMAL HIGH (ref 0.44–1.00)
GFR, Estimated: 33 mL/min — ABNORMAL LOW (ref >60)
Glucose, Bld: 214 mg/dL — ABNORMAL HIGH (ref 70–99)
Potassium: 4 mmol/L (ref 3.5–5.1)
Sodium: 137 mmol/L (ref 135–145)

## 2022-07-18 LAB — CBC
HCT: 33 % — ABNORMAL LOW (ref 36.0–46.0)
Hemoglobin: 10.9 g/dL — ABNORMAL LOW (ref 12.0–15.0)
MCH: 30.4 pg (ref 26.0–34.0)
MCHC: 33 g/dL (ref 30.0–36.0)
MCV: 92.2 fL (ref 80.0–100.0)
Platelets: 224 10*3/uL (ref 150–400)
RBC: 3.58 MIL/uL — ABNORMAL LOW (ref 3.87–5.11)
RDW: 13.1 % (ref 11.5–15.5)
WBC: 17.6 10*3/uL — ABNORMAL HIGH (ref 4.0–10.5)
nRBC: 0 % (ref 0.0–0.2)

## 2022-07-18 LAB — GLUCOSE, CAPILLARY: Glucose-Capillary: 147 mg/dL — ABNORMAL HIGH (ref 70–99)

## 2022-07-18 LAB — HEMOGLOBIN A1C
Hgb A1c MFr Bld: 8 % — ABNORMAL HIGH (ref 4.8–5.6)
Mean Plasma Glucose: 183 mg/dL

## 2022-07-18 LAB — MAGNESIUM: Magnesium: 1.8 mg/dL (ref 1.7–2.4)

## 2022-07-18 MED ORDER — TRAMADOL HCL 50 MG PO TABS: 50.0000 mg | ORAL_TABLET | Freq: Four times a day (QID) | ORAL | 0 refills | Status: DC | PRN

## 2022-07-18 MED ORDER — ONDANSETRON HCL 4 MG PO TABS: 4.0000 mg | ORAL_TABLET | Freq: Every day | ORAL | 1 refills | Status: DC | PRN

## 2022-07-18 MED ORDER — AMOXICILLIN-POT CLAVULANATE 500-125 MG PO TABS
1.0000 | ORAL_TABLET | Freq: Two times a day (BID) | ORAL | Status: DC
  Administered 2022-07-18: 1 via ORAL
  Filled 2022-07-18: qty 1

## 2022-07-18 MED ORDER — AMOXICILLIN-POT CLAVULANATE 875-125 MG PO TABS: 1.0000 | ORAL_TABLET | Freq: Two times a day (BID) | ORAL | 0 refills | Status: AC

## 2022-07-18 NOTE — Anesthesia Postprocedure Evaluation (Signed)
Anesthesia Post Note  Patient: Margaret Payne  Procedure(s) Performed: XI ROBOTIC ASSISTED LAPAROSCOPIC CHOLECYSTECTOMY INDOCYANINE GREEN FLUORESCENCE IMAGING (ICG)  Patient location during evaluation: PACU Anesthesia Type: General Level of consciousness: awake and alert, oriented and patient cooperative Pain management: pain level controlled Vital Signs Assessment: post-procedure vital signs reviewed and stable Respiratory status: spontaneous breathing, nonlabored ventilation and respiratory function stable Cardiovascular status: blood pressure returned to baseline and stable Postop Assessment: adequate PO intake Anesthetic complications: no   No notable events documented.   Last Vitals:  Vitals:   07/18/22 0418 07/18/22 0738  BP: (!) 115/59 111/60  Pulse: 78 74  Resp: 20 16  Temp: 36.8 C 36.9 C  SpO2: 95% 94%    Last Pain:  Vitals:   07/18/22 0900  TempSrc:   PainSc: 0-No pain                 Darrin Nipper

## 2022-07-18 NOTE — Progress Notes (Signed)
Patient ID: Margaret Payne, female   DOB: 1940-08-08, 82 y.o.   MRN: [id number]     [place] Day(s): 2.   Interval History: Patient seen and examined, no acute events or new complaints overnight. Patient reports feeling great.  Patient denies any significant pain.  She is tolerating diet.  Vital signs in last 24 hours: [min-max] current  Temp:  [97.7 F (36.5 C)-99.3 F (37.4 C)] 98.3 F (36.8 C) (03/29 0418) Pulse Rate:  [78-102] 78 (03/29 0418) Resp:  [15-20] 20 (03/29 0418) BP: (98-143)/(51-65) 115/59 (03/29 0418) SpO2:  [91 %-99 %] 95 % (03/29 0418) Weight:  [73.9 kg] 73.9 kg (03/28 1123)     Height: 4\' 11"  (149.9 cm) Weight: 73.9 kg BMI (Calculated): 32.89   Physical Exam:  Constitutional: alert, cooperative and no distress  Respiratory: breathing non-labored at rest  Cardiovascular: regular rate and sinus rhythm  Gastrointestinal: soft, non-tender, and non-distended.  The wounds are dry and clean  Labs:     Latest Ref Rng & Units 07/18/2022    4:30 AM 07/17/2022    7:26 AM 07/16/2022   11:23 AM  CBC  WBC 4.0 - 10.5 K/uL 17.6  20.1  21.1   Hemoglobin 12.0 - 15.0 g/dL 10.9  12.0  13.3   Hematocrit 36.0 - 46.0 % 33.0  35.6  39.7   Platelets 150 - 400 K/uL 224  258  300       Latest Ref Rng & Units 07/18/2022    4:30 AM 07/17/2022    7:26 AM 07/16/2022   11:23 AM  CMP  Glucose 70 - 99 mg/dL 214  281  287   BUN 8 - 23 mg/dL 24  22  20    Creatinine 0.44 - 1.00 mg/dL 1.56  1.60  1.50   Sodium 135 - 145 mmol/L 137  134  132   Potassium 3.5 - 5.1 mmol/L 4.0  4.1  4.6   Chloride 98 - 111 mmol/L 104  101  99   CO2 22 - 32 mmol/L 23  22  23    Calcium 8.9 - 10.3 mg/dL 8.2  9.0  9.7   Total Protein 6.5 - 8.1 g/dL  6.8  7.5   Total Bilirubin 0.3 - 1.2 mg/dL  0.8  0.9   Alkaline Phos 38 - 126 U/L  88  112   AST 15 - 41 U/L  16  22   ALT 0 - 44 U/L  13  17     Imaging studies: No new pertinent imaging studies   Assessment/Plan:  82 y.o. female with  acute over chronic cholecystitis 1 Day Post-Op s/p robotic assisted upper scopic cholecystectomy.  -Patient doing well postop -Pain controlled -Tolerating diet -No sign of complications.  Patient can be discharged from surgical standpoint.  I will follow her in 2 weeks in my office  Arnold Long, MD

## 2022-07-18 NOTE — Progress Notes (Signed)
Discharged. AVS printed and reviewed. Home with daughter. All belongings gathered.

## 2022-07-18 NOTE — Progress Notes (Signed)
PHARMACY NOTE:  ANTIMICROBIAL RENAL DOSAGE ADJUSTMENT  Current antimicrobial regimen includes a mismatch between antimicrobial dosage and estimated renal function.  As per policy approved by the Pharmacy & Therapeutics and Medical Executive Committees, the antimicrobial dosage will be adjusted accordingly.  Current antimicrobial dosage:  Augmentin 875 mg po BID  Indication: Acute cholecystitis   Renal Function:  Estimated Creatinine Clearance: 25.2 mL/min (A) (by C-G formula based on SCr of 1.56 mg/dL (H)).     Antimicrobial dosage has been changed to:  Augmentin 500 mg po BID   Thank you for allowing pharmacy to be a part of this patient's care.  Lorin Picket, [facility] 07/18/2022 8:47 AM

## 2022-07-18 NOTE — Care Management Important Message (Signed)
Important Message  Patient Details  Name: Margaret Payne MRN: [id number] Date of Birth: 1940/09/06   Medicare Important Message Given:  N/A - LOS <3 / Initial given by admissions     Dannette Barbara 07/18/2022, 8:31 AM

## 2022-07-18 NOTE — Discharge Summary (Signed)
Physician Discharge Summary   Margaret Payne  female DOB: [birth year]  [id number]  PCP: Birdie Sons, MD  Admit date: 07/16/2022 Discharge date: 07/18/2022  Admitted From: home Disposition:  home CODE STATUS: Full code   Hospital Course:  For full details, please see H&P, progress notes, consult notes and ancillary notes.  Briefly,  Margaret Payne is a 82 y.o. female with medical history significant of type 2 diabetes, CKD 3b, hypertension, CAD s/p DES to mid LAD (2021), who presented to the ED due to right upper quadrant pain.    * Acute cholecystitis S/p cholecystectomy on 07/17/22 Patient presenting with sudden onset, waxing and waning right upper quadrant pain with evidence of a large gallbladder calculus in the gallbladder neck. Significantly elevated WBC concerning for acute cholecystitis despite lack of negative sonographic findings.  Pt received cholecystectomy and tolerated it well. --pt received ceftriaxone and flagyl during hospitalization and was discharged on 5 more days of Agumentin. --outpatient f/u with Surgery Dr. Windell Moment in 2 weeks.   HFrEF (heart failure with reduced ejection fraction) (HCC) History of HFrEF with recovered EF in the setting of ischemic cardiomyopathy.  She appears euvolemic at this time. --cont coreg and Lisinopril   Hx of Coronary artery disease Patient denies any chest pain, pressure or shortness of breath with exertion or at rest.  She follows up yearly with cardiology. - Continue home aspirin and statin   Essential hypertension --cont coreg and Lisinopril   Type 2 diabetes mellitus with diabetic nephropathy, with long-term current use of insulin (HCC) --discharge back on home insulin regimen as below.   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 --Cr stable and at baseline   Obesity, BMI: 32.91    Discharge Diagnoses:  Principal Problem:   Acute cholecystitis Active Problems:   Type 2 diabetes mellitus with diabetic nephropathy, with  long-term current use of insulin (HCC)   Essential hypertension   Stage 3b chronic kidney disease (CKD) (HCC)   Coronary artery disease   HFrEF (heart failure with reduced ejection fraction) ([place])   30 Day Unplanned Readmission Risk Score    Flowsheet Row ED to Hosp-Admission (Current) from 07/16/2022 in [facility]  30 Day Unplanned Readmission Risk Score (%) 17.58 Filed at 07/18/2022 0801       This score is the patient's risk of an unplanned readmission within 30 days of being discharged (0 -100%). The score is based on dignosis, age, lab data, medications, orders, and past utilization.   Low:  0-14.9   Medium: 15-21.9   High: 22-29.9   Extreme: 30 and above         Discharge Instructions:  Allergies as of 07/18/2022       Reactions   Codeine Sulfate Nausea Only        Medication List     TAKE these medications    acetaminophen 500 MG tablet Commonly known as: TYLENOL Take 1,000 mg by mouth every 4 (four) hours as needed for mild pain.   alendronate 70 MG tablet Commonly known as: FOSAMAX Take 1 tablet (70 mg total) by mouth once a week. Take with a full glass of water on an empty stomach. What changed: when to take this   amoxicillin-clavulanate 875-125 MG tablet Commonly known as: AUGMENTIN Take 1 tablet by mouth every 12 (twelve) hours for 5 days.   aspirin EC 81 MG tablet Take 1 tablet (81 mg total) by mouth daily. Swallow whole.   atorvastatin 80 MG  tablet Commonly known as: LIPITOR Take 1 tablet by mouth once daily   carvedilol 3.125 MG tablet Commonly known as: COREG TAKE 1 TABLET BY MOUTH TWICE DAILY WITH MEALS   Dulaglutide 3 MG/0.5ML Sopn Inject 3 mg into the skin every Thursday.   ferrous sulfate 325 (65 FE) MG tablet Take 325 mg by mouth daily with breakfast.   insulin lispro protamine-lispro (75-25) 100 UNIT/ML Susp injection Commonly known as: HUMALOG 75/25 MIX Inject 25-40 Units into the skin See  admin instructions. 40 units in AM and 25 units in PM   lisinopril 10 MG tablet Commonly known as: ZESTRIL Take 1 tablet by mouth once daily   Multi-Vitamins Tabs Take 1 tablet by mouth daily.   omeprazole 20 MG tablet Commonly known as: PRILOSEC OTC Take 40 mg by mouth daily.   ondansetron 4 MG tablet Commonly known as: Zofran Take 1 tablet (4 mg total) by mouth daily as needed for nausea or vomiting.   traMADol 50 MG tablet Commonly known as: Ultram Take 1 tablet (50 mg total) by mouth every 6 (six) hours as needed.   Vitamin D3 125 MCG (5000 UT) Tabs Generic drug: Cholecalciferol Take 5,000 Units by mouth daily.         Follow-up Information     Herbert Pun, MD Follow up in 2 week(s).   Specialty: General Surgery Why: follow up after chlecystectomy Contact information: [place] [address] [phone]         Birdie Sons, MD Follow up in 1 week(s).   Specialty: Family Medicine Contact information: [address] [place] [address] [place]  [address] [phone]                 Allergies  Allergen Reactions   Codeine Sulfate Nausea Only     The results of significant diagnostics from this hospitalization (including imaging, microbiology, ancillary and laboratory) are listed below for reference.   Consultations:   Procedures/Studies: US Abdomen Complete  Result Date: 07/16/2022 CLINICAL DATA:  One day history of right upper quadrant pain EXAM: ABDOMEN ULTRASOUND COMPLETE COMPARISON:  Renal ultrasound dated 01/28/2019 FINDINGS: Gallbladder: Cholelithiasis with large stone at the gallbladder neck. No gallbladder wall thickening. No sonographic Murphy sign noted by sonographer. Common bile duct: Diameter: 2 mm Liver: No focal lesion identified. Within normal limits in parenchymal echogenicity. Portal vein is patent on color Doppler imaging with normal direction of blood flow towards the liver. IVC: No  abnormality visualized. Pancreas: Diffusely increased parenchymal echogenicity. Spleen: Size and appearance within normal limits. Right Kidney: Length: 11.1 cm. No urinary tract dilation or shadowing calculi. Renal cortical thinning. Left Kidney: Length: 10.0 cm. No urinary tract dilation or shadowing calculi. Renal cortical thinning. Abdominal aorta: No aneurysm visualized proximally. Not well seen distally due to overlying bowel gas. Other findings: None. IMPRESSION: 1. Cholelithiasis with large stone at the gallbladder neck. No sonographic finding of acute cholecystitis. 2. Partially imaged pancreas appears diffusely echogenic. While nonspecific, this finding can be associated with hepatic steatosis and prediabetes/diabetes. 3. Bilateral renal cortical thinning.  No hydronephrosis. Electronically Signed   By: Darrin Nipper M.D.   On: 07/16/2022 13:22      Labs: BNP (last 3 results) No results for input(s): "BNP" in the last 8760 hours. Basic Metabolic Panel: Recent Labs  Lab 07/16/22 1041 07/16/22 1123 07/17/22 0726 07/18/22 0430  NA 135 132* 134* 137  K 4.9 4.6 4.1 4.0  CL 96 99 101 104  CO2 19* 23  22 23  GLUCOSE 277* 287* 281* 214*  BUN 18 20 22  24*  CREATININE 1.57* 1.50* 1.60* 1.56*  CALCIUM 10.1 9.7 9.0 8.2*  MG  --   --   --  1.8   Liver Function Tests: Recent Labs  Lab 07/16/22 1041 07/16/22 1123 07/17/22 0726  AST 14 22 16   ALT 14 17 13   ALKPHOS 158* 112 88  BILITOT 0.7 0.9 0.8  PROT 7.1 7.5 6.8  ALBUMIN 4.3 3.8 3.2*   Recent Labs  Lab 07/16/22 1041 07/16/22 1123  LIPASE 27 31   No results for input(s): "AMMONIA" in the last 168 hours. CBC: Recent Labs  Lab 07/16/22 1041 07/16/22 1123 07/17/22 0726 07/18/22 0430  WBC 20.6* 21.1* 20.1* 17.6*  NEUTROABS  --   --  16.3*  --   HGB 13.0 13.3 12.0 10.9*  HCT 38.6 39.7 35.6* 33.0*  MCV 92 91.5 91.5 92.2  PLT 299 300 258 224   Cardiac Enzymes: No results for input(s): "CKTOTAL", "CKMB", "CKMBINDEX",  "TROPONINI" in the last 168 hours. BNP: Invalid input(s): "POCBNP" CBG: Recent Labs  Lab 07/17/22 1114 07/17/22 1448 07/17/22 1648 07/17/22 2114 07/18/22 0742  GLUCAP 227* 275* 288* 339* 147*   D-Dimer No results for input(s): "DDIMER" in the last 72 hours. Hgb A1c Recent Labs    07/16/22 1123  HGBA1C 8.0*   Lipid Profile No results for input(s): "CHOL", "HDL", "LDLCALC", "TRIG", "CHOLHDL", "LDLDIRECT" in the last 72 hours. Thyroid function studies No results for input(s): "TSH", "T4TOTAL", "T3FREE", "THYROIDAB" in the last 72 hours.  Invalid input(s): "FREET3" Anemia work up No results for input(s): "VITAMINB12", "FOLATE", "FERRITIN", "TIBC", "IRON", "RETICCTPCT" in the last 72 hours. Urinalysis    Component Value Date/Time   COLORURINE STRAW (A) 07/16/2022 1427   APPEARANCEUR CLEAR (A) 07/16/2022 1427   LABSPEC 1.010 07/16/2022 1427   PHURINE 6.0 07/16/2022 1427   GLUCOSEU 150 (A) 07/16/2022 1427   HGBUR NEGATIVE 07/16/2022 1427   BILIRUBINUR NEGATIVE 07/16/2022 1427   KETONESUR NEGATIVE 07/16/2022 1427   PROTEINUR NEGATIVE 07/16/2022 1427   NITRITE NEGATIVE 07/16/2022 1427   LEUKOCYTESUR NEGATIVE 07/16/2022 1427   Sepsis Labs Recent Labs  Lab 07/16/22 1041 07/16/22 1123 07/17/22 0726 07/18/22 0430  WBC 20.6* 21.1* 20.1* 17.6*   Microbiology No results found for this or any previous visit (from the past 240 hour(s)).   Total time spend on discharging this patient, including the last patient exam, discussing the hospital stay, instructions for ongoing care as it relates to all pertinent caregivers, as well as preparing the medical discharge records, prescriptions, and/or referrals as applicable, is 30 minutes.    Enzo Bi, MD  [facility] 07/18/2022, 8:47 AM

## 2022-07-18 NOTE — TOC Transition Note (Signed)
Transition of Care [facility]) - CM/SW Discharge Note   Patient Details  Name: Margaret Payne MRN: [id number] Date of Birth: 1940-06-03  Transition of Care [facility]) CM/SW Contact:  Ross Ludwig, LCSW Phone Number: 07/18/2022, 10:02 AM   Clinical Narrative:     Transition of Care [facility]) Screening Note   Patient Details  Name: Margaret Payne Date of Birth: 05-27-1940   Transition of Care [facility]) CM/SW Contact:    Ross Ludwig, LCSW Phone Number: 07/18/2022, 10:02 AM    Transition of Care Department [facility]) has reviewed patient and no TOC needs have been identified at this time. We will continue to monitor patient advancement through interdisciplinary progression rounds. If new patient transition needs arise, please place a TOC consult.           Patient Goals and CMS Choice      Discharge Placement                         Discharge Plan and Services Additional resources added to the After Visit Summary for                                       Social Determinants of Health (SDOH) Interventions SDOH Screenings   Food Insecurity: No Food Insecurity (07/16/2022)  Housing: Low Risk  (07/16/2022)  Transportation Needs: No Transportation Needs (07/16/2022)  Utilities: Not At Risk (07/16/2022)  Alcohol Screen: Low Risk  (07/16/2022)  Depression (PHQ2-9): Medium Risk (07/16/2022)  Financial Resource Strain: Low Risk  (11/29/2019)  Physical Activity: Inactive (11/29/2019)  Social Connections: Moderately Isolated (11/29/2019)  Stress: No Stress Concern Present (11/29/2019)  Tobacco Use: Low Risk  (07/17/2022)     Readmission Risk Interventions     No data to display

## 2022-07-18 NOTE — Discharge Instructions (Signed)

## 2022-07-21 ENCOUNTER — Telehealth: Payer: Self-pay | Admitting: *Deleted

## 2022-07-21 LAB — SURGICAL PATHOLOGY

## 2022-07-21 NOTE — Transitions of Care (Post Inpatient/ED Visit) (Signed)
   07/21/2022  Name: Margaret Payne MRN: [id number] DOB: 1940-10-29  Today's TOC FU Call Status: Today's TOC FU Call Status:: Successful TOC FU Call Competed TOC FU Call Complete Date: 07/21/22  Transition Care Management Follow-up Telephone Call Date of Discharge: 07/18/22 Discharge Facility: [facility]) Type of Discharge: Inpatient Admission Primary Inpatient Discharge Diagnosis:: Acute cholecystitis How have you been since you were released from the hospital?: Better Any questions or concerns?: No  Items Reviewed: Did you receive and understand the discharge instructions provided?: Yes Medications obtained and verified?: Yes (Medications Reviewed) Any new allergies since your discharge?: No Dietary orders reviewed?: No Do you have support at home?: Yes People in Home: alone Name of Support/Comfort Primary Source: [name] daughter and grand daughter  Home Care and Equipment/Supplies: [place] Ordered?: No Any new equipment or medical supplies ordered?: No  Functional Questionnaire: Do you need assistance with bathing/showering or dressing?: No Do you need assistance with meal preparation?: No Do you need assistance with eating?: No Do you have difficulty maintaining continence: No Do you need assistance with getting out of bed/getting out of a chair/moving?: No Do you have difficulty managing or taking your medications?: No  Follow up appointments reviewed: PCP Follow-up appointment confirmed?: Yes Date of PCP follow-up appointment?: 07/30/22 Follow-up Provider: DR Caryn Section 9:00 [facility] Follow-up appointment confirmed?: No Reason Specialist Follow-Up Not Confirmed: Patient has Specialist Provider Number and will Call for Appointment Do you need transportation to your follow-up appointment?: No Do you understand care options if your condition(s) worsen?: Yes-patient verbalized understanding  SDOH Interventions Today     Flowsheet Row Most Recent Value  SDOH Interventions   Food Insecurity Interventions Intervention Not Indicated  Housing Interventions Intervention Not Indicated  Transportation Interventions Intervention Not Indicated      Interventions Today    Flowsheet Row Most Recent Value  General Interventions   General Interventions Discussed/Reviewed General Interventions Discussed, General Interventions Reviewed, Doctor Visits  Doctor Visits Discussed/Reviewed Specialist, Doctor Visits Reviewed, Doctor Visits Discussed  PCP/Specialist Visits Compliance with follow-up visit  Nutrition Interventions   Nutrition Discussed/Reviewed Nutrition Discussed       [facility] [phone]

## 2022-07-23 ENCOUNTER — Telehealth: Payer: Self-pay

## 2022-07-23 NOTE — Telephone Encounter (Signed)
Copied from [place] [phone]. Topic: General - Other >> Jul 23, 2022  2:49 PM Santiya F wrote: Reason for CRM: Pt is calling in to cancel her appointment on 07/30/22. Pt says she has an appointment with her surgeon on 07/31/22 and doesn't know if the appointment is necessary. Please advise.

## 2022-07-24 NOTE — Telephone Encounter (Signed)
That's fine

## 2022-07-30 ENCOUNTER — Inpatient Hospital Stay: Payer: HMO | Admitting: Family Medicine

## 2022-08-05 ENCOUNTER — Other Ambulatory Visit: Payer: Self-pay | Admitting: Internal Medicine

## 2022-08-05 DIAGNOSIS — E785 Hyperlipidemia, unspecified: Secondary | ICD-10-CM

## 2022-08-14 ENCOUNTER — Ambulatory Visit: Payer: PPO | Admitting: Internal Medicine

## 2022-09-11 DIAGNOSIS — H43813 Vitreous degeneration, bilateral: Secondary | ICD-10-CM | POA: Diagnosis not present

## 2022-09-11 DIAGNOSIS — H3589 Other specified retinal disorders: Secondary | ICD-10-CM | POA: Diagnosis not present

## 2022-09-11 DIAGNOSIS — E119 Type 2 diabetes mellitus without complications: Secondary | ICD-10-CM | POA: Diagnosis not present

## 2022-09-11 DIAGNOSIS — H2512 Age-related nuclear cataract, left eye: Secondary | ICD-10-CM | POA: Diagnosis not present

## 2022-09-11 LAB — HM DIABETES EYE EXAM

## 2022-09-17 DIAGNOSIS — N1832 Chronic kidney disease, stage 3b: Secondary | ICD-10-CM | POA: Diagnosis not present

## 2022-09-17 DIAGNOSIS — M810 Age-related osteoporosis without current pathological fracture: Secondary | ICD-10-CM | POA: Diagnosis not present

## 2022-09-17 DIAGNOSIS — I10 Essential (primary) hypertension: Secondary | ICD-10-CM | POA: Diagnosis not present

## 2022-09-17 DIAGNOSIS — E1122 Type 2 diabetes mellitus with diabetic chronic kidney disease: Secondary | ICD-10-CM | POA: Diagnosis not present

## 2022-09-18 ENCOUNTER — Encounter: Payer: Self-pay | Admitting: Family Medicine

## 2022-09-22 DIAGNOSIS — M810 Age-related osteoporosis without current pathological fracture: Secondary | ICD-10-CM | POA: Diagnosis not present

## 2022-09-22 DIAGNOSIS — N1832 Chronic kidney disease, stage 3b: Secondary | ICD-10-CM | POA: Diagnosis not present

## 2022-09-22 DIAGNOSIS — I10 Essential (primary) hypertension: Secondary | ICD-10-CM | POA: Diagnosis not present

## 2022-09-22 DIAGNOSIS — E1122 Type 2 diabetes mellitus with diabetic chronic kidney disease: Secondary | ICD-10-CM | POA: Diagnosis not present

## 2022-09-22 DIAGNOSIS — D631 Anemia in chronic kidney disease: Secondary | ICD-10-CM | POA: Diagnosis not present

## 2022-09-24 DIAGNOSIS — H2512 Age-related nuclear cataract, left eye: Secondary | ICD-10-CM | POA: Diagnosis not present

## 2022-09-26 DIAGNOSIS — M1712 Unilateral primary osteoarthritis, left knee: Secondary | ICD-10-CM | POA: Diagnosis not present

## 2022-09-29 ENCOUNTER — Other Ambulatory Visit: Payer: Self-pay | Admitting: Internal Medicine

## 2022-09-29 DIAGNOSIS — I25118 Atherosclerotic heart disease of native coronary artery with other forms of angina pectoris: Secondary | ICD-10-CM

## 2022-10-01 ENCOUNTER — Encounter: Payer: Self-pay | Admitting: Ophthalmology

## 2022-10-01 NOTE — Anesthesia Preprocedure Evaluation (Addendum)
Anesthesia Evaluation  Patient identified by MRN, date of birth, ID band Patient awake    Reviewed: Allergy & Precautions, H&P , NPO status , Patient's Chart, lab work & pertinent test results  Airway Mallampati: I  TM Distance: >3 FB Neck ROM: Full    Dental no notable dental hx. (+) Edentulous Upper, Edentulous Lower   Pulmonary neg pulmonary ROS   Pulmonary exam normal breath sounds clear to auscultation       Cardiovascular hypertension, + angina  + CAD, + Past MI and +CHF  Normal cardiovascular exam Rhythm:Regular Rate:Normal  Echo 06-15-20 1. Left ventricular ejection fraction, by estimation, is 60 to 65%. The  left ventricle has normal function. The left ventricle has no regional  wall motion abnormalities. The average left ventricular global  longitudinal strain is -20.2 %. The global  longitudinal strain is normal.   2. Right ventricular systolic function is normal. The right ventricular  size is normal.   3. The mitral valve is normal in structure. No evidence of mitral valve  regurgitation.   4. The aortic valve was not well visualized.   5. The inferior vena cava is normal in size with greater than 50%  respiratory variability, suggesting right atrial pressure of 3 mmHg.     Neuro/Psych  Headaches Hard of hearing, has hearing aid  negative psych ROS   GI/Hepatic negative GI ROS, Neg liver ROS,,,  Endo/Other  diabetes, Type 2, Insulin Dependent    Renal/GU Renal disease  negative genitourinary   Musculoskeletal negative musculoskeletal ROS (+) Arthritis ,    Abdominal   Peds negative pediatric ROS (+)  Hematology negative hematology ROS (+) Blood dyscrasia, anemia   Anesthesia Other Findings Headache Diabetes mellitus   Hyperlipidemia Hypertension  Arthritis CHF (congestive heart failure)  Chronic kidney disease Non-ST elevation (NSTEMI)  myocardial infarction  Wears dentures Wears hearing  aid in both ears, HOH    Reproductive/Obstetrics negative OB ROS                             Anesthesia Physical Anesthesia Plan  ASA: 3  Anesthesia Plan: MAC   Post-op Pain Management:    Induction: Intravenous  PONV Risk Score and Plan:   Airway Management Planned: Natural Airway and Nasal Cannula  Additional Equipment:   Intra-op Plan:   Post-operative Plan:   Informed Consent: I have reviewed the patients History and Physical, chart, labs and discussed the procedure including the risks, benefits and alternatives for the proposed anesthesia with the patient or authorized representative who has indicated his/her understanding and acceptance.     Dental Advisory Given  Plan Discussed with: Anesthesiologist, CRNA and Surgeon  Anesthesia Plan Comments: (Patient consented for risks of anesthesia including but not limited to:  - adverse reactions to medications - damage to eyes, teeth, lips or other oral mucosa - nerve damage due to positioning  - sore throat or hoarseness - Damage to heart, brain, nerves, lungs, other parts of body or loss of life  Patient voiced understanding.)        Anesthesia Quick Evaluation

## 2022-10-02 NOTE — Discharge Instructions (Signed)

## 2022-10-07 ENCOUNTER — Encounter: Payer: Self-pay | Admitting: Ophthalmology

## 2022-10-07 ENCOUNTER — Other Ambulatory Visit: Payer: Self-pay

## 2022-10-07 ENCOUNTER — Ambulatory Visit: Payer: PPO | Admitting: Anesthesiology

## 2022-10-07 ENCOUNTER — Ambulatory Visit
Admission: RE | Admit: 2022-10-07 | Discharge: 2022-10-07 | Disposition: A | Discharge status: Home / Self Care | Payer: PPO | Attending: Ophthalmology | Admitting: Ophthalmology

## 2022-10-07 ENCOUNTER — Encounter
Admission: RE | Disposition: A | Discharge status: Home / Self Care | Payer: Self-pay | Source: Home / Self Care | Attending: Ophthalmology

## 2022-10-07 DIAGNOSIS — Z955 Presence of coronary angioplasty implant and graft: Secondary | ICD-10-CM | POA: Insufficient documentation

## 2022-10-07 DIAGNOSIS — I25118 Atherosclerotic heart disease of native coronary artery with other forms of angina pectoris: Secondary | ICD-10-CM | POA: Diagnosis not present

## 2022-10-07 DIAGNOSIS — E785 Hyperlipidemia, unspecified: Secondary | ICD-10-CM | POA: Diagnosis not present

## 2022-10-07 DIAGNOSIS — Z794 Long term (current) use of insulin: Secondary | ICD-10-CM | POA: Diagnosis not present

## 2022-10-07 DIAGNOSIS — I130 Hypertensive heart and chronic kidney disease with heart failure and stage 1 through stage 4 chronic kidney disease, or unspecified chronic kidney disease: Secondary | ICD-10-CM | POA: Diagnosis not present

## 2022-10-07 DIAGNOSIS — I5020 Unspecified systolic (congestive) heart failure: Secondary | ICD-10-CM | POA: Diagnosis not present

## 2022-10-07 DIAGNOSIS — E1122 Type 2 diabetes mellitus with diabetic chronic kidney disease: Secondary | ICD-10-CM | POA: Diagnosis not present

## 2022-10-07 DIAGNOSIS — N1832 Chronic kidney disease, stage 3b: Secondary | ICD-10-CM | POA: Diagnosis not present

## 2022-10-07 DIAGNOSIS — N189 Chronic kidney disease, unspecified: Secondary | ICD-10-CM | POA: Diagnosis not present

## 2022-10-07 DIAGNOSIS — Z79899 Other long term (current) drug therapy: Secondary | ICD-10-CM | POA: Diagnosis not present

## 2022-10-07 DIAGNOSIS — E1136 Type 2 diabetes mellitus with diabetic cataract: Secondary | ICD-10-CM | POA: Insufficient documentation

## 2022-10-07 DIAGNOSIS — I252 Old myocardial infarction: Secondary | ICD-10-CM | POA: Diagnosis not present

## 2022-10-07 DIAGNOSIS — H2512 Age-related nuclear cataract, left eye: Secondary | ICD-10-CM | POA: Diagnosis not present

## 2022-10-07 DIAGNOSIS — D631 Anemia in chronic kidney disease: Secondary | ICD-10-CM | POA: Diagnosis not present

## 2022-10-07 HISTORY — PX: CATARACT EXTRACTION W/PHACO: SHX586

## 2022-10-07 HISTORY — DX: Presence of external hearing-aid: Z97.4

## 2022-10-07 HISTORY — DX: Presence of dental prosthetic device (complete) (partial): Z97.2

## 2022-10-07 LAB — GLUCOSE, CAPILLARY: Glucose-Capillary: 106 mg/dL — ABNORMAL HIGH (ref 70–99)

## 2022-10-07 SURGERY — PHACOEMULSIFICATION, CATARACT, WITH IOL INSERTION
Anesthesia: Monitor Anesthesia Care | Site: Eye | Laterality: Left

## 2022-10-07 MED ORDER — BRIMONIDINE TARTRATE-TIMOLOL 0.2-0.5 % OP SOLN
OPHTHALMIC | Status: DC | PRN
  Administered 2022-10-07: 1 [drp] via OPHTHALMIC

## 2022-10-07 MED ORDER — SIGHTPATH DOSE#1 BSS IO SOLN
INTRAOCULAR | Status: DC | PRN
  Administered 2022-10-07: 64 mL via OPHTHALMIC

## 2022-10-07 MED ORDER — FENTANYL CITRATE (PF) 100 MCG/2ML IJ SOLN
INTRAMUSCULAR | Status: DC | PRN
  Administered 2022-10-07 (×2): 50 ug via INTRAVENOUS

## 2022-10-07 MED ORDER — MOXIFLOXACIN HCL 0.5 % OP SOLN
OPHTHALMIC | Status: DC | PRN
  Administered 2022-10-07: .2 mL via OPHTHALMIC

## 2022-10-07 MED ORDER — ARMC OPHTHALMIC DILATING DROPS
1.0000 | OPHTHALMIC | Status: DC | PRN
  Administered 2022-10-07 (×3): 1 via OPHTHALMIC

## 2022-10-07 MED ORDER — SIGHTPATH DOSE#1 BSS IO SOLN
INTRAOCULAR | Status: DC | PRN
  Administered 2022-10-07: 15 mL

## 2022-10-07 MED ORDER — TETRACAINE HCL 0.5 % OP SOLN
1.0000 [drp] | OPHTHALMIC | Status: DC | PRN
  Administered 2022-10-07 (×3): 1 [drp] via OPHTHALMIC

## 2022-10-07 MED ORDER — LACTATED RINGERS IV SOLN: INTRAVENOUS | Status: DC

## 2022-10-07 MED ORDER — SIGHTPATH DOSE#1 BSS IO SOLN
INTRAOCULAR | Status: DC | PRN
  Administered 2022-10-07: 1 mL via INTRAMUSCULAR

## 2022-10-07 MED ORDER — SIGHTPATH DOSE#1 NA CHONDROIT SULF-NA HYALURON 40-17 MG/ML IO SOLN
INTRAOCULAR | Status: DC | PRN
  Administered 2022-10-07: 1 mL via INTRAOCULAR

## 2022-10-07 SURGICAL SUPPLY — 12 items
ANGLE REVERSE CUT SHRT 25GA (CUTTER) ×1
CATARACT SUITE SIGHTPATH (MISCELLANEOUS) ×1 IMPLANT
CYSTOTOME ANGL RVRS SHRT 25G (CUTTER) ×1 IMPLANT
CYSTOTOME ANGL RVRS SHRT 25GA (CUTTER) ×1 IMPLANT
FEE CATARACT SUITE SIGHTPATH (MISCELLANEOUS) ×1 IMPLANT
GLOVE BIOGEL PI IND STRL 8 (GLOVE) ×1 IMPLANT
GLOVE SURG ENC TEXT LTX SZ8 (GLOVE) ×1 IMPLANT
LENS IOL TECNIS EYHANCE 23.0 (Intraocular Lens) IMPLANT
NDL FILTER BLUNT 18X1 1/2 (NEEDLE) ×1 IMPLANT
NEEDLE FILTER BLUNT 18X1 1/2 (NEEDLE) ×1 IMPLANT
RING MALYGIN (MISCELLANEOUS) IMPLANT
SYR 3ML LL SCALE MARK (SYRINGE) ×1 IMPLANT

## 2022-10-07 NOTE — Anesthesia Postprocedure Evaluation (Signed)
Anesthesia Post Note  Patient: Margaret Payne  Procedure(s) Performed: CATARACT EXTRACTION PHACO AND INTRAOCULAR LENS PLACEMENT (IOC) LEFT DIABETIC  9.51  00:58.9 (Left: Eye)  Patient location during evaluation: PACU Anesthesia Type: MAC Level of consciousness: awake and alert Pain management: pain level controlled Vital Signs Assessment: post-procedure vital signs reviewed and stable Respiratory status: spontaneous breathing, nonlabored ventilation, respiratory function stable and patient connected to nasal cannula oxygen Cardiovascular status: stable and blood pressure returned to baseline Postop Assessment: no apparent nausea or vomiting Anesthetic complications: no   No notable events documented.   Last Vitals:  Vitals:   10/07/22 0800 10/07/22 0801  BP: 128/66 128/66  Pulse: 71 72  Resp: (!) 22 20  Temp:    SpO2: 99% 97%    Last Pain:  Vitals:   10/07/22 0801  TempSrc:   PainSc: 0-No pain                 Alle Difabio C Kaedence Connelly

## 2022-10-07 NOTE — Transfer of Care (Signed)
Immediate Anesthesia Transfer of Care Note  Patient: Margaret Payne  Procedure(s) Performed: CATARACT EXTRACTION PHACO AND INTRAOCULAR LENS PLACEMENT (IOC) LEFT DIABETIC  9.51  00:58.9 (Left: Eye)  Patient Location: PACU  Anesthesia Type: MAC  Level of Consciousness: awake, alert  and patient cooperative  Airway and Oxygen Therapy: Patient Spontanous Breathing and Patient connected to supplemental oxygen  Post-op Assessment: Post-op Vital signs reviewed, Patient's Cardiovascular Status Stable, Respiratory Function Stable, Patent Airway and No signs of Nausea or vomiting  Post-op Vital Signs: Reviewed and stable  Complications: No notable events documented.

## 2022-10-07 NOTE — Op Note (Signed)
[facility]   Primary Care Physician:  Malva Limes, MD Ophthalmologist: Dr. Delsa Grana  Pre-Procedure History & Physical: HPI:  Margaret Payne is a 82 y.o. female here for cataract surgery.   Past Medical History:  Diagnosis Date   Arthritis    CHF (congestive heart failure) (HCC)    Chronic kidney disease    Diabetes mellitus without complication (HCC)    type 2   Headache    migraines.  None for 8-9 years   HOH (hard of hearing)    Hyperlipidemia    Hypertension    Non-ST elevation (NSTEMI) myocardial infarction (HCC) 02/2020   Wears dentures    Full upper.  no teeth on bottom   Wears hearing aid in both ears     Past Surgical History:  Procedure Laterality Date   ABDOMINAL HYSTERECTOMY  1986   BREAST BIOPSY     CARDIAC CATHETERIZATION     CATARACT EXTRACTION W/PHACO Right 10/03/2014   Procedure: CATARACT EXTRACTION PHACO AND INTRAOCULAR LENS PLACEMENT (IOC);  Surgeon: Galen Manila, MD;  Location: [facility] ORS;  Service: Ophthalmology;  Laterality: Right;  [place] 00:57 AP% 16.7 CDE 9.52 Fluid pack ZOX#[id number]   COLONOSCOPY WITH PROPOFOL N/A 05/02/2021   Procedure: COLONOSCOPY WITH PROPOFOL;  Surgeon: Wyline Mood, MD;  Location: [facility] ENDOSCOPY;  Service: Gastroenterology;  Laterality: N/A;   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   Procedure: CORONARY STENT INTERVENTION;  Surgeon: Yvonne Kendall, MD;  Location: [facility] INVASIVE CV LAB;  Service: Cardiovascular;  Laterality: N/A;   ESOPHAGOGASTRODUODENOSCOPY N/A 05/02/2021   Procedure: ESOPHAGOGASTRODUODENOSCOPY (EGD);  Surgeon: Wyline Mood, MD;  Location: [facility] ENDOSCOPY;  Service: Gastroenterology;  Laterality: N/A;   ESOPHAGOGASTRODUODENOSCOPY (EGD) WITH PROPOFOL N/A 07/16/2021   Procedure: ESOPHAGOGASTRODUODENOSCOPY (EGD) WITH PROPOFOL;  Surgeon: Wyline Mood, MD;  Location: [facility] ENDOSCOPY;  Service: Gastroenterology;  Laterality: N/A;   INJECTION KNEE     LEFT HEART CATH AND CORONARY ANGIOGRAPHY N/A  03/13/2020   Procedure: LEFT HEART CATH AND CORONARY ANGIOGRAPHY;  Surgeon: Yvonne Kendall, MD;  Location: [facility] INVASIVE CV LAB;  Service: Cardiovascular;  Laterality: N/A;    Prior to Admission medications   Medication Sig Start Date End Date Taking? Authorizing Provider  acetaminophen (TYLENOL) 500 MG tablet Take 1,000 mg by mouth every 4 (four) hours as needed for mild pain.   Yes [provider]  alendronate (FOSAMAX) 70 MG tablet Take 1 tablet (70 mg total) by mouth once a week. Take with a full glass of water on an empty stomach. Patient taking differently: Take 70 mg by mouth every Wednesday. Take with a full glass of water on an empty stomach. 05/20/22  Yes Malva Limes, MD  aspirin EC 81 MG EC tablet Take 1 tablet (81 mg total) by mouth daily. Swallow whole. 03/15/20  Yes Jerald Kief, MD  atorvastatin (LIPITOR) 80 MG tablet Take 1 tablet by mouth once daily 08/05/22  Yes End, Cristal Deer, MD  carvedilol (COREG) 3.125 MG tablet TAKE 1 TABLET BY MOUTH TWICE DAILY WITH MEALS 09/29/22  Yes End, Cristal Deer, MD  Cholecalciferol (VITAMIN D3) 5000 units TABS Take 5,000 Units by mouth daily.   Yes [provider]  Dulaglutide 3 MG/0.5ML SOPN Inject 3 mg into the skin every Thursday.   Yes [provider]  ferrous sulfate 325 (65 FE) MG tablet Take 325 mg by mouth daily with breakfast. 06/16/21  Yes [provider]  insulin lispro protamine-lispro (HUMALOG 75/25 MIX) (75-25) 100 UNIT/ML SUSP injection Inject 25-40 Units into  the skin See admin instructions. 40 units in AM and 25 units in PM   Yes [provider]  lisinopril (ZESTRIL) 10 MG tablet Take 1 tablet by mouth once daily 06/19/22  Yes End, Cristal Deer, MD  Multiple Vitamin (MULTI-VITAMINS) TABS Take 1 tablet by mouth daily.   Yes [provider]  omeprazole (PRILOSEC OTC) 20 MG tablet Take 40 mg by mouth daily.   Yes [provider]  ondansetron (ZOFRAN) 4 MG tablet Take  1 tablet (4 mg total) by mouth daily as needed for nausea or vomiting. Patient not taking: Reported on 10/01/2022 07/18/22 07/18/23  Carolan Shiver, MD  traMADol (ULTRAM) 50 MG tablet Take 1 tablet (50 mg total) by mouth every 6 (six) hours as needed. Patient not taking: Reported on 10/01/2022 07/18/22 07/18/23  Carolan Shiver, MD    Allergies as of 09/16/2022 - Review Complete 07/17/2022  Allergen Reaction Noted   Codeine sulfate Nausea Only 09/27/2014    Family History  Problem Relation Age of Onset   Breast cancer Mother    Emphysema Father    Breast cancer Sister 46    Social History   Socioeconomic History   Marital status: Divorced    Spouse name: Not on file   Number of children: 1   Years of education: Not on file   Highest education level: Associate degree: occupational, Scientist, product/process development, or vocational program  Occupational History   Occupation: Retired  Tobacco Use   Smoking status: Never   Smokeless tobacco: Never  Vaping Use   Vaping Use: Never used  Substance and Sexual Activity   Alcohol use: No    Alcohol/week: 0.0 standard drinks of alcohol   Drug use: No   Sexual activity: Not on file  Other Topics Concern   Not on file  Social History Narrative   Not on file   Social Determinants of Health   Financial Resource Strain: Low Risk  (11/29/2019)   Overall Financial Resource Strain (CARDIA)    Difficulty of Paying Living Expenses: Not hard at all  Food Insecurity: No Food Insecurity (07/21/2022)   Hunger Vital Sign    Worried About Running Out of Food in the Last Year: Never true    Ran Out of Food in the Last Year: Never true  Transportation Needs: No Transportation Needs (07/21/2022)   PRAPARE - Administrator, Civil Service (Medical): No    Lack of Transportation (Non-Medical): No  Physical Activity: Inactive (11/29/2019)   Exercise Vital Sign    Days of Exercise per Week: 0 days    Minutes of Exercise per Session: 0 min  Stress: No  Stress Concern Present (11/29/2019)   [employer] of Occupational Health - Occupational Stress Questionnaire    Feeling of Stress : Not at all  Social Connections: Moderately Isolated (11/29/2019)   Social Connection and Isolation Panel [NHANES]    Frequency of Communication with Friends and Family: More than three times a week    Frequency of Social Gatherings with Friends and Family: More than three times a week    Attends Religious Services: Never    Database administrator or Organizations: Yes    Attends Engineer, structural: More than 4 times per year    Marital Status: Divorced  Intimate Partner Violence: Not At Risk (07/16/2022)   Humiliation, Afraid, Rape, and Kick questionnaire    Fear of Current or Ex-Partner: No    Emotionally Abused: No    Physically Abused: No  Sexually Abused: No    Review of Systems: See HPI, otherwise negative ROS  Physical Exam: BP 111/65   Pulse 75   Resp 19   Ht 5' (1.524 m)   Wt 72.8 kg   SpO2 98%   BMI 31.33 kg/m  General:   Alert, cooperative in NAD Head:  Normocephalic and atraumatic. Respiratory:  Normal work of breathing. Cardiovascular:  RRR  Impression/Plan: Margaret Payne is here for cataract surgery.  Risks, benefits, limitations, and alternatives regarding cataract surgery have been reviewed with the patient.  Questions have been answered.  All parties agreeable.   Galen Manila, MD  10/07/2022, 7:55 AM

## 2022-10-07 NOTE — H&P (Signed)
[facility]   Primary Care Physician:  Malva Limes, MD Ophthalmologist: Dr. Druscilla Brownie  Pre-Procedure History & Physical: HPI:  Margaret Payne is a 82 y.o. female here for cataract surgery.   Past Medical History:  Diagnosis Date   Arthritis    CHF (congestive heart failure) (HCC)    Chronic kidney disease    Diabetes mellitus without complication (HCC)    type 2   Headache    migraines.  None for 8-9 years   HOH (hard of hearing)    Hyperlipidemia    Hypertension    Non-ST elevation (NSTEMI) myocardial infarction (HCC) 02/2020   Wears dentures    Full upper.  no teeth on bottom   Wears hearing aid in both ears     Past Surgical History:  Procedure Laterality Date   ABDOMINAL HYSTERECTOMY  1986   BREAST BIOPSY     CARDIAC CATHETERIZATION     CATARACT EXTRACTION W/PHACO Right 10/03/2014   Procedure: CATARACT EXTRACTION PHACO AND INTRAOCULAR LENS PLACEMENT (IOC);  Surgeon: Galen Manila, MD;  Location: [facility] ORS;  Service: Ophthalmology;  Laterality: Right;  [place] 00:57 AP% 16.7 CDE 9.52 Fluid pack [id number]   COLONOSCOPY WITH PROPOFOL N/A 05/02/2021   Procedure: COLONOSCOPY WITH PROPOFOL;  Surgeon: Wyline Mood, MD;  Location: [facility] ENDOSCOPY;  Service: Gastroenterology;  Laterality: N/A;   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   Procedure: CORONARY STENT INTERVENTION;  Surgeon: Yvonne Kendall, MD;  Location: [facility] INVASIVE CV LAB;  Service: Cardiovascular;  Laterality: N/A;   ESOPHAGOGASTRODUODENOSCOPY N/A 05/02/2021   Procedure: ESOPHAGOGASTRODUODENOSCOPY (EGD);  Surgeon: Wyline Mood, MD;  Location: [facility] ENDOSCOPY;  Service: Gastroenterology;  Laterality: N/A;   ESOPHAGOGASTRODUODENOSCOPY (EGD) WITH PROPOFOL N/A 07/16/2021   Procedure: ESOPHAGOGASTRODUODENOSCOPY (EGD) WITH PROPOFOL;  Surgeon: Wyline Mood, MD;  Location: [facility] ENDOSCOPY;  Service: Gastroenterology;  Laterality: N/A;   INJECTION KNEE     LEFT HEART CATH AND CORONARY ANGIOGRAPHY N/A  03/13/2020   Procedure: LEFT HEART CATH AND CORONARY ANGIOGRAPHY;  Surgeon: Yvonne Kendall, MD;  Location: [facility] INVASIVE CV LAB;  Service: Cardiovascular;  Laterality: N/A;    Prior to Admission medications   Medication Sig Start Date End Date Taking? Authorizing Provider  acetaminophen (TYLENOL) 500 MG tablet Take 1,000 mg by mouth every 4 (four) hours as needed for mild pain.   Yes [provider]  alendronate (FOSAMAX) 70 MG tablet Take 1 tablet (70 mg total) by mouth once a week. Take with a full glass of water on an empty stomach. Patient taking differently: Take 70 mg by mouth every Wednesday. Take with a full glass of water on an empty stomach. 05/20/22  Yes Malva Limes, MD  aspirin EC 81 MG EC tablet Take 1 tablet (81 mg total) by mouth daily. Swallow whole. 03/15/20  Yes Jerald Kief, MD  atorvastatin (LIPITOR) 80 MG tablet Take 1 tablet by mouth once daily 08/05/22  Yes End, Cristal Deer, MD  carvedilol (COREG) 3.125 MG tablet TAKE 1 TABLET BY MOUTH TWICE DAILY WITH MEALS 09/29/22  Yes End, Cristal Deer, MD  Cholecalciferol (VITAMIN D3) 5000 units TABS Take 5,000 Units by mouth daily.   Yes [provider]  Dulaglutide 3 MG/0.5ML SOPN Inject 3 mg into the skin every Thursday.   Yes [provider]  ferrous sulfate 325 (65 FE) MG tablet Take 325 mg by mouth daily with breakfast. 06/16/21  Yes [provider]  insulin lispro protamine-lispro (HUMALOG 75/25 MIX) (75-25) 100 UNIT/ML SUSP injection Inject 25-40 Units into  the skin See admin instructions. 40 units in AM and 25 units in PM   Yes [provider]  lisinopril (ZESTRIL) 10 MG tablet Take 1 tablet by mouth once daily 06/19/22  Yes End, Cristal Deer, MD  Multiple Vitamin (MULTI-VITAMINS) TABS Take 1 tablet by mouth daily.   Yes [provider]  omeprazole (PRILOSEC OTC) 20 MG tablet Take 40 mg by mouth daily.   Yes [provider]  ondansetron (ZOFRAN) 4 MG tablet Take  1 tablet (4 mg total) by mouth daily as needed for nausea or vomiting. Patient not taking: Reported on 10/01/2022 07/18/22 07/18/23  Carolan Shiver, MD  traMADol (ULTRAM) 50 MG tablet Take 1 tablet (50 mg total) by mouth every 6 (six) hours as needed. Patient not taking: Reported on 10/01/2022 07/18/22 07/18/23  Carolan Shiver, MD    Allergies as of 09/16/2022 - Review Complete 07/17/2022  Allergen Reaction Noted   Codeine sulfate Nausea Only 09/27/2014    Family History  Problem Relation Age of Onset   Breast cancer Mother    Emphysema Father    Breast cancer Sister 30    Social History   Socioeconomic History   Marital status: Divorced    Spouse name: Not on file   Number of children: 1   Years of education: Not on file   Highest education level: Associate degree: occupational, Scientist, product/process development, or vocational program  Occupational History   Occupation: Retired  Tobacco Use   Smoking status: Never   Smokeless tobacco: Never  Vaping Use   Vaping Use: Never used  Substance and Sexual Activity   Alcohol use: No    Alcohol/week: 0.0 standard drinks of alcohol   Drug use: No   Sexual activity: Not on file  Other Topics Concern   Not on file  Social History Narrative   Not on file   Social Determinants of Health   Financial Resource Strain: Low Risk  (11/29/2019)   Overall Financial Resource Strain (CARDIA)    Difficulty of Paying Living Expenses: Not hard at all  Food Insecurity: No Food Insecurity (07/21/2022)   Hunger Vital Sign    Worried About Running Out of Food in the Last Year: Never true    Ran Out of Food in the Last Year: Never true  Transportation Needs: No Transportation Needs (07/21/2022)   PRAPARE - Administrator, Civil Service (Medical): No    Lack of Transportation (Non-Medical): No  Physical Activity: Inactive (11/29/2019)   Exercise Vital Sign    Days of Exercise per Week: 0 days    Minutes of Exercise per Session: 0 min  Stress: No  Stress Concern Present (11/29/2019)   [employer] of Occupational Health - Occupational Stress Questionnaire    Feeling of Stress : Not at all  Social Connections: Moderately Isolated (11/29/2019)   Social Connection and Isolation Panel [NHANES]    Frequency of Communication with Friends and Family: More than three times a week    Frequency of Social Gatherings with Friends and Family: More than three times a week    Attends Religious Services: Never    Database administrator or Organizations: Yes    Attends Engineer, structural: More than 4 times per year    Marital Status: Divorced  Intimate Partner Violence: Not At Risk (07/16/2022)   Humiliation, Afraid, Rape, and Kick questionnaire    Fear of Current or Ex-Partner: No    Emotionally Abused: No    Physically Abused: No  Sexually Abused: No    Review of Systems: See HPI, otherwise negative ROS  Physical Exam: BP 111/65   Pulse 75   Resp 19   Ht 5' (1.524 m)   Wt 72.8 kg   SpO2 98%   BMI 31.33 kg/m  General:   Alert, cooperative in NAD Head:  Normocephalic and atraumatic. Respiratory:  Normal work of breathing. Cardiovascular:  RRR  Impression/Plan: Margaret Payne is here for cataract surgery.  Risks, benefits, limitations, and alternatives regarding cataract surgery have been reviewed with the patient.  Questions have been answered.  All parties agreeable.   Galen Manila, MD  10/07/2022, 7:17 AM

## 2022-10-08 ENCOUNTER — Encounter: Payer: Self-pay | Admitting: Ophthalmology

## 2022-10-08 NOTE — Op Note (Signed)
PREOPERATIVE DIAGNOSIS:  Nuclear sclerotic cataract of the right eye.   POSTOPERATIVE DIAGNOSIS:  H25.12 Cataract   OPERATIVE PROCEDURE:ORPROCALL@   SURGEON:  Galen Manila, MD.   ANESTHESIA:  Anesthesiologist: Marisue Humble, MD CRNA: Barbette Hair, CRNA  1.      Managed anesthesia care. 2.      0.58ml of Shugarcaine was instilled in the eye following the paracentesis.   COMPLICATIONS: Viscoelastic was used to raise the pupil margin.  A  Malyugin ring was placed as the pupil would not achieve sufficient pharmacologic dilation to undergo cataract extraction safely.( The ring was removed atraumatically following insertion of the IOL.)    TECHNIQUE:   Stop and chop   DESCRIPTION OF PROCEDURE:  The patient was examined and consented in the preoperative holding area where the aforementioned topical anesthesia was applied to the right eye and then brought back to the Operating Room where the right eye was prepped and draped in the usual sterile ophthalmic fashion and a lid speculum was placed. A paracentesis was created with the side port blade and the anterior chamber was filled with viscoelastic. A near clear corneal incision was performed with the steel keratome. A continuous curvilinear capsulorrhexis was performed with a cystotome followed by the capsulorrhexis forceps. Hydrodissection and hydrodelineation were carried out with BSS on a blunt cannula. The lens was removed in a stop and chop  technique and the remaining cortical material was removed with the irrigation-aspiration handpiece. The capsular bag was inflated with viscoelastic and the Technis [id number]  lens was placed in the capsular bag without complication. The remaining viscoelastic was removed from the eye with the irrigation-aspiration handpiece. The wounds were hydrated. The anterior chamber was flushed with BSS and the eye was inflated to physiologic pressure. 0.53ml of Vigamox was placed in the anterior chamber. The wounds were  found to be water tight. The eye was dressed with Combigan. The patient was given protective glasses to wear throughout the day and a shield with which to sleep tonight. The patient was also given drops with which to begin a drop regimen today and will follow-up with me in one day. Implant Name Type Inv. Item Serial No. Manufacturer Lot No. LRB No. Used Action  LENS IOL TECNIS EYHANCE 23.0 - [id number] Intraocular Lens LENS IOL TECNIS EYHANCE 23.0 [id number] [employer]  Left 1 Implanted   Procedure(s) with comments: CATARACT EXTRACTION PHACO AND INTRAOCULAR LENS PLACEMENT (IOC) LEFT DIABETIC  9.51  00:58.9 (Left) - Diabetic  Electronically signed: Galen Manila 10/08/2022 8:06 AM

## 2022-10-28 DIAGNOSIS — E1122 Type 2 diabetes mellitus with diabetic chronic kidney disease: Secondary | ICD-10-CM | POA: Diagnosis not present

## 2022-10-28 DIAGNOSIS — N184 Chronic kidney disease, stage 4 (severe): Secondary | ICD-10-CM | POA: Diagnosis not present

## 2022-10-28 DIAGNOSIS — Z794 Long term (current) use of insulin: Secondary | ICD-10-CM | POA: Diagnosis not present

## 2022-11-03 DIAGNOSIS — Z961 Presence of intraocular lens: Secondary | ICD-10-CM | POA: Diagnosis not present

## 2022-11-04 DIAGNOSIS — E11319 Type 2 diabetes mellitus with unspecified diabetic retinopathy without macular edema: Secondary | ICD-10-CM | POA: Diagnosis not present

## 2022-11-04 DIAGNOSIS — N1832 Chronic kidney disease, stage 3b: Secondary | ICD-10-CM | POA: Diagnosis not present

## 2022-11-04 DIAGNOSIS — E1169 Type 2 diabetes mellitus with other specified complication: Secondary | ICD-10-CM | POA: Diagnosis not present

## 2022-11-04 DIAGNOSIS — E1159 Type 2 diabetes mellitus with other circulatory complications: Secondary | ICD-10-CM | POA: Diagnosis not present

## 2022-11-04 DIAGNOSIS — E785 Hyperlipidemia, unspecified: Secondary | ICD-10-CM | POA: Diagnosis not present

## 2022-11-04 DIAGNOSIS — Z794 Long term (current) use of insulin: Secondary | ICD-10-CM | POA: Diagnosis not present

## 2022-11-04 DIAGNOSIS — E1122 Type 2 diabetes mellitus with diabetic chronic kidney disease: Secondary | ICD-10-CM | POA: Diagnosis not present

## 2023-01-05 ENCOUNTER — Ambulatory Visit (INDEPENDENT_AMBULATORY_CARE_PROVIDER_SITE_OTHER): Payer: HMO

## 2023-01-05 VITALS — Ht 60.0 in | Wt 158.8 lb

## 2023-01-05 DIAGNOSIS — Z0000 Encounter for general adult medical examination without abnormal findings: Secondary | ICD-10-CM | POA: Diagnosis not present

## 2023-01-05 NOTE — Progress Notes (Signed)
Subjective:   Margaret Payne is a 82 y.o. female who presents for Medicare Annual (Subsequent) preventive examination.  Visit Complete: Virtual  I connected with  Pearletha Furl on 01/05/23 by a audio enabled telemedicine application and verified that I am speaking with the correct person using two identifiers.  Patient Location: Home  Provider Location: Office/Clinic  I discussed the limitations of evaluation and management by telemedicine. The patient expressed understanding and agreed to proceed.  Vital Signs: Unable to obtain new vitals due to this being a telehealth visit.  Patient Medicare AWV questionnaire was completed by the patient on (not done); I have confirmed that all information answered by patient is correct and no changes since this date. Cardiac Risk Factors include: advanced age (>11men, >64 women);diabetes mellitus;dyslipidemia;hypertension;obesity (BMI >30kg/m2)    Objective:    Today's Vitals   01/05/23 1119  Weight: 158 lb 12.8 oz (72 kg)  Height: 5' (1.524 m)   Body mass index is 31.01 kg/m.     01/05/2023   11:29 AM 10/07/2022    6:42 AM 07/17/2022   11:15 AM 07/16/2022    7:50 PM 07/16/2022   11:19 AM 07/16/2021    8:49 AM 05/02/2021    8:04 AM  Advanced Directives  Does Patient Have a Medical Advance Directive? Yes Yes Yes Yes Yes Yes Yes  Type of Estate agent of [place];Living will Healthcare Power of [place];Living will Healthcare Power of [place];Living will Living will;Healthcare Power of [employer] Power of [place];Living will  Healthcare Power of [place];Living will  Does patient want to make changes to medical advance directive?  No - Patient declined No - Patient declined No - Patient declined     Copy of Healthcare Power of Attorney in Chart?  Yes - validated most recent copy scanned in chart (See row information) No - copy requested No - copy requested   No - copy requested    Current Medications  (verified) Outpatient Encounter Medications as of 01/05/2023  Medication Sig   acetaminophen (TYLENOL) 500 MG tablet Take 1,000 mg by mouth every 4 (four) hours as needed for mild pain.   alendronate (FOSAMAX) 70 MG tablet Take 1 tablet (70 mg total) by mouth once a week. Take with a full glass of water on an empty stomach. (Patient taking differently: Take 70 mg by mouth every Wednesday. Take with a full glass of water on an empty stomach.)   aspirin EC 81 MG EC tablet Take 1 tablet (81 mg total) by mouth daily. Swallow whole.   atorvastatin (LIPITOR) 80 MG tablet Take 1 tablet by mouth once daily   carvedilol (COREG) 3.125 MG tablet TAKE 1 TABLET BY MOUTH TWICE DAILY WITH MEALS   Cholecalciferol (VITAMIN D3) 5000 units TABS Take 5,000 Units by mouth daily.   Dulaglutide 3 MG/0.5ML SOPN Inject 3 mg into the skin every Thursday.   ferrous sulfate 325 (65 FE) MG tablet Take 325 mg by mouth daily with breakfast.   insulin lispro protamine-lispro (HUMALOG 75/25 MIX) (75-25) 100 UNIT/ML SUSP injection Inject 25-40 Units into the skin See admin instructions. 40 units in AM and 25 units in PM   lisinopril (ZESTRIL) 10 MG tablet Take 1 tablet by mouth once daily   Multiple Vitamin (MULTI-VITAMINS) TABS Take 1 tablet by mouth daily.   omeprazole (PRILOSEC OTC) 20 MG tablet Take 40 mg by mouth daily.   ondansetron (ZOFRAN) 4 MG tablet Take 1 tablet (4 mg total) by mouth daily as  needed for nausea or vomiting. (Patient not taking: Reported on 10/01/2022)   traMADol (ULTRAM) 50 MG tablet Take 1 tablet (50 mg total) by mouth every 6 (six) hours as needed. (Patient not taking: Reported on 10/01/2022)   No facility-administered encounter medications on file as of 01/05/2023.    Allergies (verified) Codeine sulfate   History: Past Medical History:  Diagnosis Date   Arthritis    CHF (congestive heart failure) (HCC)    Chronic kidney disease    Diabetes mellitus without complication (HCC)    type 2    Headache    migraines.  None for 8-9 years   HOH (hard of hearing)    Hyperlipidemia    Hypertension    Non-ST elevation (NSTEMI) myocardial infarction (HCC) 02/2020   Wears dentures    Full upper.  no teeth on bottom   Wears hearing aid in both ears    Past Surgical History:  Procedure Laterality Date   ABDOMINAL HYSTERECTOMY  1986   BREAST BIOPSY     CARDIAC CATHETERIZATION     CATARACT EXTRACTION W/PHACO Right 10/03/2014   Procedure: CATARACT EXTRACTION PHACO AND INTRAOCULAR LENS PLACEMENT (IOC);  Surgeon: Galen Manila, MD;  Location: [facility] ORS;  Service: Ophthalmology;  Laterality: Right;  [place] 00:57 AP% 16.7 CDE 9.52 Fluid pack lot#[id number]   CATARACT EXTRACTION W/PHACO Left 10/07/2022   Procedure: CATARACT EXTRACTION PHACO AND INTRAOCULAR LENS PLACEMENT (IOC) LEFT DIABETIC  9.51  00:58.9;  Surgeon: Galen Manila, MD;  Location: [facility];  Service: Ophthalmology;  Laterality: Left;  Diabetic   COLONOSCOPY WITH PROPOFOL N/A 05/02/2021   Procedure: COLONOSCOPY WITH PROPOFOL;  Surgeon: Wyline Mood, MD;  Location: [facility] ENDOSCOPY;  Service: Gastroenterology;  Laterality: N/A;   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   Procedure: CORONARY STENT INTERVENTION;  Surgeon: Yvonne Kendall, MD;  Location: [facility] INVASIVE CV LAB;  Service: Cardiovascular;  Laterality: N/A;   ESOPHAGOGASTRODUODENOSCOPY N/A 05/02/2021   Procedure: ESOPHAGOGASTRODUODENOSCOPY (EGD);  Surgeon: Wyline Mood, MD;  Location: [facility] ENDOSCOPY;  Service: Gastroenterology;  Laterality: N/A;   ESOPHAGOGASTRODUODENOSCOPY (EGD) WITH PROPOFOL N/A 07/16/2021   Procedure: ESOPHAGOGASTRODUODENOSCOPY (EGD) WITH PROPOFOL;  Surgeon: Wyline Mood, MD;  Location: [facility] ENDOSCOPY;  Service: Gastroenterology;  Laterality: N/A;   INJECTION KNEE     LEFT HEART CATH AND CORONARY ANGIOGRAPHY N/A 03/13/2020   Procedure: LEFT HEART CATH AND CORONARY ANGIOGRAPHY;  Surgeon: Yvonne Kendall, MD;  Location: [facility] INVASIVE CV LAB;   Service: Cardiovascular;  Laterality: N/A;   Family History  Problem Relation Age of Onset   Breast cancer Mother    Emphysema Father    Breast cancer Sister 45   Social History   Socioeconomic History   Marital status: Divorced    Spouse name: Not on file   Number of children: 1   Years of education: Not on file   Highest education level: Associate degree: occupational, Scientist, product/process development, or vocational program  Occupational History   Occupation: Retired  Tobacco Use   Smoking status: Never   Smokeless tobacco: Never  Vaping Use   Vaping status: Never Used  Substance and Sexual Activity   Alcohol use: No    Alcohol/week: 0.0 standard drinks of alcohol   Drug use: No   Sexual activity: Not on file  Other Topics Concern   Not on file  Social History Narrative   Not on file   Social Determinants of Health   Financial Resource Strain: Low Risk  (01/05/2023)   Overall Financial Resource Strain (CARDIA)  Difficulty of Paying Living Expenses: Not hard at all  Food Insecurity: No Food Insecurity (01/05/2023)   Hunger Vital Sign    Worried About Running Out of Food in the Last Year: Never true    Ran Out of Food in the Last Year: Never true  Transportation Needs: No Transportation Needs (01/05/2023)   PRAPARE - Administrator, Civil Service (Medical): No    Lack of Transportation (Non-Medical): No  Physical Activity: Insufficiently Active (01/05/2023)   Exercise Vital Sign    Days of Exercise per Week: 3 days    Minutes of Exercise per Session: 30 min  Stress: No Stress Concern Present (01/05/2023)   [employer] of Occupational Health - Occupational Stress Questionnaire    Feeling of Stress : Not at all  Social Connections: Moderately Integrated (01/05/2023)   Social Connection and Isolation Panel [NHANES]    Frequency of Communication with Friends and Family: More than three times a week    Frequency of Social Gatherings with Friends and Family: More than three  times a week    Attends Religious Services: More than 4 times per year    Active Member of [employer] or Organizations: Yes    Attends Engineer, structural: More than 4 times per year    Marital Status: Divorced    Tobacco Counseling Counseling given: Not Answered   Clinical Intake:  Pre-visit preparation completed: Yes  Pain : No/denies pain   BMI - recorded: 31.01 Nutritional Status: BMI > 30  Obese Nutritional Risks: None Diabetes: Yes CBG done?: Yes (BS-159 this am at home) CBG resulted in Enter/ Edit results?: No Did pt. bring in CBG monitor from home?: No  How often do you need to have someone help you when you read instructions, pamphlets, or other written materials from your doctor or pharmacy?: 1 - Never  Interpreter Needed?: No  Comments: lives alone Information entered by :: B.Summit Arroyave,LPN   Activities of Daily Living    01/05/2023   11:29 AM 10/07/2022    6:42 AM  In your present state of health, do you have any difficulty performing the following activities:  Hearing? 1 0  Vision? 0 0  Difficulty concentrating or making decisions? 0 0  Walking or climbing stairs? 0 0  Dressing or bathing? 0 0  Doing errands, shopping? 0   Preparing Food and eating ? N   Using the Toilet? N   In the past six months, have you accidently leaked urine? N   Do you have problems with loss of bowel control? N   Managing your Medications? N   Managing your Finances? N   Housekeeping or managing your Housekeeping? N     Patient Care Team: Malva Limes, MD as PCP - General (Family Medicine) End, Cristal Deer, MD as PCP - Cardiology (Cardiology) Tedd Sias Marlana Salvage, MD as Physician Assistant (Internal Medicine) Galen Manila, MD as Referring Physician (Ophthalmology) Lyndle Herrlich, MD as Consulting Physician (Orthopedic Surgery) Lamont Dowdy, MD as Consulting Physician (Internal Medicine)  Indicate any recent Medical Services you may have received from other  than [facility] providers in the past year (date may be approximate).     Assessment:   This is a routine wellness examination for [place].  Hearing/Vision screen Hearing Screening - Comments:: Adequate hearing in rt;hearing in lft Vision Screening - Comments:: Adequate vision after cataract surgery;glasses   Goals Addressed             This Visit's Progress  COMPLETED: Exercise 3x per week (30 min per time)   On track    Recommend to exercise for 3 days a week for at least 30 minutes at a time.        Depression Screen    01/05/2023   11:26 AM 04/09/2022   10:53 AM 04/03/2021   10:08 AM 10/31/2020    2:12 PM 10/01/2020    6:00 PM 11/29/2019    9:53 AM 11/22/2018    8:49 AM  PHQ 2/9 Scores  PHQ - 2 Score 0 0 0 0 0 0 0  PHQ- 9 Score   4        Fall Risk    01/05/2023   11:21 AM 07/16/2022   10:17 AM 04/09/2022   10:53 AM 04/03/2021   10:08 AM 10/31/2020    2:11 PM  Fall Risk   Falls in the past year? 0 0 0 0 1  Number falls in past yr: 0 0  0 0  Injury with Fall? 0 0  0 0  Risk for fall due to : No Fall Risks   No Fall Risks History of fall(s);Other (Comment)  Risk for fall due to: Comment     Pt blacked out and fell  Follow up Education provided;Falls prevention discussed   Falls evaluation completed Falls evaluation completed;Falls prevention discussed;Education provided    MEDICARE RISK AT HOME: Medicare Risk at Home Any stairs in or around the home?: No If so, are there any without handrails?: No Home free of loose throw rugs in walkways, pet beds, electrical cords, etc?: Yes Adequate lighting in your home to reduce risk of falls?: Yes Life alert?: No Use of a cane, walker or w/c?: Yes (cane) Grab bars in the bathroom?: Yes Shower chair or bench in shower?: No Elevated toilet seat or a handicapped toilet?: No  TIMED UP AND GO:  Was the test performed?  No    Cognitive Function:        01/05/2023   11:32 AM 11/07/2016   10:17 AM  6CIT Screen  What Year?  0 points 0 points  What month? 0 points 0 points  What time? 0 points 0 points  Count back from 20 0 points 0 points  Months in reverse 0 points 0 points  Repeat phrase 0 points 0 points  Total Score 0 points 0 points    Immunizations Immunization History  Administered Date(s) Administered   Fluad Quad(high Dose 65+) 01/28/2019   Influenza, High Dose Seasonal PF 02/23/2016, 02/15/2018   Influenza,inj,Quad PF,6+ Mos 01/23/2017, 02/12/2021   Influenza-Unspecified 01/30/2020   Moderna Sars-Covid-2 Vaccination 06/18/2019, 07/16/2019, 02/21/2020   Pneumococcal Conjugate-13 10/11/2015   Pneumococcal Polysaccharide-23 11/10/2017   Respiratory Syncytial Virus Vaccine,Recomb Aduvanted(Arexvy) 02/22/2022   Zoster Recombinant(Shingrix) 11/29/2021   Zoster, Live 04/17/2008    TDAP status: Up to date  Flu Vaccine status: Up to date  Pneumococcal vaccine status: Up to date  Covid-19 vaccine status: Completed vaccines  Qualifies for Shingles Vaccine? Yes   Zostavax completed Yes   Shingrix Completed?: Yes  Screening Tests Health Maintenance  Topic Date Due   DTaP/Tdap/Td (1 - Tdap) Never done   Zoster Vaccines- Shingrix (2 of 2) 01/24/2022   INFLUENZA VACCINE  11/20/2022   COVID-19 Vaccine (4 - 2023-24 season) 12/21/2022   HEMOGLOBIN A1C  01/16/2023   Diabetic kidney evaluation - Urine ACR  02/28/2023   Diabetic kidney evaluation - eGFR measurement  07/18/2023   OPHTHALMOLOGY EXAM  09/11/2023   Medicare  Annual Wellness (AWV)  01/05/2024   DEXA SCAN  05/13/2025   Pneumonia Vaccine 7+ Years old  Completed   HPV VACCINES  Aged Out    Health Maintenance  Health Maintenance Due  Topic Date Due   DTaP/Tdap/Td (1 - Tdap) Never done   Zoster Vaccines- Shingrix (2 of 2) 01/24/2022   INFLUENZA VACCINE  11/20/2022   COVID-19 Vaccine (4 - 2023-24 season) 12/21/2022    Colorectal cancer screening: No longer required.   Mammogram status: No longer required due to age.  Bone  Density status: Completed yes. Results reflect: Bone density results: OSTEOPOROSIS. Repeat every 3 years.  Lung Cancer Screening: (Low Dose CT Chest recommended if Age 77-80 years, 20 pack-year currently smoking OR have quit w/in 15years.) does not qualify.   Lung Cancer Screening Referral: no  Additional Screening:  Hepatitis C Screening: does not qualify; Completed yes  Vision Screening: Recommended annual ophthalmology exams for early detection of glaucoma and other disorders of the eye. Is the patient up to date with their annual eye exam?  Yes  Who is the provider or what is the name of the office in which the patient attends annual eye exams? Dr Druscilla Brownie If pt is not established with a provider, would they like to be referred to a provider to establish care? No .   Dental Screening: Recommended annual dental exams for proper oral hygiene  Diabetic Foot Exam: Diabetic Foot Exam: Completed yes  Community Resource Referral / Chronic Care Management: CRR required this visit?  No   CCM required this visit?  No    Plan:     I have personally reviewed and noted the following in the patient's chart:   Medical and social history Use of alcohol, tobacco or illicit drugs  Current medications and supplements including opioid prescriptions. Patient is not currently taking opioid prescriptions. Functional ability and status Nutritional status Physical activity Advanced directives List of other physicians Hospitalizations, surgeries, and ER visits in previous 12 months Vitals Screenings to include cognitive, depression, and falls Referrals and appointments  In addition, I have reviewed and discussed with patient certain preventive protocols, quality metrics, and best practice recommendations. A written personalized care plan for preventive services as well as general preventive health recommendations were provided to patient.     MELLONEY QUINTRELL, LPN   0/98/1191   After  Visit Summary: (MyChart) Due to this being a telephonic visit, the after visit summary with patients personalized plan was offered to patient via MyChart   Nurse Notes: The patient states she is doing well and has no concerns or questions at this time.

## 2023-01-05 NOTE — Patient Instructions (Signed)
Ms. Balam , Thank you for taking time to come for your Medicare Wellness Visit. I appreciate your ongoing commitment to your health goals. Please review the following plan we discussed and let me know if I can assist you in the future.   Referrals/Orders/Follow-Ups/Clinician Recommendations: none  This is a list of the screening recommended for you and due dates:  Health Maintenance  Topic Date Due   DTaP/Tdap/Td vaccine (1 - Tdap) Never done   Zoster (Shingles) Vaccine (2 of 2) 01/24/2022   Flu Shot  11/20/2022   COVID-19 Vaccine (4 - 2023-24 season) 12/21/2022   Hemoglobin A1C  01/16/2023   Yearly kidney health urinalysis for diabetes  02/28/2023   Yearly kidney function blood test for diabetes  07/18/2023   Eye exam for diabetics  09/11/2023   Medicare Annual Wellness Visit  01/05/2024   DEXA scan (bone density measurement)  05/13/2025   Pneumonia Vaccine  Completed   HPV Vaccine  Aged Out    Advanced directives: (In Chart) A copy of your advanced directives are scanned into your chart should your provider ever need it.  Next Medicare Annual Wellness Visit scheduled for next year: Yes 01/06/24 @ 10:15am telephone

## 2023-01-23 DIAGNOSIS — M1712 Unilateral primary osteoarthritis, left knee: Secondary | ICD-10-CM | POA: Diagnosis not present

## 2023-01-29 ENCOUNTER — Ambulatory Visit: Payer: PPO | Admitting: Dermatology

## 2023-02-05 DIAGNOSIS — N1832 Chronic kidney disease, stage 3b: Secondary | ICD-10-CM | POA: Diagnosis not present

## 2023-02-05 DIAGNOSIS — Z794 Long term (current) use of insulin: Secondary | ICD-10-CM | POA: Diagnosis not present

## 2023-02-05 DIAGNOSIS — E1122 Type 2 diabetes mellitus with diabetic chronic kidney disease: Secondary | ICD-10-CM | POA: Diagnosis not present

## 2023-02-05 LAB — LIPID PANEL
Cholesterol: 117 (ref 0–200)
HDL: 42 (ref 35–70)
LDL Cholesterol: 37
Triglycerides: 188 — AB (ref 40–160)

## 2023-02-05 LAB — HEMOGLOBIN A1C: Hemoglobin A1C: 7.9

## 2023-02-12 DIAGNOSIS — E11319 Type 2 diabetes mellitus with unspecified diabetic retinopathy without macular edema: Secondary | ICD-10-CM | POA: Diagnosis not present

## 2023-02-12 DIAGNOSIS — N1832 Chronic kidney disease, stage 3b: Secondary | ICD-10-CM | POA: Diagnosis not present

## 2023-02-12 DIAGNOSIS — Z794 Long term (current) use of insulin: Secondary | ICD-10-CM | POA: Diagnosis not present

## 2023-02-12 DIAGNOSIS — E1129 Type 2 diabetes mellitus with other diabetic kidney complication: Secondary | ICD-10-CM | POA: Diagnosis not present

## 2023-02-12 DIAGNOSIS — E1122 Type 2 diabetes mellitus with diabetic chronic kidney disease: Secondary | ICD-10-CM | POA: Diagnosis not present

## 2023-02-12 DIAGNOSIS — E785 Hyperlipidemia, unspecified: Secondary | ICD-10-CM | POA: Diagnosis not present

## 2023-02-12 DIAGNOSIS — E1169 Type 2 diabetes mellitus with other specified complication: Secondary | ICD-10-CM | POA: Diagnosis not present

## 2023-02-12 DIAGNOSIS — R809 Proteinuria, unspecified: Secondary | ICD-10-CM | POA: Diagnosis not present

## 2023-02-12 DIAGNOSIS — E1159 Type 2 diabetes mellitus with other circulatory complications: Secondary | ICD-10-CM | POA: Diagnosis not present

## 2023-02-16 ENCOUNTER — Ambulatory Visit: Payer: PPO | Admitting: Dermatology

## 2023-02-16 DIAGNOSIS — W908XXA Exposure to other nonionizing radiation, initial encounter: Secondary | ICD-10-CM

## 2023-02-16 DIAGNOSIS — I781 Nevus, non-neoplastic: Secondary | ICD-10-CM

## 2023-02-16 DIAGNOSIS — D229 Melanocytic nevi, unspecified: Secondary | ICD-10-CM

## 2023-02-16 DIAGNOSIS — L821 Other seborrheic keratosis: Secondary | ICD-10-CM | POA: Diagnosis not present

## 2023-02-16 DIAGNOSIS — D485 Neoplasm of uncertain behavior of skin: Secondary | ICD-10-CM

## 2023-02-16 DIAGNOSIS — L578 Other skin changes due to chronic exposure to nonionizing radiation: Secondary | ICD-10-CM | POA: Diagnosis not present

## 2023-02-16 DIAGNOSIS — D2261 Melanocytic nevi of right upper limb, including shoulder: Secondary | ICD-10-CM

## 2023-02-16 DIAGNOSIS — L729 Follicular cyst of the skin and subcutaneous tissue, unspecified: Secondary | ICD-10-CM

## 2023-02-16 DIAGNOSIS — D492 Neoplasm of unspecified behavior of bone, soft tissue, and skin: Secondary | ICD-10-CM | POA: Diagnosis not present

## 2023-02-16 DIAGNOSIS — Z86018 Personal history of other benign neoplasm: Secondary | ICD-10-CM

## 2023-02-16 DIAGNOSIS — Z1283 Encounter for screening for malignant neoplasm of skin: Secondary | ICD-10-CM

## 2023-02-16 DIAGNOSIS — D1801 Hemangioma of skin and subcutaneous tissue: Secondary | ICD-10-CM | POA: Diagnosis not present

## 2023-02-16 DIAGNOSIS — L720 Epidermal cyst: Secondary | ICD-10-CM | POA: Diagnosis not present

## 2023-02-16 DIAGNOSIS — L814 Other melanin hyperpigmentation: Secondary | ICD-10-CM

## 2023-02-16 NOTE — Progress Notes (Signed)
New Patient Visit   Subjective  Margaret Payne is a 82 y.o. female who presents for the following: Skin Cancer Screening and Full Body Skin Exam  The patient presents for Total-Body Skin Exam (TBSE) for skin cancer screening and mole check. The patient has spots, moles and lesions to be evaluated, some may be new or changing and the patient may have concern these could be cancer. She has a spot on her right anterior shoulder that was rubbed by bra and bled. She also has a spot under her left arm she would like checked. 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    The following portions of the chart were reviewed this encounter and updated as appropriate: medications, allergies, medical history  Review of Systems:  No other skin or systemic complaints except as noted in HPI or Assessment and Plan.  Objective  Well appearing patient in no apparent distress; mood and affect are within normal limits.  A full examination was performed including scalp, head, eyes, ears, nose, lips, neck, chest, axillae, abdomen, back, buttocks, bilateral upper extremities, bilateral lower extremities, hands, feet, fingers, toes, fingernails, and toenails. All findings within normal limits unless otherwise noted below.   Relevant physical exam findings are noted in the Assessment and Plan.  Right anterior shoulder medial Flesh colored papule, 0.6 cm     Right anteror shoulder lateral Flesh colored papule, 0.6 cm    Assessment & Plan   SKIN CANCER SCREENING PERFORMED TODAY.  ACTINIC DAMAGE - Chronic condition, secondary to cumulative UV/sun exposure - diffuse scaly erythematous macules with underlying dyspigmentation - Recommend daily broad spectrum sunscreen SPF 30+ to sun-exposed areas, reapply every 2 hours as needed.  - Staying in the shade or wearing long sleeves, sun glasses (UVA+UVB protection) and wide brim hats (4-inch brim around the entire circumference of  the hat) are also recommended for sun protection.  - Call for new or changing lesions.  LENTIGINES, SEBORRHEIC KERATOSES, HEMANGIOMAS - Benign normal skin lesions - Benign-appearing - Call for any changes  MELANOCYTIC NEVI - Tan-brown and/or pink-flesh-colored symmetric macules and papules - Benign appearing on exam today - Observation - Call clinic for new or changing moles - Recommend daily use of broad spectrum spf 30+ sunscreen to sun-exposed areas.   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  - Call if any new or changing lesions are noted between office visits  EPIDERMAL INCLUSION CYST Exam: Subcutaneous nodule at left axilla, 1.0 cm  Benign-appearing. Exam most consistent with an epidermal inclusion cyst. Discussed that a cyst is a benign growth that can grow over time and sometimes get irritated or inflamed. Recommend observation if it is not bothersome. Discussed option of surgical excision to remove it if it is growing, symptomatic, or other changes noted. Please call for new or changing lesions so they can be evaluated.  Spider Veins - Dilated blue, purple or red veins at the lower extremities - Reassured - Smaller vessels can be treated by sclerotherapy (a procedure to inject a medicine into the veins to make them disappear) if desired, but the treatment is not covered by insurance. Larger vessels may be covered if symptomatic and we would refer to vascular surgeon if treatment desired.   Neoplasm of uncertain behavior of  skin (2) Right anterior shoulder medial  Epidermal / dermal shaving  Lesion diameter (cm):  0.6 Informed consent: discussed and consent obtained   Timeout: patient name, date of birth, surgical site, and procedure verified   Procedure prep:   Patient was prepped and draped in usual sterile fashion Prep type:  Isopropyl alcohol Anesthesia: the lesion was anesthetized in a standard fashion   Anesthetic:  1% lidocaine w/ epinephrine 1-100,000 buffered w/ 8.4% NaHCO3 Instrument used: flexible razor blade   Hemostasis achieved with: pressure, aluminum chloride and electrodesiccation   Outcome: patient tolerated procedure well   Post-procedure details: sterile dressing applied and wound care instructions given   Dressing type: bandage and petrolatum    Specimen 1 - Surgical pathology Differential Diagnosis: Irritated Nevus vs SK r/o dysplasia Check Margins: Yes  Right anteror shoulder lateral  Epidermal / dermal shaving  Lesion diameter (cm):  0.6 Informed consent: discussed and consent obtained   Timeout: patient name, date of birth, surgical site, and procedure verified   Procedure prep:  Patient was prepped and draped in usual sterile fashion Prep type:  Isopropyl alcohol Anesthesia: the lesion was anesthetized in a standard fashion   Anesthetic:  1% lidocaine w/ epinephrine 1-100,000 buffered w/ 8.4% NaHCO3 Instrument used: flexible razor blade   Hemostasis achieved with: pressure, aluminum chloride and electrodesiccation   Outcome: patient tolerated procedure well   Post-procedure details: sterile dressing applied and wound care instructions given   Dressing type: bandage and petrolatum    Specimen 2 - Surgical pathology Differential Diagnosis: Irritated Nevus vs SK r/o dysplasia Check Margins: Yes    Return in about 1 year (around 02/16/2024) for TBSE, Hx Dysplastic Nevus.  Wendee Beavers, CMA, am acting as scribe for Armida Sans, MD .   Documentation: I have reviewed the above documentation for accuracy and completeness, and I agree with the above.  Armida Sans, MD

## 2023-02-16 NOTE — Patient Instructions (Signed)

## 2023-02-16 NOTE — Progress Notes (Deleted)
   Follow-Up Visit   Subjective  Margaret Payne is a 82 y.o. female who presents for the following: Skin Cancer Screening and Full Body Skin Exam  The patient presents for Total-Body Skin Exam (TBSE) for skin cancer screening and mole check. The patient has spots, moles and lesions to be evaluated, some may be new or changing and the patient may have concern these could be cancer.    The following portions of the chart were reviewed this encounter and updated as appropriate: medications, allergies, medical history  Review of Systems:  No other skin or systemic complaints except as noted in HPI or Assessment and Plan.  Objective  Well appearing patient in no apparent distress; mood and affect are within normal limits.  A full examination was performed including scalp, head, eyes, ears, nose, lips, neck, chest, axillae, abdomen, back, buttocks, bilateral upper extremities, bilateral lower extremities, hands, feet, fingers, toes, fingernails, and toenails. All findings within normal limits unless otherwise noted below.   Relevant physical exam findings are noted in the Assessment and Plan.    Assessment & Plan   SKIN CANCER SCREENING PERFORMED TODAY.  ACTINIC DAMAGE - Chronic condition, secondary to cumulative UV/sun exposure - diffuse scaly erythematous macules with underlying dyspigmentation - Recommend daily broad spectrum sunscreen SPF 30+ to sun-exposed areas, reapply every 2 hours as needed.  - Staying in the shade or wearing long sleeves, sun glasses (UVA+UVB protection) and wide brim hats (4-inch brim around the entire circumference of the hat) are also recommended for sun protection.  - Call for new or changing lesions.  LENTIGINES, SEBORRHEIC KERATOSES, HEMANGIOMAS - Benign normal skin lesions - Benign-appearing - Call for any changes  MELANOCYTIC NEVI - Tan-brown and/or pink-flesh-colored symmetric macules and papules - Benign appearing on exam today - Observation -  Call clinic for new or changing moles - Recommend daily use of broad spectrum spf 30+ sunscreen to sun-exposed areas.        No follow-ups on file.  ***  Documentation: I have reviewed the above documentation for accuracy and completeness, and I agree with the above.  Armida Sans, MD

## 2023-02-20 LAB — SURGICAL PATHOLOGY

## 2023-02-23 ENCOUNTER — Telehealth: Payer: Self-pay

## 2023-02-23 NOTE — Telephone Encounter (Signed)
Patient informed of pathology results 

## 2023-02-23 NOTE — Telephone Encounter (Signed)
-----   Message from Armida Sans sent at 02/20/2023  6:11 PM EDT ----- FINAL DIAGNOSIS        1. Skin, right anterior shoulder medial :       MELANOCYTIC NEVUS WITH HYPERPIGMENTATION, IRRITATED, DEEP MARGIN INVOLVED        2. Skin, right anterior shoulder lateral :       MELANOCYTIC NEVUS WITH HYPERPIGMENTATION, IRRITATED, DEEP MARGIN INVOLVED   1&2 - both benign moles No further treatment needed

## 2023-02-28 ENCOUNTER — Encounter: Payer: Self-pay | Admitting: Dermatology

## 2023-03-06 ENCOUNTER — Other Ambulatory Visit: Payer: Self-pay | Admitting: Internal Medicine

## 2023-03-06 DIAGNOSIS — I255 Ischemic cardiomyopathy: Secondary | ICD-10-CM

## 2023-03-06 DIAGNOSIS — I2510 Atherosclerotic heart disease of native coronary artery without angina pectoris: Secondary | ICD-10-CM

## 2023-03-18 DIAGNOSIS — D631 Anemia in chronic kidney disease: Secondary | ICD-10-CM | POA: Diagnosis not present

## 2023-03-18 DIAGNOSIS — N1832 Chronic kidney disease, stage 3b: Secondary | ICD-10-CM | POA: Diagnosis not present

## 2023-03-18 DIAGNOSIS — I10 Essential (primary) hypertension: Secondary | ICD-10-CM | POA: Diagnosis not present

## 2023-03-18 DIAGNOSIS — E1122 Type 2 diabetes mellitus with diabetic chronic kidney disease: Secondary | ICD-10-CM | POA: Diagnosis not present

## 2023-03-18 DIAGNOSIS — M810 Age-related osteoporosis without current pathological fracture: Secondary | ICD-10-CM | POA: Diagnosis not present

## 2023-03-18 LAB — PROTEIN / CREATININE RATIO, URINE
Creatinine Random, Urine: 30
Protein Creatinine Ratio: 0.167
Protein, Urine: 5

## 2023-03-18 LAB — BASIC METABOLIC PANEL
BUN: 19 (ref 4–21)
CO2: 28 — AB (ref 13–22)
Chloride: 106 (ref 99–108)
Creatinine: 1.6 — AB (ref 0.5–1.1)
Glucose: 189
Potassium: 4.7 meq/L (ref 3.5–5.1)
Sodium: 140 (ref 137–147)

## 2023-03-18 LAB — CBC AND DIFFERENTIAL
HCT: 37 (ref 36–46)
Hemoglobin: 12.2 (ref 12.0–16.0)
Neutrophils Absolute: 4202
Platelets: 255 10*3/uL (ref 150–400)
WBC: 6.8

## 2023-03-18 LAB — CBC: RBC: 3.87 (ref 3.87–5.11)

## 2023-03-18 LAB — COMPREHENSIVE METABOLIC PANEL
Albumin: 3.9 (ref 3.5–5.0)
Calcium: 9.2 (ref 8.7–10.7)
eGFR: 31

## 2023-03-25 DIAGNOSIS — D631 Anemia in chronic kidney disease: Secondary | ICD-10-CM | POA: Diagnosis not present

## 2023-03-25 DIAGNOSIS — N1832 Chronic kidney disease, stage 3b: Secondary | ICD-10-CM | POA: Diagnosis not present

## 2023-03-25 DIAGNOSIS — E1122 Type 2 diabetes mellitus with diabetic chronic kidney disease: Secondary | ICD-10-CM | POA: Diagnosis not present

## 2023-03-25 DIAGNOSIS — R809 Proteinuria, unspecified: Secondary | ICD-10-CM | POA: Diagnosis not present

## 2023-03-25 DIAGNOSIS — N2581 Secondary hyperparathyroidism of renal origin: Secondary | ICD-10-CM | POA: Diagnosis not present

## 2023-04-13 ENCOUNTER — Encounter: Payer: Self-pay | Admitting: Family Medicine

## 2023-04-13 ENCOUNTER — Ambulatory Visit: Payer: PPO | Admitting: Family Medicine

## 2023-04-13 VITALS — BP 110/67 | HR 81 | Resp 16 | Ht 60.0 in | Wt 158.1 lb

## 2023-04-13 DIAGNOSIS — Z0001 Encounter for general adult medical examination with abnormal findings: Secondary | ICD-10-CM | POA: Diagnosis not present

## 2023-04-13 DIAGNOSIS — E785 Hyperlipidemia, unspecified: Secondary | ICD-10-CM

## 2023-04-13 DIAGNOSIS — N2581 Secondary hyperparathyroidism of renal origin: Secondary | ICD-10-CM | POA: Diagnosis not present

## 2023-04-13 DIAGNOSIS — E1169 Type 2 diabetes mellitus with other specified complication: Secondary | ICD-10-CM

## 2023-04-13 DIAGNOSIS — N1832 Chronic kidney disease, stage 3b: Secondary | ICD-10-CM | POA: Diagnosis not present

## 2023-04-13 DIAGNOSIS — I5022 Chronic systolic (congestive) heart failure: Secondary | ICD-10-CM

## 2023-04-13 DIAGNOSIS — I10 Essential (primary) hypertension: Secondary | ICD-10-CM

## 2023-04-13 DIAGNOSIS — E559 Vitamin D deficiency, unspecified: Secondary | ICD-10-CM | POA: Diagnosis not present

## 2023-04-13 DIAGNOSIS — E1121 Type 2 diabetes mellitus with diabetic nephropathy: Secondary | ICD-10-CM | POA: Diagnosis not present

## 2023-04-13 DIAGNOSIS — M810 Age-related osteoporosis without current pathological fracture: Secondary | ICD-10-CM | POA: Diagnosis not present

## 2023-04-13 DIAGNOSIS — Z794 Long term (current) use of insulin: Secondary | ICD-10-CM | POA: Diagnosis not present

## 2023-04-13 DIAGNOSIS — Z0000 Encounter for general adult medical examination without abnormal findings: Secondary | ICD-10-CM

## 2023-04-13 NOTE — Patient Instructions (Signed)
Please review the attached list of medications and notify my office if there are any errors.   Well plan on checking your electrolytes in a month, unless Dr. Tedd Sias checks them when you see her.

## 2023-04-14 NOTE — Progress Notes (Signed)
Complete physical exam   Patient: Margaret Payne   DOB: 05/22/1940   82 y.o. Female  MRN: [id number] Visit Date: 04/13/2023  Today's healthcare provider: Mila Merry, MD   Chief Complaint  Patient presents with   Annual Exam   Subjective    Discussed the use of AI scribe software for clinical note transcription with the patient, who gave verbal consent to proceed.  History of Present Illness   The patient, with a history of kidney disease, presents for routine yearly physical. Her nephrologist recently started her on Farxiga after a \\drop  in their eGFR from 43 to 33. The patient has been on the medication for approximately two weeks at the time of the visit with no side effects. The patient's last HbA1c was 7.9 in October when checked by her endocrinologist Dr. Reuel Derby  The patient also reports nocturia, waking up at least once at 3 am, and sometimes twice depending on fluid intake after supper.  The patient has recently moved into a new apartment and is enjoying the amenities, including a walking track in the backyard. The patient is currently working on increasing their walking distance.  She reports cataracts and has had surgery in both eyes.         Past Medical History:  Diagnosis Date   Arthritis    CHF (congestive heart failure) (HCC)    Chronic kidney disease    Diabetes mellitus without complication (HCC)    type 2   Headache    migraines.  None for 8-9 years   HOH (hard of hearing)    Hyperlipidemia    Hypertension    Non-ST elevation (NSTEMI) myocardial infarction (HCC) 02/2020   Wears dentures    Full upper.  no teeth on bottom   Wears hearing aid in both ears    Past Surgical History:  Procedure Laterality Date   ABDOMINAL HYSTERECTOMY  1986   BREAST BIOPSY     CARDIAC CATHETERIZATION     CATARACT EXTRACTION W/PHACO Right 10/03/2014   Procedure: CATARACT EXTRACTION PHACO AND INTRAOCULAR LENS PLACEMENT (IOC);  Surgeon: Galen Manila, MD;   Location: [facility] ORS;  Service: Ophthalmology;  Laterality: Right;  [place] 00:57 AP% 16.7 CDE 9.52 Fluid pack lot#[id number]   CATARACT EXTRACTION W/PHACO Left 10/07/2022   Procedure: CATARACT EXTRACTION PHACO AND INTRAOCULAR LENS PLACEMENT (IOC) LEFT DIABETIC  9.51  00:58.9;  Surgeon: Galen Manila, MD;  Location: [facility];  Service: Ophthalmology;  Laterality: Left;  Diabetic   COLONOSCOPY WITH PROPOFOL N/A 05/02/2021   Procedure: COLONOSCOPY WITH PROPOFOL;  Surgeon: Wyline Mood, MD;  Location: [facility];  Service: Gastroenterology;  Laterality: N/A;   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   Procedure: CORONARY STENT INTERVENTION;  Surgeon: Yvonne Kendall, MD;  Location: [facility] INVASIVE CV LAB;  Service: Cardiovascular;  Laterality: N/A;   ESOPHAGOGASTRODUODENOSCOPY N/A 05/02/2021   Procedure: ESOPHAGOGASTRODUODENOSCOPY (EGD);  Surgeon: Wyline Mood, MD;  Location: [facility] ENDOSCOPY;  Service: Gastroenterology;  Laterality: N/A;   ESOPHAGOGASTRODUODENOSCOPY (EGD) WITH PROPOFOL N/A 07/16/2021   Procedure: ESOPHAGOGASTRODUODENOSCOPY (EGD) WITH PROPOFOL;  Surgeon: Wyline Mood, MD;  Location[facility];  Service: Gastroenterology;  Laterality: N/A;   INJECTION KNEE     LEFT HEART CATH AND CORONARY ANGIOGRAPHY N/A 03/13/2020   Procedure: LEFT HEART CATH AND CORONARY ANGIOGRAPHY;  Surgeon: Yvonne Kendall, MD;  Location: [facility] INVASIVE CV LAB;  Service: Cardiovascular;  Laterality: N/A;   Social History   Socioeconomic History   Marital status: Divorced    Spouse name:  Not on file   Number of children: 1   Years of education: Not on file   Highest education level: Associate degree: occupational, Scientist, product/process development, or vocational program  Occupational History   Occupation: Retired  Tobacco Use   Smoking status: Never   Smokeless tobacco: Never  Vaping Use   Vaping status: Never Used  Substance and Sexual Activity   Alcohol use: No    Alcohol/week: 0.0 standard drinks of alcohol    Drug use: No   Sexual activity: Not on file  Other Topics Concern   Not on file  Social History Narrative   Not on file   Social Drivers of Health   Financial Resource Strain: Low Risk  (01/05/2023)   Overall Financial Resource Strain (CARDIA)    Difficulty of Paying Living Expenses: Not hard at all  Food Insecurity: No Food Insecurity (01/05/2023)   Hunger Vital Sign    Worried About Running Out of Food in the Last Year: Never true    Ran Out of Food in the Last Year: Never true  Transportation Needs: No Transportation Needs (01/05/2023)   PRAPARE - Administrator, Civil Service (Medical): No    Lack of Transportation (Non-Medical): No  Physical Activity: Insufficiently Active (01/05/2023)   Exercise Vital Sign    Days of Exercise per Week: 3 days    Minutes of Exercise per Session: 30 min  Stress: No Stress Concern Present (01/05/2023)   [employer] of Occupational Health - Occupational Stress Questionnaire    Feeling of Stress : Not at all  Social Connections: Moderately Integrated (01/05/2023)   Social Connection and Isolation Panel [NHANES]    Frequency of Communication with Friends and Family: More than three times a week    Frequency of Social Gatherings with Friends and Family: More than three times a week    Attends Religious Services: More than 4 times per year    Active Member of Clubs or Organizations: Yes    Attends Banker Meetings: More than 4 times per year    Marital Status: Divorced  Intimate Partner Violence: Not At Risk (01/05/2023)   Humiliation, Afraid, Rape, and Kick questionnaire    Fear of Current or Ex-Partner: No    Emotionally Abused: No    Physically Abused: No    Sexually Abused: No   Family Status  Relation Name Status   Mother  Deceased at age 62       old age   Father  Deceased   Sister 10 Deceased       died from respiratory issues   Daughter  Alive  No partnership data on file   Family History  Problem  Relation Age of Onset   Breast cancer Mother    Emphysema Father    Breast cancer Sister 39   Allergies  Allergen Reactions   Codeine Sulfate Nausea Only    Patient Care Team: Malva Limes, MD as PCP - General (Family Medicine) End, Cristal Deer, MD as PCP - Cardiology (Cardiology) Solum, Marlana Salvage, MD as Physician Assistant (Internal Medicine) Galen Manila, MD as Referring Physician (Ophthalmology) Lyndle Herrlich, MD as Consulting Physician (Orthopedic Surgery) Lamont Dowdy, MD as Consulting Physician (Internal Medicine)   Medications: Outpatient Medications Prior to Visit  Medication Sig   acetaminophen (TYLENOL) 500 MG tablet Take 1,000 mg by mouth every 4 (four) hours as needed for mild pain.   alendronate (FOSAMAX) 70 MG tablet Take 1 tablet (70 mg total) by mouth  once a week. Take with a full glass of water on an empty stomach. (Patient taking differently: Take 70 mg by mouth every Wednesday. Take with a full glass of water on an empty stomach.)   aspirin EC 81 MG EC tablet Take 1 tablet (81 mg total) by mouth daily. Swallow whole.   atorvastatin (LIPITOR) 80 MG tablet Take 1 tablet by mouth once daily   carvedilol (COREG) 3.125 MG tablet TAKE 1 TABLET BY MOUTH TWICE DAILY WITH MEALS   Cholecalciferol (VITAMIN D3) 5000 units TABS Take 5,000 Units by mouth daily.   dapagliflozin propanediol (FARXIGA) 10 MG TABS tablet Take 10 mg by mouth in the morning.   Dulaglutide 3 MG/0.5ML SOPN Inject 3 mg into the skin every Thursday.   ferrous sulfate 325 (65 FE) MG tablet Take 325 mg by mouth daily with breakfast.   insulin lispro protamine-lispro (HUMALOG 75/25 MIX) (75-25) 100 UNIT/ML SUSP injection Inject 25-40 Units into the skin See admin instructions. 40 units in AM and 25 units in PM   lisinopril (ZESTRIL) 10 MG tablet Take 1 tablet by mouth once daily   Multiple Vitamin (MULTI-VITAMINS) TABS Take 1 tablet by mouth daily.   omeprazole (PRILOSEC OTC) 20 MG tablet Take 40 mg  by mouth daily.   [DISCONTINUED] ondansetron (ZOFRAN) 4 MG tablet Take 1 tablet (4 mg total) by mouth daily as needed for nausea or vomiting. (Patient not taking: Reported on 10/01/2022)   [DISCONTINUED] traMADol (ULTRAM) 50 MG tablet Take 1 tablet (50 mg total) by mouth every 6 (six) hours as needed. (Patient not taking: Reported on 10/01/2022)   No facility-administered medications prior to visit.     Objective    BP 110/67 (BP Location: Left Arm, Patient Position: Sitting, Cuff Size: Normal)   Pulse 81   Resp 16   Ht 5' (1.524 m)   Wt 158 lb 1.6 oz (71.7 kg)   BMI 30.88 kg/m    Physical Exam  General Appearance:    Mildly obese female. Alert, cooperative, in no acute distress, appears stated age   Head:    Normocephalic, without obvious abnormality, atraumatic  Eyes:    PERRL, conjunctiva/corneas clear, EOM's intact, fundi    benign, both eyes  Ears:    Normal TM's and external ear canals, both ears  Nose:   Nares normal, septum midline, mucosa normal, no drainage    or sinus tenderness  Throat:   Lips, mucosa, and tongue normal; teeth and gums normal  Neck:   Supple, symmetrical, trachea midline, no adenopathy;    thyroid:  no enlargement/tenderness/nodules; no carotid   bruit or JVD  Back:     Symmetric, no curvature, ROM normal, no CVA tenderness  Lungs:     Clear to auscultation bilaterally, respirations unlabored  Chest Wall:    No tenderness or deformity   Heart:    Normal heart rate. Normal rhythm. No murmurs, rubs, or gallops.   Breast Exam:    deferred  Abdomen:     Soft, non-tender, bowel sounds active all four quadrants,    no masses, no organomegaly  Pelvic:    deferred  Extremities:   All extremities are intact. No cyanosis or edema  Pulses:   2+ and symmetric all extremities  Skin:   Skin color, texture, turgor normal, no rashes. Numerous seborrheic keratoses on back, no suspicious lesions.   Lymph nodes:   Cervical, supraclavicular, and axillary nodes normal   Neurologic:   CNII-XII intact, normal strength, sensation and  reflexes    throughout       Last depression screening scores    01/05/2023   11:26 AM 04/09/2022   10:53 AM 04/03/2021   10:08 AM  PHQ 2/9 Scores  PHQ - 2 Score 0 0 0  PHQ- 9 Score   4   Last fall risk screening    01/05/2023   11:21 AM  Fall Risk   Falls in the past year? 0  Number falls in past yr: 0  Injury with Fall? 0  Risk for fall due to : No Fall Risks  Follow up Education provided;Falls prevention discussed   Last Audit-C alcohol use screening    01/05/2023   11:25 AM  Alcohol Use Disorder Test (AUDIT)  1. How often do you have a drink containing alcohol? 0  2. How many drinks containing alcohol do you have on a typical day when you are drinking? 0  3. How often do you have six or more drinks on one occasion? 0  AUDIT-C Score 0   A score of 3 or more in women, and 4 or more in men indicates increased risk for alcohol abuse, EXCEPT if all of the points are from question 1     Assessment & Plan    Routine Health Maintenance and Physical Exam  Exercise Activities and Dietary recommendations  Goals   None     Immunization History  Administered Date(s) Administered   Fluad Quad(high Dose 65+) 01/28/2019   Influenza, High Dose Seasonal PF 02/23/2016, 02/15/2018, 02/24/2023   Influenza,inj,Quad PF,6+ Mos 01/23/2017, 02/12/2021   Influenza-Unspecified 01/30/2020   Moderna Sars-Covid-2 Vaccination 06/18/2019, 07/16/2019, 02/21/2020   Pneumococcal Conjugate-13 10/11/2015   Pneumococcal Polysaccharide-23 11/10/2017   Respiratory Syncytial Virus Vaccine,Recomb Aduvanted(Arexvy) 02/22/2022   Zoster Recombinant(Shingrix) 11/29/2021   Zoster, Live 04/17/2008    Health Maintenance  Topic Date Due   DTaP/Tdap/Td (1 - Tdap) Never done   Zoster Vaccines- Shingrix (2 of 2) 01/24/2022   COVID-19 Vaccine (4 - 2024-25 season) 12/21/2022   HEMOGLOBIN A1C  08/06/2023   OPHTHALMOLOGY EXAM  09/11/2023    Medicare Annual Wellness (AWV)  01/05/2024   Diabetic kidney evaluation - eGFR measurement  03/17/2024   Diabetic kidney evaluation - Urine ACR  03/17/2024   DEXA SCAN  05/13/2025   Pneumonia Vaccine 54+ Years old  Completed   INFLUENZA VACCINE  Completed   HPV VACCINES  Aged Out    Discussed health benefits of physical activity, and encouraged her to engage in regular exercise appropriate for her age and condition.     Chronic Kidney Disease Recent decrease in kidney function (from 43 to 33) and initiation of Farxiga by nephrologist. Discussed the benefits of Farxiga on kidney and heart function, as well as the potential side effects including electrolyte imbalances and increased risk of urinary tract infections. -Check electrolytes at the end of January unless checked by endocrinologist during January visit. -Advise patient to maintain good hygiene to prevent urinary tract infections.  Diabetes Recent HbA1c of 7.9. Discussed potential improvement with the initiation of Farxiga by her nephrologist. No current side effects. Counseled on prevention of GU infections. -Continue current management and follow up with endocrinologist in January as scheduled. advised she will need her electrolytes checked either by her endocrinologist or here.   -Schedule follow-up appointment in six months for routine check-up.     Return in about 6 months (around 10/12/2023) for Hypertension.        Mila Merry, MD  [facility]  [facility] [phone] (phone) [phone] (fax)  [facility]

## 2023-04-19 ENCOUNTER — Other Ambulatory Visit: Payer: Self-pay | Admitting: Family Medicine

## 2023-05-02 ENCOUNTER — Other Ambulatory Visit: Payer: Self-pay | Admitting: Internal Medicine

## 2023-05-02 DIAGNOSIS — E785 Hyperlipidemia, unspecified: Secondary | ICD-10-CM

## 2023-05-20 DIAGNOSIS — E1129 Type 2 diabetes mellitus with other diabetic kidney complication: Secondary | ICD-10-CM | POA: Diagnosis not present

## 2023-05-20 DIAGNOSIS — R809 Proteinuria, unspecified: Secondary | ICD-10-CM | POA: Diagnosis not present

## 2023-05-20 DIAGNOSIS — Z794 Long term (current) use of insulin: Secondary | ICD-10-CM | POA: Diagnosis not present

## 2023-05-25 DIAGNOSIS — R809 Proteinuria, unspecified: Secondary | ICD-10-CM | POA: Diagnosis not present

## 2023-05-25 DIAGNOSIS — E785 Hyperlipidemia, unspecified: Secondary | ICD-10-CM | POA: Diagnosis not present

## 2023-05-25 DIAGNOSIS — E1129 Type 2 diabetes mellitus with other diabetic kidney complication: Secondary | ICD-10-CM | POA: Diagnosis not present

## 2023-05-25 DIAGNOSIS — E1159 Type 2 diabetes mellitus with other circulatory complications: Secondary | ICD-10-CM | POA: Diagnosis not present

## 2023-05-25 DIAGNOSIS — N1832 Chronic kidney disease, stage 3b: Secondary | ICD-10-CM | POA: Diagnosis not present

## 2023-05-25 DIAGNOSIS — Z794 Long term (current) use of insulin: Secondary | ICD-10-CM | POA: Diagnosis not present

## 2023-05-25 DIAGNOSIS — E11319 Type 2 diabetes mellitus with unspecified diabetic retinopathy without macular edema: Secondary | ICD-10-CM | POA: Diagnosis not present

## 2023-05-25 DIAGNOSIS — E1169 Type 2 diabetes mellitus with other specified complication: Secondary | ICD-10-CM | POA: Diagnosis not present

## 2023-05-25 DIAGNOSIS — E1122 Type 2 diabetes mellitus with diabetic chronic kidney disease: Secondary | ICD-10-CM | POA: Diagnosis not present

## 2023-05-29 DIAGNOSIS — M1712 Unilateral primary osteoarthritis, left knee: Secondary | ICD-10-CM | POA: Diagnosis not present

## 2023-06-02 ENCOUNTER — Other Ambulatory Visit: Payer: Self-pay | Admitting: Internal Medicine

## 2023-06-02 DIAGNOSIS — I2510 Atherosclerotic heart disease of native coronary artery without angina pectoris: Secondary | ICD-10-CM

## 2023-06-02 DIAGNOSIS — I255 Ischemic cardiomyopathy: Secondary | ICD-10-CM

## 2023-06-02 NOTE — Telephone Encounter (Signed)
Please contact pt for future appointment. Pt due for follow up.

## 2023-06-05 DIAGNOSIS — H43813 Vitreous degeneration, bilateral: Secondary | ICD-10-CM | POA: Diagnosis not present

## 2023-06-05 DIAGNOSIS — Z961 Presence of intraocular lens: Secondary | ICD-10-CM | POA: Diagnosis not present

## 2023-06-05 DIAGNOSIS — E11319 Type 2 diabetes mellitus with unspecified diabetic retinopathy without macular edema: Secondary | ICD-10-CM | POA: Diagnosis not present

## 2023-06-05 LAB — HM DIABETES EYE EXAM

## 2023-06-17 NOTE — Progress Notes (Unsigned)
 Cardiology Clinic Note   Date: 06/19/2023 ID: Amma, Crear 06/17/1940, MRN [id number]  Primary Cardiologist:  Yvonne Kendall, MD  Chief Complaint   Margaret Payne is a 83 y.o. female who presents to the clinic today for routine annual follow up.   Patient Profile   Margaret Payne is followed by Dr. Okey Dupre for the history outlined below.      Past medical history significant for: CAD. LHC 03/13/2020 (NSTEMI): Severe single-vessel coronary artery disease with sequential 99% and 70% mid LAD stenoses.  Mild to moderate nonobstructive CAD also noted in ostial LAD, ramus intermedius, distal LCx, and proximal to mid RCA.  PCI with DES 2.5 x 34 mm to mid LAD. Chronic HFmrEF/ischemic cardiomyopathy. Echo 03/13/2020: EF 40 to 45%.  Regional wall motion abnormalities.  Grade I DD.  Normal RV size/function.  No significant valvular abnormalities. Limited echo 06/14/2020: EF 60 to 65%.  No RWMA.  Normal strain.  Normal RV size/function.  No significant valvular abnormalities. Hypertension. Hyperlipidemia. Lipid panel 02/05/2023: LDL 37, HDL 42, TG 188, total 117. T2DM. CKD stage IIIb.  In summary, patient with a history of CAD.  She had an NSTEMI in November 2021 and underwent PCI to LAD as detailed above.  Echo at that time showed mildly decreased LV function as detailed above.  Repeat echo February 2022 showed recovered LV function as detailed above.  Patient was last seen in the office by Dr. Okey Dupre on 06/13/2022 for routine follow-up.  She complains of occasional dependent lower extremity edema.  She was euvolemic with well-controlled BP.  No medication changes were made.     History of Present Illness    Today, patient is accompanied by her granddaughter and great-granddaughter. She is doing well. Patient denies shortness of breath, dyspnea on exertion, orthopnea or PND. Patient reports occasional lower extremity edema typically related to "something I eat."  No chest pain, pressure, or  tightness. No palpitations.  She recently moved into a retirement community. There is a walking track that she has been utilizing weather permitting. She also does chair exercises.     ROS: All other systems reviewed and are otherwise negative except as noted in History of Present Illness.  EKGs/Labs Reviewed    EKG Interpretation Date/Time:  Friday June 19 2023 10:10:23 EST Ventricular Rate:  75 PR Interval:  174 QRS Duration:  84 QT Interval:  378 QTC Calculation: 422 R Axis:   21  Text Interpretation: Normal sinus rhythm Normal ECG When compared with ECG of 16-Jul-2022 19:42, PREVIOUS ECG IS PRESENT Confirmed by Carlos Levering [phone]) on 06/19/2023 10:18:10 AM   07/17/2022: ALT 13; AST 16 03/18/2023: BUN 19; Creatinine 1.6; Potassium 4.7; Sodium 140   03/18/2023: Hemoglobin 12.2; WBC 6.8    Physical Exam    VS:  BP (!) 106/50   Pulse 75   Ht 5' (1.524 m)   Wt 155 lb 9.6 oz (70.6 kg)   SpO2 98%   BMI 30.39 kg/m  , BMI Body mass index is 30.39 kg/m.  GEN: Well nourished, well developed, in no acute distress. Neck: No JVD or carotid bruits. Cardiac:  RRR. No murmurs. No rubs or gallops.   Respiratory:  Respirations regular and unlabored. Clear to auscultation without rales, wheezing or rhonchi. GI: Soft, nontender, nondistended. Extremities: Radials/DP/PT 2+ and equal bilaterally. No clubbing or cyanosis. Mild, nonpitting edema right lower extremity, trace edema left lower extremity.   Skin: Warm and dry, no rash. Neuro: Strength intact.  Assessment & Plan   CAD S/p PCI with DES 2.5 x 34 mm to mid LAD in the setting of NSTEMI November 2021.  Patient denies chest pain, pressure or tightness. She is active doing chair exercises. She recently moved into a retirement community with a walking track. She tries to walk when the weather is good.  -Continue aspirin, atorvastatin, carvedilol.  Chronic HFmrEF/ischemic cardiomyopathy with improved LV function Echo  February 2022 demonstrated normal LV/RV function, no RWMA, no significant valvular abnormalities.  Patient reports occasional lower extremity edema typically related to what she eats. She has trace left lower extremity edema and mild nonpitting right lower extremity edema.  Euvolemic and well compensated on exam. -Continue carvedilol, lisinopril, Farxiga.  Hypertension BP today 106/50 on intake and 120/60 on my recheck. No lightheadedness or dizziness. She performs slow position changes particularly sit to stand then walking.  -Continue carvedilol, lisinopril.  Hyperlipidemia LDL October 2024 37, at goal. -Continue atorvastatin.  Disposition: Return in 1 year or sooner as needed.          Signed, Etta Grandchild. Neelie Welshans, DNP, NP-C

## 2023-06-18 ENCOUNTER — Other Ambulatory Visit: Payer: Self-pay | Admitting: Internal Medicine

## 2023-06-18 DIAGNOSIS — I25118 Atherosclerotic heart disease of native coronary artery with other forms of angina pectoris: Secondary | ICD-10-CM

## 2023-06-19 ENCOUNTER — Ambulatory Visit: Payer: PPO | Attending: Student | Admitting: Student

## 2023-06-19 ENCOUNTER — Encounter: Payer: Self-pay | Admitting: Student

## 2023-06-19 VITALS — BP 106/50 | HR 75 | Ht 60.0 in | Wt 155.6 lb

## 2023-06-19 DIAGNOSIS — I10 Essential (primary) hypertension: Secondary | ICD-10-CM

## 2023-06-19 DIAGNOSIS — I2510 Atherosclerotic heart disease of native coronary artery without angina pectoris: Secondary | ICD-10-CM | POA: Diagnosis not present

## 2023-06-19 DIAGNOSIS — E785 Hyperlipidemia, unspecified: Secondary | ICD-10-CM

## 2023-06-19 DIAGNOSIS — I5032 Chronic diastolic (congestive) heart failure: Secondary | ICD-10-CM

## 2023-06-19 NOTE — Patient Instructions (Signed)
 Medication Instructions:  Your Physician recommend you continue on your current medication as directed.    *If you need a refill on your cardiac medications before your next appointment, please call your pharmacy*   Lab Work: None ordered at this time  If you have labs (blood work) drawn today and your tests are completely normal, you will receive your results only by: MyChart Message (if you have MyChart) OR A paper copy in the mail If you have any lab test that is abnormal or we need to change your treatment, we will call you to review the results.   Follow-Up: At [facility], you and your health needs are our priority.  As part of our continuing mission to provide you with exceptional heart care, we have created designated Provider Care Teams.  These Care Teams include your primary Cardiologist (physician) and Advanced Practice Providers (APPs -  Physician Assistants and Nurse Practitioners) who all work together to provide you with the care you need, when you need it.  We recommend signing up for the patient portal called "MyChart".  Sign up information is provided on this After Visit Summary.  MyChart is used to connect with patients for Virtual Visits (Telemedicine).  Patients are able to view lab/test results, encounter notes, upcoming appointments, etc.  Non-urgent messages can be sent to your provider as well.   To learn more about what you can do with MyChart, go to [email or web address].    Your next appointment:   1 year(s)  Provider:   You may see Yvonne Kendall, MD or Carlos Levering, NP

## 2023-07-31 ENCOUNTER — Other Ambulatory Visit: Payer: Self-pay | Admitting: Internal Medicine

## 2023-07-31 DIAGNOSIS — E785 Hyperlipidemia, unspecified: Secondary | ICD-10-CM

## 2023-08-17 DIAGNOSIS — E1159 Type 2 diabetes mellitus with other circulatory complications: Secondary | ICD-10-CM | POA: Diagnosis not present

## 2023-08-17 LAB — HEMOGLOBIN A1C: Hemoglobin A1C: 7.6

## 2023-08-24 DIAGNOSIS — Z794 Long term (current) use of insulin: Secondary | ICD-10-CM | POA: Diagnosis not present

## 2023-08-24 DIAGNOSIS — N184 Chronic kidney disease, stage 4 (severe): Secondary | ICD-10-CM | POA: Diagnosis not present

## 2023-08-24 DIAGNOSIS — E1169 Type 2 diabetes mellitus with other specified complication: Secondary | ICD-10-CM | POA: Diagnosis not present

## 2023-08-24 DIAGNOSIS — E11319 Type 2 diabetes mellitus with unspecified diabetic retinopathy without macular edema: Secondary | ICD-10-CM | POA: Diagnosis not present

## 2023-08-24 DIAGNOSIS — R809 Proteinuria, unspecified: Secondary | ICD-10-CM | POA: Diagnosis not present

## 2023-08-24 DIAGNOSIS — E785 Hyperlipidemia, unspecified: Secondary | ICD-10-CM | POA: Diagnosis not present

## 2023-08-24 DIAGNOSIS — E1129 Type 2 diabetes mellitus with other diabetic kidney complication: Secondary | ICD-10-CM | POA: Diagnosis not present

## 2023-08-24 DIAGNOSIS — E1159 Type 2 diabetes mellitus with other circulatory complications: Secondary | ICD-10-CM | POA: Diagnosis not present

## 2023-08-24 DIAGNOSIS — E1122 Type 2 diabetes mellitus with diabetic chronic kidney disease: Secondary | ICD-10-CM | POA: Diagnosis not present

## 2023-08-28 DIAGNOSIS — M1712 Unilateral primary osteoarthritis, left knee: Secondary | ICD-10-CM | POA: Diagnosis not present

## 2023-08-30 ENCOUNTER — Other Ambulatory Visit: Payer: Self-pay | Admitting: Internal Medicine

## 2023-08-30 DIAGNOSIS — I255 Ischemic cardiomyopathy: Secondary | ICD-10-CM

## 2023-08-30 DIAGNOSIS — I2510 Atherosclerotic heart disease of native coronary artery without angina pectoris: Secondary | ICD-10-CM

## 2023-09-13 ENCOUNTER — Other Ambulatory Visit: Payer: Self-pay | Admitting: Internal Medicine

## 2023-09-13 DIAGNOSIS — I25118 Atherosclerotic heart disease of native coronary artery with other forms of angina pectoris: Secondary | ICD-10-CM

## 2023-09-23 DIAGNOSIS — E1122 Type 2 diabetes mellitus with diabetic chronic kidney disease: Secondary | ICD-10-CM | POA: Diagnosis not present

## 2023-09-23 DIAGNOSIS — N2581 Secondary hyperparathyroidism of renal origin: Secondary | ICD-10-CM | POA: Diagnosis not present

## 2023-09-23 DIAGNOSIS — D631 Anemia in chronic kidney disease: Secondary | ICD-10-CM | POA: Diagnosis not present

## 2023-09-23 DIAGNOSIS — R809 Proteinuria, unspecified: Secondary | ICD-10-CM | POA: Diagnosis not present

## 2023-09-23 DIAGNOSIS — N1832 Chronic kidney disease, stage 3b: Secondary | ICD-10-CM | POA: Diagnosis not present

## 2023-09-30 DIAGNOSIS — D631 Anemia in chronic kidney disease: Secondary | ICD-10-CM | POA: Diagnosis not present

## 2023-09-30 DIAGNOSIS — N2581 Secondary hyperparathyroidism of renal origin: Secondary | ICD-10-CM | POA: Diagnosis not present

## 2023-09-30 DIAGNOSIS — R809 Proteinuria, unspecified: Secondary | ICD-10-CM | POA: Diagnosis not present

## 2023-09-30 DIAGNOSIS — N1832 Chronic kidney disease, stage 3b: Secondary | ICD-10-CM | POA: Diagnosis not present

## 2023-09-30 DIAGNOSIS — E1122 Type 2 diabetes mellitus with diabetic chronic kidney disease: Secondary | ICD-10-CM | POA: Diagnosis not present

## 2023-10-02 ENCOUNTER — Other Ambulatory Visit: Payer: Self-pay | Admitting: Family Medicine

## 2023-10-02 ENCOUNTER — Encounter: Payer: Self-pay | Admitting: Family Medicine

## 2023-10-12 ENCOUNTER — Ambulatory Visit: Payer: Self-pay | Admitting: Family Medicine

## 2023-10-16 ENCOUNTER — Encounter: Payer: Self-pay | Admitting: Family Medicine

## 2023-10-16 ENCOUNTER — Ambulatory Visit (INDEPENDENT_AMBULATORY_CARE_PROVIDER_SITE_OTHER): Admitting: Family Medicine

## 2023-10-16 VITALS — BP 97/58 | HR 77 | Resp 16 | Wt 149.7 lb

## 2023-10-16 DIAGNOSIS — I10 Essential (primary) hypertension: Secondary | ICD-10-CM | POA: Diagnosis not present

## 2023-10-16 DIAGNOSIS — E1121 Type 2 diabetes mellitus with diabetic nephropathy: Secondary | ICD-10-CM

## 2023-10-16 DIAGNOSIS — N1832 Chronic kidney disease, stage 3b: Secondary | ICD-10-CM

## 2023-10-16 DIAGNOSIS — R140 Abdominal distension (gaseous): Secondary | ICD-10-CM

## 2023-10-16 DIAGNOSIS — Z794 Long term (current) use of insulin: Secondary | ICD-10-CM

## 2023-10-16 NOTE — Progress Notes (Signed)
 Established patient visit   Patient: Margaret Payne   DOB: 09/18/1940   82 y.o. Female  MRN: [id number] Visit Date: 10/16/2023  Today's healthcare provider: Nancyann Perry, MD   No chief complaint on file.  Subjective    Discussed the use of AI scribe software for clinical note transcription with the patient, who gave verbal consent to proceed.  History of Present Illness   Margaret Payne is an 83 year old female with diabetes who presents for a routine checkup.  She feels generally well and engages in exercise twice a week. Her blood sugar level is 7.6, which has slightly improved. She is currently taking Trulicity for diabetes management.  She monitors her blood pressure at home weekly or when she feels different, with readings around 110/75 mmHg. She experiences occasional swelling in her feet and ankles, which she attributes to salt intake and hot weather. She cooks at home and avoids adding salt. No chest pain, heart flutters, or breathing difficulties.  She experiences bloating and gas, which sometimes leads her to stay home from activities to avoid embarrassment. She consumes a lot of ice cream.  She recently consulted her nephrologist, who confirmed her potassium and magnesium  levels are normal. She experiences occasional sleep disturbances, waking up at 3 AM to use the bathroom, but generally maintains good sleep habits. She stays active with exercise, book club, Bible study, and sewing.       Medications: Outpatient Medications Prior to Visit  Medication Sig   acetaminophen  (TYLENOL ) 500 MG tablet Take 1,000 mg by mouth every 4 (four) hours as needed for mild pain.   alendronate  (FOSAMAX ) 70 MG tablet TAKE 1 TABLET BY MOUTH ONCE A WEEK ON  WEDNESDAY  WITH  A  FULL  GLASS  OF  WATER  ON  AN  EMPTY  STOMACH   aspirin  EC 81 MG EC tablet Take 1 tablet (81 mg total) by mouth daily. Swallow whole.   atorvastatin  (LIPITOR ) 80 MG tablet Take 1 tablet by mouth once daily    carvedilol  (COREG ) 3.125 MG tablet TAKE 1 TABLET BY MOUTH TWICE DAILY WITH MEALS   Cholecalciferol (VITAMIN D3) 5000 units TABS Take 5,000 Units by mouth daily.   dapagliflozin propanediol (FARXIGA) 10 MG TABS tablet Take 10 mg by mouth in the morning.   Dulaglutide 4.5 MG/0.5ML SOAJ Inject 4.5 mg into the skin.   ferrous sulfate  325 (65 FE) MG tablet Take 325 mg by mouth daily with breakfast.   insulin  lispro protamine-lispro (HUMALOG 75/25 MIX) (75-25) 100 UNIT/ML SUSP injection Inject 25-40 Units into the skin See admin instructions. 40 units in AM and 25 units in PM   lisinopril  (ZESTRIL ) 10 MG tablet Take 1 tablet (10 mg total) by mouth daily.   Multiple Vitamin (MULTI-VITAMINS) TABS Take 1 tablet by mouth daily.   omeprazole  (PRILOSEC  OTC) 20 MG tablet Take 40 mg by mouth daily.   Dulaglutide 3 MG/0.5ML SOPN Inject 3 mg into the skin every Thursday.   No facility-administered medications prior to visit.   Review of Systems  Constitutional:  Negative for appetite change, chills, fatigue and fever.  Respiratory:  Negative for chest tightness and shortness of breath.   Cardiovascular:  Negative for chest pain and palpitations.  Gastrointestinal:  Negative for abdominal pain, nausea and vomiting.  Neurological:  Negative for dizziness and weakness.       Objective    BP (!) 97/58 (BP Location: Left Arm, Patient Position: Sitting, Cuff  Size: Normal)   Pulse 77   Resp 16   Wt 149 lb 11.2 oz (67.9 kg)   SpO2 99%   BMI 29.24 kg/m   Physical Exam    General appearance: Well developed, well nourished female, cooperative and in no acute distress Head: Normocephalic, without obvious abnormality, atraumatic Respiratory: Respirations even and unlabored, normal respiratory rate Extremities: All extremities are intact.  Skin: Skin color, texture, turgor normal. No rashes seen  Psych: Appropriate mood and affect. Neurologic: Mental status: Alert, oriented to person, place, and time,  thought content appropriate.   Results for orders placed or performed in visit on 10/16/23  Hemoglobin A1c  Result Value Ref Range   Hemoglobin A1C 7.6      Assessment & Plan        Type 2 Diabetes Mellitus Suboptimal glycemic control with HbA1c of 7.6%. - Continue Trulicity.  Hypertension Well-controlled with average readings of 110/75 mmHg. - Continue current antihypertensive regimen.  Peripheral Edema Mild edema likely due to dietary salt and hot weather.  Abdominal Bloating Intermittent bloating possibly related to dairy intake. - Advise trial of dairy elimination for two weeks. - Consider lactase enzyme supplements if symptoms persist.  Asymptomatic Genital Lesions Likely benign cysts, no signs of infection or malignancy. - Refer to gynecologist if a large, solid lump develops.  CKD Stable continue routine nephrology follow up  General Health Maintenance Active lifestyle with occasional sleep disturbances possibly due to nocturia. - Encourage continued physical activity and social engagement. - Schedule annual checkup and labs in six months.    Return in about 6 months (around 04/16/2024) for Yearly Physical.     Nancyann Perry, MD  [facility] [phone] (phone) [phone] (fax)  [facility]

## 2023-10-16 NOTE — Patient Instructions (Signed)
 Marland Kitchen  Please review the attached list of medications and notify my office if there are any errors.   . Please bring all of your medications to every appointment so we can make sure that our medication list is the same as yours.

## 2023-10-17 LAB — MICROALBUMIN / CREATININE URINE RATIO
Creatinine, Urine: 81.7 mg/dL
Microalb/Creat Ratio: <4 mg/g{creat} (ref 0–29)
Microalbumin, Urine: <3 ug/mL

## 2023-11-23 DIAGNOSIS — E1159 Type 2 diabetes mellitus with other circulatory complications: Secondary | ICD-10-CM | POA: Diagnosis not present

## 2023-11-30 DIAGNOSIS — E1159 Type 2 diabetes mellitus with other circulatory complications: Secondary | ICD-10-CM | POA: Diagnosis not present

## 2023-11-30 DIAGNOSIS — N1832 Chronic kidney disease, stage 3b: Secondary | ICD-10-CM | POA: Diagnosis not present

## 2023-11-30 DIAGNOSIS — Z1331 Encounter for screening for depression: Secondary | ICD-10-CM | POA: Diagnosis not present

## 2023-11-30 DIAGNOSIS — E1122 Type 2 diabetes mellitus with diabetic chronic kidney disease: Secondary | ICD-10-CM | POA: Diagnosis not present

## 2023-11-30 DIAGNOSIS — E785 Hyperlipidemia, unspecified: Secondary | ICD-10-CM | POA: Diagnosis not present

## 2023-11-30 DIAGNOSIS — E11319 Type 2 diabetes mellitus with unspecified diabetic retinopathy without macular edema: Secondary | ICD-10-CM | POA: Diagnosis not present

## 2023-11-30 DIAGNOSIS — R809 Proteinuria, unspecified: Secondary | ICD-10-CM | POA: Diagnosis not present

## 2023-11-30 DIAGNOSIS — E1169 Type 2 diabetes mellitus with other specified complication: Secondary | ICD-10-CM | POA: Diagnosis not present

## 2023-11-30 DIAGNOSIS — Z794 Long term (current) use of insulin: Secondary | ICD-10-CM | POA: Diagnosis not present

## 2023-11-30 DIAGNOSIS — E1129 Type 2 diabetes mellitus with other diabetic kidney complication: Secondary | ICD-10-CM | POA: Diagnosis not present

## 2023-12-07 DIAGNOSIS — M1712 Unilateral primary osteoarthritis, left knee: Secondary | ICD-10-CM | POA: Diagnosis not present

## 2023-12-28 DIAGNOSIS — M1712 Unilateral primary osteoarthritis, left knee: Secondary | ICD-10-CM | POA: Diagnosis not present

## 2024-01-06 ENCOUNTER — Ambulatory Visit: Payer: Self-pay

## 2024-02-17 ENCOUNTER — Ambulatory Visit: Payer: PPO | Admitting: Dermatology

## 2024-02-17 DIAGNOSIS — Z1283 Encounter for screening for malignant neoplasm of skin: Secondary | ICD-10-CM | POA: Diagnosis not present

## 2024-02-17 DIAGNOSIS — W908XXA Exposure to other nonionizing radiation, initial encounter: Secondary | ICD-10-CM

## 2024-02-17 DIAGNOSIS — L820 Inflamed seborrheic keratosis: Secondary | ICD-10-CM

## 2024-02-17 DIAGNOSIS — Z86018 Personal history of other benign neoplasm: Secondary | ICD-10-CM

## 2024-02-17 DIAGNOSIS — L814 Other melanin hyperpigmentation: Secondary | ICD-10-CM | POA: Diagnosis not present

## 2024-02-17 DIAGNOSIS — D1801 Hemangioma of skin and subcutaneous tissue: Secondary | ICD-10-CM | POA: Diagnosis not present

## 2024-02-17 DIAGNOSIS — D229 Melanocytic nevi, unspecified: Secondary | ICD-10-CM

## 2024-02-17 DIAGNOSIS — L720 Epidermal cyst: Secondary | ICD-10-CM

## 2024-02-17 DIAGNOSIS — L729 Follicular cyst of the skin and subcutaneous tissue, unspecified: Secondary | ICD-10-CM

## 2024-02-17 DIAGNOSIS — L578 Other skin changes due to chronic exposure to nonionizing radiation: Secondary | ICD-10-CM

## 2024-02-17 DIAGNOSIS — L821 Other seborrheic keratosis: Secondary | ICD-10-CM

## 2024-02-17 NOTE — Patient Instructions (Signed)

## 2024-02-17 NOTE — Progress Notes (Signed)
 Follow-Up Visit   Subjective  Margaret Payne is a 83 y.o. female who presents for the following: Skin Cancer Screening and Full Body Skin Exam  The patient presents for Total-Body Skin Exam (TBSE) for skin cancer screening and mole check. The patient has spots, moles and lesions to be evaluated, some may be new or changing and the patient may have concern these could be cancer.  The following portions of the chart were reviewed this encounter and updated as appropriate: medications, allergies, medical history  Review of Systems:  No other skin or systemic complaints except as noted in HPI or Assessment and Plan.  Objective  Well appearing patient in no apparent distress; mood and affect are within normal limits.  A full examination was performed including scalp, head, eyes, ears, nose, lips, neck, chest, axillae, abdomen, back, buttocks, bilateral upper extremities, bilateral lower extremities, hands, feet, fingers, toes, fingernails, and toenails. All findings within normal limits unless otherwise noted below.   Relevant physical exam findings are noted in the Assessment and Plan.  R shoulder and back x 3 (3) Erythematous stuck-on, waxy papule or plaque  Assessment & Plan   SKIN CANCER SCREENING PERFORMED TODAY.  ACTINIC DAMAGE - Chronic condition, secondary to cumulative UV/sun exposure - diffuse scaly erythematous macules with underlying dyspigmentation - Recommend daily broad spectrum sunscreen SPF 30+ to sun-exposed areas, reapply every 2 hours as needed.  - Staying in the shade or wearing long sleeves, sun glasses (UVA+UVB protection) and wide brim hats (4-inch brim around the entire circumference of the hat) are also recommended for sun protection.  - Call for new or changing lesions.  LENTIGINES, SEBORRHEIC KERATOSES, HEMANGIOMAS - Benign normal skin lesions - Benign-appearing - Call for any changes  MELANOCYTIC NEVI - Tan-brown and/or pink-flesh-colored symmetric  macules and papules - Benign appearing on exam today - Observation - Call clinic for new or changing moles - Recommend daily use of broad spectrum spf 30+ sunscreen to sun-exposed areas.   EPIDERMAL INCLUSION CYST Exam: Subcutaneous nodule at L axilla  Benign-appearing. Exam most consistent with an epidermal inclusion cyst. Discussed that a cyst is a benign growth that can grow over time and sometimes get irritated or inflamed. Recommend observation if it is not bothersome. Discussed option of surgical excision to remove it if it is growing, symptomatic, or other changes noted. Please call for new or changing lesions so they can be evaluated.  History of Dysplastic Nevus Left upper back, 2008, mod - No evidence of recurrence today - Recommend regular full body skin exams - Recommend daily broad spectrum sunscreen SPF 30+ to sun-exposed areas, reapply every 2 hours as needed.  - Call if any new or changing lesions are noted between office visits INFLAMED SEBORRHEIC KERATOSIS (3) R shoulder and back x 3 (3) Symptomatic, irritating, patient would like treated.  Destruction of lesion - R shoulder and back x 3 (3) Complexity: simple   Destruction method: cryotherapy   Informed consent: discussed and consent obtained   Timeout:  patient name, date of birth, surgical site, and procedure verified Lesion destroyed using liquid nitrogen: Yes   Region frozen until ice ball extended beyond lesion: Yes   Outcome: patient tolerated procedure well with no complications   Post-procedure details: wound care instructions given     Return in about 1 year (around 02/16/2025) for TBSE - hx dysplastic nevus.  LILLETTE Rosina Mayans, CMA, am acting as scribe for Alm Rhyme, MD .   Documentation: I have reviewed  the above documentation for accuracy and completeness, and I agree with the above.  Alm Rhyme, MD

## 2024-02-23 ENCOUNTER — Encounter: Payer: Self-pay | Admitting: Dermatology

## 2024-03-07 DIAGNOSIS — Z794 Long term (current) use of insulin: Secondary | ICD-10-CM | POA: Diagnosis not present

## 2024-03-07 DIAGNOSIS — E11319 Type 2 diabetes mellitus with unspecified diabetic retinopathy without macular edema: Secondary | ICD-10-CM | POA: Diagnosis not present

## 2024-03-07 DIAGNOSIS — E785 Hyperlipidemia, unspecified: Secondary | ICD-10-CM | POA: Diagnosis not present

## 2024-03-07 DIAGNOSIS — E1169 Type 2 diabetes mellitus with other specified complication: Secondary | ICD-10-CM | POA: Diagnosis not present

## 2024-03-07 DIAGNOSIS — E1122 Type 2 diabetes mellitus with diabetic chronic kidney disease: Secondary | ICD-10-CM | POA: Diagnosis not present

## 2024-03-07 DIAGNOSIS — N1832 Chronic kidney disease, stage 3b: Secondary | ICD-10-CM | POA: Diagnosis not present

## 2024-03-07 DIAGNOSIS — E1129 Type 2 diabetes mellitus with other diabetic kidney complication: Secondary | ICD-10-CM | POA: Diagnosis not present

## 2024-03-07 DIAGNOSIS — E1159 Type 2 diabetes mellitus with other circulatory complications: Secondary | ICD-10-CM | POA: Diagnosis not present

## 2024-03-07 DIAGNOSIS — R809 Proteinuria, unspecified: Secondary | ICD-10-CM | POA: Diagnosis not present

## 2024-03-07 LAB — HEMOGLOBIN A1C: Hemoglobin A1C: 7.7

## 2024-03-09 ENCOUNTER — Ambulatory Visit: Payer: Self-pay

## 2024-03-09 ENCOUNTER — Ambulatory Visit: Admitting: Family Medicine

## 2024-03-09 ENCOUNTER — Encounter: Payer: Self-pay | Admitting: Family Medicine

## 2024-03-09 VITALS — BP 113/60 | HR 79 | Resp 16 | Wt 148.8 lb

## 2024-03-09 DIAGNOSIS — L03031 Cellulitis of right toe: Secondary | ICD-10-CM | POA: Diagnosis not present

## 2024-03-09 MED ORDER — SULFAMETHOXAZOLE-TRIMETHOPRIM 800-160 MG PO TABS: 2.0000 | ORAL_TABLET | Freq: Two times a day (BID) | ORAL | 0 refills | Status: DC

## 2024-03-09 NOTE — Progress Notes (Signed)
 Established patient visit   Patient: Margaret Payne   DOB: 08/23/1940   83 y.o. Female  MRN: [id number] Visit Date: 03/09/2024  Today's healthcare provider: Nancyann Perry, MD   Chief Complaint  Patient presents with   Insect Bite    got bit by a spider possibly.. red and swollen - warm Since Sunday night.   Subjective    Discussed the use of AI scribe software for clinical note transcription with the patient, who gave verbal consent to proceed.  History of Present Illness   Margaret Payne is an 83 year old female with diabetes who presents with a swollen and painful toe after a possible insect bite.  She experienced a burning sensation on her toe on Sunday night while sitting in her chair, covered up. She did not see what bit her but rubbing the area alleviated the burning.  On Monday, she visited her diabetic doctor for a regular appointment, during which her feet and legs were checked and no problems were found, according to her recollection. However, she noticed swelling in the toe just yesterday.  She is not aware of any allergies to antibiotics and is unsure if she has taken doxycycline  or sulfa  antibiotics before. She is concerned about potential interactions with her current medications, as she is on multiple medications for her diabetes.  She uses the [facility] on [address] for her prescriptions.       Medications: Outpatient Medications Prior to Visit  Medication Sig   acetaminophen  (TYLENOL ) 500 MG tablet Take 1,000 mg by mouth every 4 (four) hours as needed for mild pain.   alendronate  (FOSAMAX ) 70 MG tablet TAKE 1 TABLET BY MOUTH ONCE A WEEK ON  WEDNESDAY  WITH  A  FULL  GLASS  OF  WATER  ON  AN  EMPTY  STOMACH   aspirin  EC 81 MG EC tablet Take 1 tablet (81 mg total) by mouth daily. Swallow whole.   atorvastatin  (LIPITOR ) 80 MG tablet Take 1 tablet by mouth once daily   carvedilol  (COREG ) 3.125 MG tablet TAKE 1 TABLET BY MOUTH TWICE DAILY WITH MEALS    Cholecalciferol (VITAMIN D3) 5000 units TABS Take 5,000 Units by mouth daily.   dapagliflozin propanediol (FARXIGA) 10 MG TABS tablet Take 10 mg by mouth in the morning.   Dulaglutide 4.5 MG/0.5ML SOAJ Inject 4.5 mg into the skin.   ferrous sulfate  325 (65 FE) MG tablet Take 325 mg by mouth daily with breakfast.   insulin  lispro protamine-lispro (HUMALOG 75/25 MIX) (75-25) 100 UNIT/ML SUSP injection Inject 25-40 Units into the skin See admin instructions. 40 units in AM and 25 units in PM   lisinopril  (ZESTRIL ) 10 MG tablet Take 1 tablet (10 mg total) by mouth daily.   Multiple Vitamin (MULTI-VITAMINS) TABS Take 1 tablet by mouth daily.   omeprazole  (PRILOSEC  OTC) 20 MG tablet Take 40 mg by mouth daily.   Dulaglutide 3 MG/0.5ML SOPN Inject 3 mg into the skin every Thursday.   No facility-administered medications prior to visit.   Review of Systems     Objective    BP 113/60 (BP Location: Left Arm, Patient Position: Sitting, Cuff Size: Normal)   Pulse 79   Resp 16   Wt 148 lb 12.8 oz (67.5 kg)   SpO2 98%   BMI 29.06 kg/m   Physical Exam       Assessment & Plan        Cellulitis of right toe Acute  cellulitis with risk of rapid progression due to diabetes. - Prescribed Septra  DS, two pills twice daily with food. - Scheduled follow-up on Friday to assess improvement. - Advised to prevent toe friction with shoes, possibly using a bandage.     Return in about 2 days (around 03/11/2024).     Nancyann Perry, MD  [facility] [phone] (phone) [phone] (fax)  [facility]

## 2024-03-09 NOTE — Telephone Encounter (Signed)
 FYI Only or Action Required?: FYI only for provider: appointment scheduled on 03/09/24.  Patient was last seen in primary care on 10/16/2023 by Gasper Nancyann BRAVO, MD.  Called Nurse Triage reporting Insect Bite.  Symptoms began several days ago.  Interventions attempted: Nothing.  Symptoms are: unchanged.  Triage Disposition: See PCP When Office is Open (Within 3 Days)  Patient/caregiver understands and will follow disposition?: Yes  Copied from CRM #[id number]. Topic: Clinical - Red Word Triage >> Mar 09, 2024 11:24 AM Emylou G wrote: [facility] that prompted transfer to Nurse Triage: got bit by a spider possibly.. red and swollen - warm.. Reason for Disposition  Bite starts to look bad (e.g., blister, purplish skin, ulcer)  (Exception:  Just swelling or small red bump.)  Answer Assessment - Initial Assessment Questions 1. TYPE of SPIDER: What type of spider was it?  (e.g., name, unknown, or brief description)     unsure 2. LOCATION: Where is the bite located?      2nd digit, right foot 3. PAIN: Is there any pain? If Yes, ask: How bad is it?  (Scale 1-10; or mild, moderate, severe)     no 4. SWELLING: How big is the swelling? (Inches, cm or compare to coins)     swelling top of toe, redness to entire toe, warm to touch, denies drainage,red streaks,  fever, chills, n/v 5. ONSET: When did the bite occur? (e.g., minutes, hours ago)      Sunday  7. OTHER SYMPTOMS: Do you have any other symptoms?  (e.g., muscle cramps, abdomen pain, change in urine color)     No  Protocols used: Spider Bite - [employer]

## 2024-03-09 NOTE — Patient Instructions (Signed)
 SABRA  Please review the attached list of medications and notify my office if there are any errors.   . Please bring all of your medications to every appointment so we can make sure that our medication list is the same as yours.

## 2024-03-11 ENCOUNTER — Encounter: Payer: Self-pay | Admitting: Family Medicine

## 2024-03-11 ENCOUNTER — Ambulatory Visit (INDEPENDENT_AMBULATORY_CARE_PROVIDER_SITE_OTHER): Admitting: Family Medicine

## 2024-03-11 VITALS — BP 113/63 | HR 89 | Wt 150.0 lb

## 2024-03-11 DIAGNOSIS — L03031 Cellulitis of right toe: Secondary | ICD-10-CM

## 2024-03-11 NOTE — Progress Notes (Signed)
 Established patient visit   Patient: Margaret Payne   DOB: 08/09/1940   83 y.o. Female  MRN: [id number] Visit Date: 03/11/2024  Today's healthcare provider: Nancyann Perry, MD   Chief Complaint  Patient presents with   Follow-up    F/u of Cellulitis of toe of right foot   Subjective    Discussed the use of AI scribe software for clinical note transcription with the patient, who gave verbal consent to proceed.  History of Present Illness   Margaret Payne is an 83 year old female with diabetes who presents for follow up cellulitis right second toe.  She was first seen for this 2 days ago and started on Septra  DS 2 tablets twice a day.  She states that it is less tender than before, although it remains quite red. She denies any trouble with the antibiotic and mentions taking it with food. She is diabetic and is particularly careful with her footwear, stating she hasn't worn sandals in years. She is aware of the need to be cautious with her diet due to the absence of a gallbladder.       Medications: Outpatient Medications Prior to Visit  Medication Sig   acetaminophen  (TYLENOL ) 500 MG tablet Take 1,000 mg by mouth every 4 (four) hours as needed for mild pain.   alendronate  (FOSAMAX ) 70 MG tablet TAKE 1 TABLET BY MOUTH ONCE A WEEK ON  WEDNESDAY  WITH  A  FULL  GLASS  OF  WATER  ON  AN  EMPTY  STOMACH   aspirin  EC 81 MG EC tablet Take 1 tablet (81 mg total) by mouth daily. Swallow whole.   atorvastatin  (LIPITOR ) 80 MG tablet Take 1 tablet by mouth once daily   carvedilol  (COREG ) 3.125 MG tablet TAKE 1 TABLET BY MOUTH TWICE DAILY WITH MEALS   Cholecalciferol (VITAMIN D3) 5000 units TABS Take 5,000 Units by mouth daily.   dapagliflozin propanediol (FARXIGA) 10 MG TABS tablet Take 10 mg by mouth in the morning.   Dulaglutide 3 MG/0.5ML SOPN Inject 3 mg into the skin every Thursday.   Dulaglutide 4.5 MG/0.5ML SOAJ Inject 4.5 mg into the skin.   ferrous sulfate  325 (65 FE) MG tablet  Take 325 mg by mouth daily with breakfast.   insulin  lispro protamine-lispro (HUMALOG 75/25 MIX) (75-25) 100 UNIT/ML SUSP injection Inject 25-40 Units into the skin See admin instructions. 40 units in AM and 25 units in PM   lisinopril  (ZESTRIL ) 10 MG tablet Take 1 tablet (10 mg total) by mouth daily.   Multiple Vitamin (MULTI-VITAMINS) TABS Take 1 tablet by mouth daily.   omeprazole  (PRILOSEC  OTC) 20 MG tablet Take 40 mg by mouth daily.   sulfamethoxazole -trimethoprim  (BACTRIM  DS) 800-160 MG tablet Take 2 tablets by mouth 2 (two) times daily for 7 days.   No facility-administered medications prior to visit.   Review of Systems  Constitutional:  Negative for appetite change, chills, fatigue and fever.  Respiratory:  Negative for chest tightness and shortness of breath.   Cardiovascular:  Negative for chest pain and palpitations.  Gastrointestinal:  Negative for abdominal pain, nausea and vomiting.  Neurological:  Negative for dizziness and weakness.       Objective    BP 113/63 (BP Location: Right Arm, Patient Position: Sitting, Cuff Size: Normal)   Pulse 89   Wt 150 lb (68 kg)   SpO2 97%   BMI 29.29 kg/m   Physical Exam  Assessment & Plan        Cellulitis of toe Persistent redness slightly improved with reduced tenderness and swelling after 2 days Septra  DS. No progression into the foot. Improvement after four antibiotic doses. No adverse effects reported. - Continue antibiotics for 10-14 days. - Call early next week to assess antibiotic tolerance and effective before refill.  - Monitor for spreading infection or adverse effects.         Nancyann Perry, MD  [facility] [phone] (phone) [phone] (fax)  [facility]

## 2024-03-15 ENCOUNTER — Telehealth: Payer: Self-pay | Admitting: Family Medicine

## 2024-03-15 MED ORDER — SULFAMETHOXAZOLE-TRIMETHOPRIM 800-160 MG PO TABS: 2.0000 | ORAL_TABLET | Freq: Two times a day (BID) | ORAL | 0 refills | Status: DC

## 2024-03-15 NOTE — Telephone Encounter (Signed)
 Please check with patient and make sure she is doing Ok with the antibiotic prescribed last week for toe infection. If so then she needs to continue on medication for 1 more week. Prescription has been sent. Let me know if any problems with medication of if not continuing to improve.

## 2024-03-16 ENCOUNTER — Telehealth: Payer: Self-pay | Admitting: Family Medicine

## 2024-03-16 DIAGNOSIS — L03031 Cellulitis of right toe: Secondary | ICD-10-CM

## 2024-03-16 MED ORDER — DOXYCYCLINE HYCLATE 100 MG PO TABS: 100.0000 mg | ORAL_TABLET | Freq: Two times a day (BID) | ORAL | 0 refills | Status: AC

## 2024-03-16 NOTE — Telephone Encounter (Signed)
 Pt contacted office in regards to this concern. Telephone encounter sent to provider

## 2024-03-16 NOTE — Telephone Encounter (Signed)
 Called [facility] and canceled Bactrim  DS.

## 2024-03-16 NOTE — Telephone Encounter (Signed)
 Have sent prescription for doxycycline . Please advise and have pharmacy cancel the Bactrim  DS prescription.

## 2024-03-16 NOTE — Telephone Encounter (Signed)
 Copied from CRM #[id number]. Topic: Clinical - Medication Question >> Mar 16, 2024  8:02 AM Tiffini S wrote: Reason for CRM: Patient was prescribed sulfamethoxazole -trimethoprim  (BACTRIM  DS) 800-160 MG tablet for a spider bite- have taken one bottle and lose 4 pounds- she is not eating but spider bite looks better- would like to change the prescription to a different medication   Please call Lonell Misty the patient daughter at [phone] to discuss

## 2024-03-23 ENCOUNTER — Ambulatory Visit: Admitting: Emergency Medicine

## 2024-03-23 VITALS — Ht 60.0 in | Wt 145.0 lb

## 2024-03-23 DIAGNOSIS — Z0000 Encounter for general adult medical examination without abnormal findings: Secondary | ICD-10-CM | POA: Diagnosis not present

## 2024-03-23 NOTE — Patient Instructions (Signed)
 [name],  Thank you for taking the time for your Medicare Wellness Visit. I appreciate your continued commitment to your health goals. Please review the care plan we discussed, and feel free to reach out if I can assist you further.  Please note that Annual Wellness Visits do not include a physical exam. Some assessments may be limited, especially if the visit was conducted virtually. If needed, we may recommend an in-person follow-up with your provider.  Ongoing Care Seeing your primary care provider every 3 to 6 months helps us  monitor your health and provide consistent, personalized care.   Referrals If a referral was made during today's visit and you haven't received any updates within two weeks, please contact the referred provider directly to check on the status.  Recommended Screenings:  Keep up the good work!  Health Maintenance  Topic Date Due   DTaP/Tdap/Td vaccine (1 - Tdap) Never done   Zoster (Shingles) Vaccine (2 of 2) 01/24/2022   COVID-19 Vaccine (4 - 2025-26 season) 12/21/2023   Medicare Annual Wellness Visit  01/05/2024   Hemoglobin A1C  02/16/2024   Yearly kidney function blood test for diabetes  03/17/2024   Eye exam for diabetics  06/04/2024   Yearly kidney health urinalysis for diabetes  10/15/2024   Osteoporosis screening with Bone Density Scan  05/13/2025   Pneumococcal Vaccine for age over 15  Completed   Flu Shot  Completed   Meningitis B Vaccine  Aged Out       03/23/2024    9:04 AM  Advanced Directives  Does Patient Have a Medical Advance Directive? Yes  Type of Estate Agent of [place];Living will  Does patient want to make changes to medical advance directive? No - Patient declined  Copy of Healthcare Power of Attorney in Chart? Yes - validated most recent copy scanned in chart (See row information)    Vision: Annual vision screenings are recommended for early detection of glaucoma, cataracts, and diabetic retinopathy.  These exams can also reveal signs of chronic conditions such as diabetes and high blood pressure.  Dental: Annual dental screenings help detect early signs of oral cancer, gum disease, and other conditions linked to overall health, including heart disease and diabetes.  Please see the attached documents for additional preventive care recommendations.

## 2024-03-23 NOTE — Progress Notes (Signed)
 Chief Complaint  Patient presents with   Medicare Wellness     Subjective:   Margaret Payne is a 83 y.o. female who presents for a Medicare Annual Wellness Visit.  Visit info / Clinical Intake: Medicare Wellness Visit Type:: Subsequent Annual Wellness Visit Persons participating in visit and providing information:: patient Medicare Wellness Visit Mode:: Telephone If telephone:: video declined Since this visit was completed virtually, some vitals may be partially provided or unavailable. Missing vitals are due to the limitations of the virtual format.: Documented vitals are patient reported If Telephone or Video please confirm:: I connected with patient using audio/video enable telemedicine. I verified patient identity with two identifiers, discussed telehealth limitations, and patient agreed to proceed. Patient Location:: home Provider Location:: home Interpreter Needed?: No Pre-visit prep was completed: yes AWV questionnaire completed by patient prior to visit?: no Living arrangements:: (!) lives alone Patient's Overall Health Status Rating: very good Typical amount of pain: some Does pain affect daily life?: no Are you currently prescribed opioids?: no  Dietary Habits and Nutritional Risks How many meals a day?: 3 Eats fruit and vegetables daily?: yes Most meals are obtained by: preparing own meals In the last 2 weeks, have you had any of the following?: none Diabetic:: (!) yes (FBS today was 131 per patient) Any non-healing wounds?: no How often do you check your BS?: continuous glucose monitor Would you like to be referred to a Nutritionist or for Diabetic Management? : no  Functional Status Activities of Daily Living (to include ambulation/medication): Independent Ambulation: Independent with device- listed below Home Assistive Devices/Equipment: Other (Comment); Cane; Eyeglasses (hearing aids) Medication Administration: Independent Home Management (perform basic  housework or laundry): Independent Manage your own finances?: yes Primary transportation is: driving Concerns about vision?: no *vision screening is required for WTM* Concerns about hearing?: no (wears hearing aids)  Fall Screening Falls in the past year?: 0 Number of falls in past year: 0 Was there an injury with Fall?: 0 Fall Risk Category Calculator: 0 Patient Fall Risk Level: Low Fall Risk  Fall Risk Patient at Risk for Falls Due to: Impaired balance/gait; Impaired mobility Fall risk Follow up: Falls evaluation completed; Education provided; Falls prevention discussed  Home and Transportation Safety: All rugs have non-skid backing?: N/A, no rugs All stairs or steps have railings?: N/A, no stairs Grab bars in the bathtub or shower?: yes Have non-skid surface in bathtub or shower?: yes Good home lighting?: yes Regular seat belt use?: yes Hospital stays in the last year:: no  Cognitive Assessment Difficulty concentrating, remembering, or making decisions? : no Will 6CIT or Mini Cog be Completed: yes What year is it?: 0 points What month is it?: 0 points Give patient an address phrase to remember (5 components): [address] [place] About what time is it?: 0 points Count backwards from 20 to 1: 0 points Say the months of the year in reverse: 0 points Repeat the address phrase from earlier: 0 points 6 CIT Score: 0 points  Advance Directives (For Healthcare) Does Patient Have a Medical Advance Directive?: Yes Does patient want to make changes to medical advance directive?: No - Patient declined Type of Advance Directive: Healthcare Power of [place]; Living will Copy of Healthcare Power of Attorney in Chart?: Yes - validated most recent copy scanned in chart (See row information) Copy of Living Will in Chart?: Yes - validated most recent copy scanned in chart (See row information)  Reviewed/Updated  Reviewed/Updated: Reviewed All (Medical, Surgical, Family, Medications,  Allergies, Care Teams, Patient Goals)    Allergies (verified) Codeine sulfate   Current Medications (verified) Outpatient Encounter Medications as of 03/23/2024  Medication Sig   acetaminophen  (TYLENOL ) 500 MG tablet Take 1,000 mg by mouth every 4 (four) hours as needed for mild pain.   alendronate  (FOSAMAX ) 70 MG tablet TAKE 1 TABLET BY MOUTH ONCE A WEEK ON  WEDNESDAY  WITH  A  FULL  GLASS  OF  WATER  ON  AN  EMPTY  STOMACH   aspirin  EC 81 MG EC tablet Take 1 tablet (81 mg total) by mouth daily. Swallow whole.   atorvastatin  (LIPITOR ) 80 MG tablet Take 1 tablet by mouth once daily   carvedilol  (COREG ) 3.125 MG tablet TAKE 1 TABLET BY MOUTH TWICE DAILY WITH MEALS   Cholecalciferol (VITAMIN D3) 5000 units TABS Take 5,000 Units by mouth daily.   Continuous Glucose Sensor (FREESTYLE LIBRE 2 PLUS SENSOR) MISC 1 each by Other route every 14 (fourteen) days.   dapagliflozin propanediol (FARXIGA) 10 MG TABS tablet Take 10 mg by mouth in the morning.   doxycycline  (VIBRA -TABS) 100 MG tablet Take 1 tablet (100 mg total) by mouth 2 (two) times daily for 10 days.   Dulaglutide 4.5 MG/0.5ML SOAJ Inject 4.5 mg into the skin.   ferrous sulfate  325 (65 FE) MG tablet Take 325 mg by mouth daily with breakfast.   insulin  lispro protamine-lispro (HUMALOG 75/25 MIX) (75-25) 100 UNIT/ML SUSP injection Inject 25-40 Units into the skin See admin instructions. 40 units in AM and 25 units in PM   lisinopril  (ZESTRIL ) 10 MG tablet Take 1 tablet (10 mg total) by mouth daily.   Multiple Vitamin (MULTI-VITAMINS) TABS Take 1 tablet by mouth daily.   omeprazole  (PRILOSEC  OTC) 20 MG tablet Take 40 mg by mouth daily.   Dulaglutide 3 MG/0.5ML SOPN Inject 3 mg into the skin every Thursday. (Patient not taking: Reported on 03/23/2024)   No facility-administered encounter medications on file as of 03/23/2024.    History: Past Medical History:  Diagnosis Date   Acute cholecystitis 07/16/2022   Arthritis    CHF (congestive  heart failure) (HCC)    Chronic kidney disease    Diabetes mellitus without complication (HCC)    type 2   Headache    migraines.  None for 8-9 years   HOH (hard of hearing)    Hyperlipidemia    Hypertension    Non-ST elevation (NSTEMI) myocardial infarction (HCC) 02/2020   Wears dentures    Full upper.  no teeth on bottom   Wears hearing aid in both ears    Past Surgical History:  Procedure Laterality Date   ABDOMINAL HYSTERECTOMY  1986   BREAST BIOPSY     CARDIAC CATHETERIZATION     CATARACT EXTRACTION W/PHACO Right 10/03/2014   Procedure: CATARACT EXTRACTION PHACO AND INTRAOCULAR LENS PLACEMENT (IOC);  Surgeon: Elsie Carmine, MD;  Location: [facility] ORS;  Service: Ophthalmology;  Laterality: Right;  US  00:57 AP% 16.7 CDE 9.52 Fluid pack lot#[id number]   CATARACT EXTRACTION W/PHACO Left 10/07/2022   Procedure: CATARACT EXTRACTION PHACO AND INTRAOCULAR LENS PLACEMENT (IOC) LEFT DIABETIC  9.51  00:58.9;  Surgeon: Carmine Elsie, MD;  Location: [facility];  Service: Ophthalmology;  Laterality: Left;  Diabetic   COLONOSCOPY WITH PROPOFOL  N/A 05/02/2021   Procedure: COLONOSCOPY WITH PROPOFOL ;  Surgeon: Therisa Bi, MD;  Location: [facility] ENDOSCOPY;  Service: Gastroenterology;  Laterality: N/A;   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   Procedure: CORONARY STENT INTERVENTION;  Surgeon:  End, Lonni, MD;  Location: [facility] IN[facility]IVE CV [facility];  Service: Cardiovascular;  Laterality: N/A;   ESOPHAGOGASTRODUODENOSCOPY N/A 05/02/2021   Procedure: ESOPHAGOGASTRODUODENOSCOPY (EGD);  Surgeon: Therisa Bi, MD;  Location: [facility] ENDOSCOPY;  Service: Gastroenterology;  Laterality: N/A;   ESOPHAGOGASTRODUODENOSCOPY (EGD) WITH PROPOFOL  N/A 07/16/2021   Procedure: ESOPHAGOGASTRODUODENOSCOPY (EGD) WITH PROPOFOL ;  Surgeon: Therisa Bi, MD;  Location: [facility] ENDOSCOPY;  Service: Gastroenterology;  Laterality: N/A;   INJECTION KNEE     LEFT HEART CATH AND CORONARY ANGIOGRAPHY N/A 03/13/2020    Procedure: LEFT HEART CATH AND CORONARY ANGIOGRAPHY;  Surgeon: Mady Lonni, MD;  Location: [facility] INVASIVE CV LAB;  Service: Cardiovascular;  Laterality: N/A;   Family History  Problem Relation Age of Onset   Breast cancer Mother    Emphysema Father    Breast cancer Sister 62   Social History   Occupational History   Occupation: Retired  Tobacco Use   Smoking status: Never   Smokeless tobacco: Never  Vaping Use   Vaping status: Never Used  Substance and Sexual Activity   Alcohol use: No    Alcohol/week: 0.0 standard drinks of alcohol   Drug use: No   Sexual activity: Not on file   Tobacco Counseling Counseling given: Not Answered  SDOH Screenings   Food Insecurity: No Food Insecurity (03/23/2024)  Housing: Low Risk  (03/23/2024)  Transportation Needs: No Transportation Needs (03/23/2024)  Utilities: Not At Risk (03/23/2024)  Alcohol Screen: Low Risk  (01/05/2023)  Depression (PHQ2-9): Low Risk  (03/23/2024)  Financial Resource Strain: Low Risk  (01/05/2023)  Physical Activity: Sufficiently Active (03/23/2024)  Social Connections: Moderately Integrated (03/23/2024)  Stress: No Stress Concern Present (03/23/2024)  Tobacco Use: Low Risk  (03/23/2024)  Health Literacy: Adequate Health Literacy (03/23/2024)   See flowsheets for full screening details  Depression Screen PHQ 2 & 9 Depression Scale- Over the past 2 weeks, how often have you been bothered by any of the following problems? Little interest or pleasure in doing things: 0 Feeling down, depressed, or hopeless (PHQ Adolescent also includes...irritable): 0 PHQ-2 Total Score: 0 Trouble falling or staying asleep, or sleeping too much: 1 Feeling tired or having little energy: 1 Poor appetite or overeating (PHQ Adolescent also includes...weight loss): 1 Feeling bad about yourself - or that you are a failure or have let yourself or your family down: 0 Trouble concentrating on things, such as reading the newspaper or watching  television (PHQ Adolescent also includes...like school work): 0 Moving or speaking so slowly that other people could have noticed. Or the opposite - being so fidgety or restless that you have been moving around a lot more than usual: 0 Thoughts that you would be better off dead, or of hurting yourself in some way: 0 PHQ-9 Total Score: 3 If you checked off any problems, how difficult have these problems made it for you to do your work, take care of things at home, or get along with other people?: Not difficult at all  Depression Treatment Depression Interventions/Treatment : EYV7-0 Score <4 Follow-up Not Indicated     Goals Addressed               This Visit's Progress     do more sewing and make new friends (pt-stated)               Objective:    Today's Vitals   03/23/24 0857  Weight: 145 lb (65.8 kg)  Height: 5' (1.524 m)   Body mass index is 28.32 kg/m.  Hearing/Vision  screen Hearing Screening - Comments:: Wears hearing aids Vision Screening - Comments:: UTD w/ Dr. Jaye @ [facility] Immunizations and Health Maintenance Health Maintenance  Topic Date Due   DTaP/Tdap/Td (1 - Tdap) Never done   Zoster Vaccines- Shingrix  (2 of 2) 01/24/2022   COVID-19 Vaccine (4 - 2025-26 season) 12/21/2023   HEMOGLOBIN A1C  02/16/2024   Diabetic kidney evaluation - eGFR measurement  03/17/2024   OPHTHALMOLOGY EXAM  06/04/2024   Diabetic kidney evaluation - Urine ACR  10/15/2024   Medicare Annual Wellness (AWV)  03/23/2025   Bone Density Scan  05/13/2025   Pneumococcal Vaccine: 50+ Years  Completed   Influenza Vaccine  Completed   Meningococcal B Vaccine  Aged Out        Assessment/Plan:  This is a routine wellness examination for Margaret Payne.  Patient Care Team: Gasper Nancyann BRAVO, MD as PCP - General (Family Medicine) End, Lonni, MD as PCP - Cardiology (Cardiology) Damian Therisa HERO, MD as Physician Assistant (Endocrinology) Jaye Fallow, MD as Referring Physician  (Ophthalmology) Leora Lynwood SAUNDERS, MD as Consulting Physician (Orthopedic Surgery) Douglas Rule, MD as Consulting Physician (Nephrology) Hester Alm BROCKS, MD (Dermatology)  I have personally reviewed and noted the following in the patient's chart:   Medical and social history Use of alcohol, tobacco or illicit drugs  Current medications and supplements including opioid prescriptions. Functional ability and status Nutritional status Physical activity Advanced directives List of other physicians Hospitalizations, surgeries, and ER visits in previous 12 months Vitals Screenings to include cognitive, depression, and falls Referrals and appointments  No orders of the defined types were placed in this encounter.  In addition, I have reviewed and discussed with patient certain preventive protocols, quality metrics, and best practice recommendations. A written personalized care plan for preventive services as well as general preventive health recommendations were provided to patient.   Vina Ned, CMA   03/23/2024   Return in 53 weeks (on 03/29/2025).  After Visit Summary: (Mail) Due to this being a telephonic visit, the after visit summary with patients personalized plan was offered to patient via mail   Nurse Notes:  FBS this morning per patient was 131 Needs Tdap (pharmacy) Needs Hgb A1c at OV on 04/18/24 Shingrix  vaccine UTC per patient Declined Covid vaccine Declined DM & Nutrition education referral

## 2024-03-24 DIAGNOSIS — D631 Anemia in chronic kidney disease: Secondary | ICD-10-CM | POA: Diagnosis not present

## 2024-03-24 DIAGNOSIS — N2581 Secondary hyperparathyroidism of renal origin: Secondary | ICD-10-CM | POA: Diagnosis not present

## 2024-03-24 DIAGNOSIS — N1832 Chronic kidney disease, stage 3b: Secondary | ICD-10-CM | POA: Diagnosis not present

## 2024-03-24 DIAGNOSIS — E1122 Type 2 diabetes mellitus with diabetic chronic kidney disease: Secondary | ICD-10-CM | POA: Diagnosis not present

## 2024-03-24 DIAGNOSIS — R809 Proteinuria, unspecified: Secondary | ICD-10-CM | POA: Diagnosis not present

## 2024-03-31 DIAGNOSIS — I129 Hypertensive chronic kidney disease with stage 1 through stage 4 chronic kidney disease, or unspecified chronic kidney disease: Secondary | ICD-10-CM | POA: Diagnosis not present

## 2024-03-31 DIAGNOSIS — N1832 Chronic kidney disease, stage 3b: Secondary | ICD-10-CM | POA: Diagnosis not present

## 2024-03-31 DIAGNOSIS — N2581 Secondary hyperparathyroidism of renal origin: Secondary | ICD-10-CM | POA: Diagnosis not present

## 2024-03-31 DIAGNOSIS — Z794 Long term (current) use of insulin: Secondary | ICD-10-CM | POA: Diagnosis not present

## 2024-03-31 DIAGNOSIS — R809 Proteinuria, unspecified: Secondary | ICD-10-CM | POA: Diagnosis not present

## 2024-03-31 DIAGNOSIS — E1122 Type 2 diabetes mellitus with diabetic chronic kidney disease: Secondary | ICD-10-CM | POA: Diagnosis not present

## 2024-03-31 LAB — BASIC METABOLIC PANEL WITH GFR
BUN: 23 — AB (ref 4–21)
CO2: 22 (ref 13–22)
Chloride: 106 (ref 99–108)
Creatinine: 1.8 — AB (ref 0.5–1.1)
Glucose: 80
Potassium: 4.6 meq/L (ref 3.5–5.1)
Sodium: 138 (ref 137–147)

## 2024-03-31 LAB — COMPREHENSIVE METABOLIC PANEL WITH GFR: eGFR: 29

## 2024-04-18 ENCOUNTER — Ambulatory Visit (INDEPENDENT_AMBULATORY_CARE_PROVIDER_SITE_OTHER): Admitting: Family Medicine

## 2024-04-18 ENCOUNTER — Encounter: Payer: Self-pay | Admitting: Family Medicine

## 2024-04-18 VITALS — BP 135/58 | HR 82 | Resp 16 | Ht 60.0 in | Wt 149.0 lb

## 2024-04-18 DIAGNOSIS — E1121 Type 2 diabetes mellitus with diabetic nephropathy: Secondary | ICD-10-CM

## 2024-04-18 DIAGNOSIS — Z0000 Encounter for general adult medical examination without abnormal findings: Secondary | ICD-10-CM | POA: Diagnosis not present

## 2024-04-18 DIAGNOSIS — I5020 Unspecified systolic (congestive) heart failure: Secondary | ICD-10-CM | POA: Diagnosis not present

## 2024-04-18 DIAGNOSIS — I25118 Atherosclerotic heart disease of native coronary artery with other forms of angina pectoris: Secondary | ICD-10-CM | POA: Diagnosis not present

## 2024-04-18 DIAGNOSIS — N184 Chronic kidney disease, stage 4 (severe): Secondary | ICD-10-CM

## 2024-04-18 DIAGNOSIS — J069 Acute upper respiratory infection, unspecified: Secondary | ICD-10-CM

## 2024-04-18 DIAGNOSIS — Z794 Long term (current) use of insulin: Secondary | ICD-10-CM | POA: Diagnosis not present

## 2024-04-18 DIAGNOSIS — E1122 Type 2 diabetes mellitus with diabetic chronic kidney disease: Secondary | ICD-10-CM | POA: Diagnosis not present

## 2024-04-18 MED ORDER — PROMETHAZINE-DM 6.25-15 MG/5ML PO SYRP: 5.0000 mL | ORAL_SOLUTION | Freq: Four times a day (QID) | ORAL | 0 refills | Status: AC | PRN

## 2024-04-18 NOTE — Patient Instructions (Signed)
 SABRA  Please review the attached list of medications and notify my office if there are any errors.   . Please bring all of your medications to every appointment so we can make sure that our medication list is the same as yours.

## 2024-04-18 NOTE — Progress Notes (Signed)
 "    Complete physical exam   Patient: Margaret Payne   DOB: Sep 10, 1940   83 y.o. Female  MRN: [id number] Visit Date: 04/18/2024  Today's healthcare provider: Nancyann Perry, MD   Chief Complaint  Patient presents with   Annual Exam    CPE   Subjective    Discussed the use of AI scribe software for clinical note transcription with the patient, who gave verbal consent to proceed.  History of Present Illness   Margaret Payne is an 83 year old female with hypertension, hyperlipidemia, type two diabetes, and stage four chronic kidney disease who presents for a routine annual physical.  She has been experiencing a cough and nasal congestion for two days, primarily affecting her nose and throat. She has been using a twelve-hour cough medicine. No fever or difficulty breathing. The cough disrupted her sleep last night.  She has a history of hypertension, managed with lisinopril  and carvedilol . She is concerned about her blood pressure, noting a diastolic reading of 58, which she considers low. No dizziness or wooziness. She monitors her blood pressure at home and is concerned about potential medication adjustments.  Her type two diabetes is managed with Trulicity 4.5 mg once a week under the care of an endocrinologist. She mentions regaining weight lost during a recent course of antibiotics.  She has stage four chronic kidney disease and is followed by a nephrologist. Recent kidney function tests changed from 19 to 29 after discontinuing a sulfa  antibiotic that affected her appetite. A follow-up is scheduled for January 22.  She mentions a past issue with her toe, previously treated with antibiotics. The toe is no longer swollen or painful, though the color remains slightly off.  She reports ankle swelling, which she associates with recent dietary choices, including large breakfasts and salty foods. She is mindful of her diet due to the absence of a gallbladder, avoiding fried foods and  certain vegetables like broccoli, which cause digestive issues.     Lab Results  Component Value Date   HGBA1C 7.7 03/07/2024   HGBA1C 7.6 08/17/2023   HGBA1C 7.9 02/05/2023   Lab Results  Component Value Date   NA 138 03/31/2024   K 4.6 03/31/2024   CREATININE 1.8 (A) 03/31/2024   EGFR 29 03/31/2024   GLUCOSE 214 (H) 07/18/2022   Lab Results  Component Value Date   CHOL 117 02/05/2023   HDL 42 02/05/2023   LDLCALC 37 02/05/2023   TRIG 188 (A) 02/05/2023   CHOLHDL 4.4 03/13/2020    HPI     Annual Exam    Additional comments: CPE      Last edited by Marylen Odella CROME, CMA on 04/18/2024  8:59 AM.       Past Medical History:  Diagnosis Date   Acute cholecystitis 07/16/2022   Arthritis    CHF (congestive heart failure) (HCC)    Chronic kidney disease    Diabetes mellitus without complication (HCC)    type 2   Headache    migraines.  None for 8-9 years   HOH (hard of hearing)    Hyperlipidemia    Hypertension    Non-ST elevation (NSTEMI) myocardial infarction (HCC) 02/2020   Wears dentures    Full upper.  no teeth on bottom   Wears hearing aid in both ears    Past Surgical History:  Procedure Laterality Date   ABDOMINAL HYSTERECTOMY  1986   BREAST BIOPSY     CARDIAC CATHETERIZATION  CATARACT EXTRACTION W/PHACO Right 10/03/2014   Procedure: CATARACT EXTRACTION PHACO AND INTRAOCULAR LENS PLACEMENT (IOC);  Surgeon: Elsie Carmine, MD;  Location: [facility] ORS;  Service: Ophthalmology;  Laterality: Right;  US  00:57 AP% 16.7 CDE 9.52 Fluid pack lot#[id number]   CATARACT EXTRACTION W/PHACO Left 10/07/2022   Procedure: CATARACT EXTRACTION PHACO AND INTRAOCULAR LENS PLACEMENT (IOC) LEFT DIABETIC  9.51  00:58.9;  Surgeon: Carmine Elsie, MD;  Location: [facility];  Service: Ophthalmology;  Laterality: Left;  Diabetic   COLONOSCOPY WITH PROPOFOL  N/A 05/02/2021   Procedure: COLONOSCOPY WITH PROPOFOL ;  Surgeon: Therisa Bi, MD;  Location: [facility] ENDOSCOPY;   Service: Gastroenterology;  Laterality: N/A;   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   Procedure: CORONARY STENT INTERVENTION;  Surgeon: Mady Bruckner, MD;  Location: [facility] INVASIVE CV LAB;  Service: Cardiovascular;  Laterality: N/A;   ESOPHAGOGASTRODUODENOSCOPY N/A 05/02/2021   Procedure: ESOPHAGOGASTRODUODENOSCOPY (EGD);  Surgeon: Therisa Bi, MD;  Location: [facility] ENDOSCOPY;  Service: Gastroenterology;  Laterality: N/A;   ESOPHAGOGASTRODUODENOSCOPY (EGD) WITH PROPOFOL  N/A 07/16/2021   Procedure: ESOPHAGOGASTRODUODENOSCOPY (EGD) WITH PROPOFOL ;  Surgeon: Therisa Bi, MD;  Location: [facility] ENDOSCOPY;  Service: Gastroenterology;  Laterality: N/A;   INJECTION KNEE     LEFT HEART CATH AND CORONARY ANGIOGRAPHY N/A 03/13/2020   Procedure: LEFT HEART CATH AND CORONARY ANGIOGRAPHY;  Surgeon: Mady Bruckner, MD;  Location: [facility] INVASIVE CV LAB;  Service: Cardiovascular;  Laterality: N/A;   Social History   Socioeconomic History   Marital status: Divorced    Spouse name: Not on file   Number of children: 1   Years of education: Not on file   Highest education level: Associate degree: occupational, scientist, product/process development, or vocational program  Occupational History   Occupation: Retired  Tobacco Use   Smoking status: Never   Smokeless tobacco: Never  Vaping Use   Vaping status: Never Used  Substance and Sexual Activity   Alcohol use: No    Alcohol/week: 0.0 standard drinks of alcohol   Drug use: No   Sexual activity: Not on file  Other Topics Concern   Not on file  Social History Narrative   Not on file   Social Drivers of Health   Tobacco Use: Low Risk (04/18/2024)   Patient History    Smoking Tobacco Use: Never    Smokeless Tobacco Use: Never    Passive Exposure: Not on file  Financial Resource Strain: Low Risk (01/05/2023)   Overall Financial Resource Strain (CARDIA)    Difficulty of Paying Living Expenses: Not hard at all  Food Insecurity: No Food Insecurity (03/23/2024)   Epic     Worried About Radiation Protection Practitioner of Food in the Last Year: Never true    Ran Out of Food in the Last Year: Never true  Transportation Needs: No Transportation Needs (03/23/2024)   Epic    Lack of Transportation (Medical): No    Lack of Transportation (Non-Medical): No  Physical Activity: Sufficiently Active (03/23/2024)   Exercise Vital Sign    Days of Exercise per Week: 6 days    Minutes of Exercise per Session: 30 min  Stress: No Stress Concern Present (03/23/2024)   [employer] of Occupational Health - Occupational Stress Questionnaire    Feeling of Stress: Only a little  Social Connections: Moderately Integrated (03/23/2024)   Social Connection and Isolation Panel    Frequency of Communication with Friends and Family: More than three times a week    Frequency of Social Gatherings with Friends and Family: More than three times a week  Attends Religious Services: More than 4 times per year    Active Member of Clubs or Organizations: Yes    Attends Banker Meetings: More than 4 times per year    Marital Status: Divorced  Intimate Partner Violence: Not At Risk (03/23/2024)   Epic    Fear of Current or Ex-Partner: No    Emotionally Abused: No    Physically Abused: No    Sexually Abused: No  Depression (PHQ2-9): Low Risk (03/23/2024)   Depression (PHQ2-9)    PHQ-2 Score: 3  Alcohol Screen: Low Risk (01/05/2023)   Alcohol Screen    Last Alcohol Screening Score (AUDIT): 0  Housing: Low Risk (03/23/2024)   Epic    Unable to Pay for Housing in the Last Year: No    Number of Times Moved in the Last Year: 0    Homeless in the Last Year: No  Utilities: Not At Risk (03/23/2024)   Epic    Threatened with loss of utilities: No  Health Literacy: Adequate Health Literacy (03/23/2024)   B1300 Health Literacy    Frequency of need for help with medical instructions: Never   Family Status  Relation Name Status   Mother  Deceased at age 36       old age   Father  Deceased   Sister  38 Deceased       died from respiratory issues   Daughter  Alive  No partnership data on file   Family History  Problem Relation Age of Onset   Breast cancer Mother    Emphysema Father    Breast cancer Sister 18   Allergies[1]  Patient Care Team: Gasper Nancyann BRAVO, MD as PCP - General (Family Medicine) End, Lonni, MD as PCP - Cardiology (Cardiology) Solum, Therisa HERO, MD as Physician Assistant (Endocrinology) Jaye Fallow, MD as Referring Physician (Ophthalmology) Leora Lynwood SAUNDERS, MD as Consulting Physician (Orthopedic Surgery) Douglas Rule, MD as Consulting Physician (Nephrology) Hester Alm BROCKS, MD (Dermatology) End, Lonni, MD as Consulting Physician (Cardiology)   Medications: Show/hide medication list[2]  Review of Systems    Objective    BP (!) 135/58 (BP Location: Left Arm, Patient Position: Sitting, Cuff Size: Normal)   Pulse 82   Resp 16   Ht 5' (1.524 m)   Wt 149 lb (67.6 kg)   SpO2 98%   BMI 29.10 kg/m    Physical Exam  General Appearance:    Well developed, well nourished female. Alert, cooperative, in no acute distress, appears stated age   Head:    Normocephalic, without obvious abnormality, atraumatic  Eyes:    PERRL, conjunctiva/corneas clear, EOM's intact, fundi    benign, both eyes  Ears:    Normal TM's and external ear canals, both ears  Nose:   Nasal turbinates boggy with clear discharge  Throat:   Lips, mucosa, and tongue normal; teeth and gums normal. Clear post nasal drainage.   Neck:   Supple, symmetrical, trachea midline, no adenopathy;    thyroid:  no enlargement/tenderness/nodules; no carotid   bruit or JVD  Back:     Symmetric, no curvature, ROM normal, no CVA tenderness  Lungs:     Clear to auscultation bilaterally, respirations unlabored  Chest Wall:    No tenderness or deformity   Heart:    Normal heart rate. Normal rhythm. II/VI systolic murmur RUSB  Breast Exam:    deferred  Abdomen:     Soft, non-tender, bowel  sounds active all four quadrants,  no masses, no organomegaly  Pelvic:    deferred  Extremities:   All extremities are intact. No cyanosis or edema  Pulses:   2+ and symmetric all extremities  Skin:   Skin color, texture, turgor normal, no rashes or lesions  Lymph nodes:   Cervical, supraclavicular, and axillary nodes normal  Neurologic:   CNII-XII intact, normal strength, sensation and reflexes    throughout    Last depression screening scores    03/23/2024    9:13 AM 10/16/2023   11:19 AM 01/05/2023   11:26 AM  PHQ 2/9 Scores  PHQ - 2 Score 0 0 0  PHQ- 9 Score 3     Last fall risk screening    03/23/2024    9:04 AM  Fall Risk   Falls in the past year? 0  Number falls in past yr: 0  Injury with Fall? 0  Risk for fall due to : Impaired balance/gait;Impaired mobility  Follow up Falls evaluation completed;Education provided;Falls prevention discussed   Last Audit-C alcohol use screening    01/05/2023   11:25 AM  Alcohol Use Disorder Test (AUDIT)  1. How often do you have a drink containing alcohol? 0  2. How many drinks containing alcohol do you have on a typical day when you are drinking? 0  3. How often do you have six or more drinks on one occasion? 0  AUDIT-C Score 0   A score of 3 or more in women, and 4 or more in men indicates increased risk for alcohol abuse, EXCEPT if all of the points are from question 1   Results for orders placed or performed in visit on 04/18/24  Basic metabolic panel with GFR  Result Value Ref Range   Glucose 80    BUN 23 (A) 4 - 21   CO2 22 13 - 22   Creatinine 1.8 (A) 0.5 - 1.1   Potassium 4.6 3.5 - 5.1 mEq/L   Sodium 138 137 - 147   Chloride 106 99 - 108  Comprehensive metabolic panel with GFR  Result Value Ref Range   eGFR 29   Hemoglobin A1c  Result Value Ref Range   Hemoglobin A1C 7.7     Assessment & Plan    Routine Health Maintenance and Physical Exam  Exercise Activities and Dietary recommendations  Goals       do  more sewing and make new friends (pt-stated)        Immunization History  Administered Date(s) Administered   Fluad Quad(high Dose 65+) 01/28/2019, 02/10/2024   INFLUENZA, HIGH DOSE SEASONAL PF 02/23/2016, 02/15/2018, 02/24/2023   Influenza,inj,Quad PF,6+ Mos 01/23/2017, 02/12/2021   Influenza-Unspecified 01/30/2020   Moderna Sars-Covid-2 Vaccination 06/18/2019, 07/16/2019, 02/21/2020   Pneumococcal Conjugate-13 10/11/2015   Pneumococcal Polysaccharide-23 11/10/2017   Respiratory Syncytial Virus Vaccine,Recomb Aduvanted(Arexvy) 02/22/2022   Zoster Recombinant(Shingrix ) 11/29/2021   Zoster, Live 04/17/2008    Health Maintenance  Topic Date Due   DTaP/Tdap/Td (1 - Tdap) Never done   Zoster Vaccines- Shingrix  (2 of 2) 01/24/2022   COVID-19 Vaccine (4 - 2025-26 season) 12/21/2023   OPHTHALMOLOGY EXAM  06/04/2024   HEMOGLOBIN A1C  09/04/2024   Diabetic kidney evaluation - Urine ACR  10/15/2024   Medicare Annual Wellness (AWV)  03/23/2025   Diabetic kidney evaluation - eGFR measurement  03/31/2025   Bone Density Scan  05/13/2025   Pneumococcal Vaccine: 50+ Years  Completed   Influenza Vaccine  Completed   Meningococcal B Vaccine  Aged Out  Discussed health benefits of physical activity, and encouraged her to engage in regular exercise appropriate for her age and condition.     Acute upper respiratory infection Cough and nasal congestion likely due to a cold virus. Lungs clear, indicating throat drainage as cough cause. - Prescribed cough syrup. - Advised to report any difficulty breathing or high fevers.  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 Kidney function improved from 19 to 29, likely due to antibiotics. - Continue Trulicity 4.5 mg weekly. - Follow up with endocrinologist as scheduled.  Chronic kidney disease, stage 4 Kidney function improved from 19 to 29, likely due to antibiotics. Follow-up with nephrologist scheduled. -  Continue follow-up with nephrologist on January 22nd.  Hypertension Blood pressure slightly low diastolic at 58, systolic slightly elevated. - Continue current antihypertensive regimen with lisinopril  and carvedilol . - Monitor blood pressure at home.  Hyperlipidemia  General Health Maintenance Received flu shot this year.     Coronary artery disease of native artery of native heart with stable angina pectoris Asymptomatic. Compliant with medication.  Continue aggressive risk factor modification.  Continue routine follow up cardiology   Heart failure with improved ejection fraction (HFimpEF) (HCC) Asymptomatic. Compliant with medication.  Continue aggressive risk factor modification.  Continue routine follow up cardiology  Viral URI with cough - promethazine-dextromethorphan (PROMETHAZINE-DM) 6.25-15 MG/5ML syrup; Take 5 mLs by mouth 4 (four) times daily as needed for up to 10 days for cough.  Dispense: 118 mL; Refill: 0   Return in about 6 months (around 10/17/2024) for Hypertension, Diabetes.        Nancyann Perry, MD  [facility] [phone] (phone) [phone] (fax)  [facility]     [1]  Allergies Allergen Reactions   Codeine Sulfate Nausea Only  [2]  Outpatient Medications Prior to Visit  Medication Sig Note   acetaminophen  (TYLENOL ) 500 MG tablet Take 1,000 mg by mouth every 4 (four) hours as needed for mild pain.    alendronate  (FOSAMAX ) 70 MG tablet TAKE 1 TABLET BY MOUTH ONCE A WEEK ON  WEDNESDAY  WITH  A  FULL  GLASS  OF  WATER  ON  AN  EMPTY  STOMACH    aspirin  EC 81 MG EC tablet Take 1 tablet (81 mg total) by mouth daily. Swallow whole.    atorvastatin  (LIPITOR ) 80 MG tablet Take 1 tablet by mouth once daily    carvedilol  (COREG ) 3.125 MG tablet TAKE 1 TABLET BY MOUTH TWICE DAILY WITH MEALS    Cholecalciferol (VITAMIN D3) 5000 units TABS Take 5,000 Units by mouth daily.    Continuous Glucose Sensor (FREESTYLE LIBRE  2 PLUS SENSOR) MISC 1 each by Other route every 14 (fourteen) days.    Dulaglutide 4.5 MG/0.5ML SOAJ Inject 4.5 mg into the skin.    ferrous sulfate  325 (65 FE) MG tablet Take 325 mg by mouth daily with breakfast.    insulin  lispro protamine-lispro (HUMALOG 75/25 MIX) (75-25) 100 UNIT/ML SUSP injection Inject 25-40 Units into the skin See admin instructions. 40 units in AM and 25 units in PM    lisinopril  (ZESTRIL ) 10 MG tablet Take 1 tablet (10 mg total) by mouth daily.    Multiple Vitamin (MULTI-VITAMINS) TABS Take 1 tablet by mouth daily.    omeprazole  (PRILOSEC  OTC) 20 MG tablet Take 40 mg by mouth daily.    [DISCONTINUED] Dulaglutide 3 MG/0.5ML SOPN Inject 3 mg into the skin every Thursday. (Patient not taking: Reported on 04/18/2024)  04/18/2024: was increased to 4.5 by Dr. Leartis   No facility-administered medications prior to visit.   "

## 2024-05-19 ENCOUNTER — Other Ambulatory Visit: Payer: Self-pay | Admitting: Internal Medicine

## 2024-05-19 DIAGNOSIS — I2510 Atherosclerotic heart disease of native coronary artery without angina pectoris: Secondary | ICD-10-CM

## 2024-05-19 DIAGNOSIS — I255 Ischemic cardiomyopathy: Secondary | ICD-10-CM

## 2024-10-17 ENCOUNTER — Ambulatory Visit: Admitting: Family Medicine

## 2025-02-15 ENCOUNTER — Ambulatory Visit: Admitting: Dermatology

## 2025-03-29 ENCOUNTER — Ambulatory Visit
